# Patient Record
Sex: Female | Born: 1985 | Race: Black or African American | Hispanic: No | Marital: Single | Smoking: Never smoker
Health system: Southern US, Community
[De-identification: ages and names within clinical notes are randomized; demographics above are authoritative.]

## PROBLEM LIST (undated history)

## (undated) ENCOUNTER — Inpatient Hospital Stay (HOSPITAL_COMMUNITY): Payer: Self-pay

## (undated) DIAGNOSIS — Z789 Other specified health status: Secondary | ICD-10-CM

## (undated) DIAGNOSIS — I341 Nonrheumatic mitral (valve) prolapse: Secondary | ICD-10-CM

## (undated) DIAGNOSIS — Z3490 Encounter for supervision of normal pregnancy, unspecified, unspecified trimester: Secondary | ICD-10-CM

## (undated) DIAGNOSIS — D649 Anemia, unspecified: Secondary | ICD-10-CM

## (undated) DIAGNOSIS — O361990 Maternal care for other isoimmunization, unspecified trimester, not applicable or unspecified: Secondary | ICD-10-CM

## (undated) DIAGNOSIS — I4891 Unspecified atrial fibrillation: Secondary | ICD-10-CM

## (undated) DIAGNOSIS — Q210 Ventricular septal defect: Secondary | ICD-10-CM

## (undated) DIAGNOSIS — D696 Thrombocytopenia, unspecified: Secondary | ICD-10-CM

## (undated) HISTORY — DX: Anemia, unspecified: D64.9

## (undated) HISTORY — DX: Unspecified atrial fibrillation: I48.91

---

## 2012-08-11 ENCOUNTER — Emergency Department (HOSPITAL_COMMUNITY)
Admission: EM | Admit: 2012-08-11 | Discharge: 2012-08-11 | Disposition: A | Discharge status: Home / Self Care | Payer: Medicaid Other | Attending: Emergency Medicine | Admitting: Emergency Medicine

## 2012-08-11 ENCOUNTER — Encounter (HOSPITAL_COMMUNITY): Payer: Self-pay

## 2012-08-11 DIAGNOSIS — S51809A Unspecified open wound of unspecified forearm, initial encounter: Secondary | ICD-10-CM | POA: Insufficient documentation

## 2012-08-11 DIAGNOSIS — S51819A Laceration without foreign body of unspecified forearm, initial encounter: Secondary | ICD-10-CM

## 2012-08-11 HISTORY — DX: Nonrheumatic mitral (valve) prolapse: I34.1

## 2012-08-11 MED ORDER — LIDOCAINE HCL (PF) 1 % IJ SOLN
INTRAMUSCULAR | Status: AC
  Filled 2012-08-11: qty 5

## 2012-08-11 MED ORDER — LIDOCAINE-EPINEPHRINE 2 %-1:100000 IJ SOLN
20.0000 mL | Freq: Once | INTRAMUSCULAR | Status: AC
  Administered 2012-08-11: 20 mL via INTRADERMAL
  Filled 2012-08-11: qty 20

## 2012-08-11 NOTE — ED Notes (Signed)
D/c papers given to patient. [employer] asked [place] to hold pt until they talk to her again.

## 2012-08-11 NOTE — ED Notes (Signed)
MD at bedside to suture.

## 2012-08-11 NOTE — ED Notes (Signed)
Antibiotic ointment applied and wrapped with kling. [employer] at bedside
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
## 2012-08-11 NOTE — ED Provider Notes (Signed)
Medical screening examination/treatment/procedure(s) were conducted as a shared visit with non-physician practitioner(s) and myself.  I personally evaluated the patient during the encounter   Celene Kras, MD 08/11/12 1100

## 2012-08-11 NOTE — ED Provider Notes (Signed)
  Physical Exam  BP 137/101  Pulse 73  Temp 97.6 F (36.4 C) (Oral)  Resp 20  SpO2 100%  LMP 07/18/2012  Physical Exam  ED Course  Procedures  LACERATION REPAIR Performed by: Carlyle Dolly Authorized by: Carlyle Dolly Consent: Verbal consent obtained. Risks and benefits: risks, benefits and alternatives were discussed Consent given by: patient Patient identity confirmed: provided demographic data Prepped and Draped in normal sterile fashion Wound explored  Laceration Location: R forearm  Laceration Length: 3.5cm  No Foreign Bodies seen or palpated  Anesthesia: local infiltration  Local anesthetic: lidocaine 2% w epinephrine  Anesthetic total: 6ml  Irrigation method: syringe Amount of cleaning: standard  Skin closure: 4-0 Prolene   Number of sutures: 7  Technique: Simple Interrupted  Patient tolerance: Patient tolerated the procedure well with no immediate complications.       Carlyle Dolly, PA-C 08/11/12 1056
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
## 2012-08-23 ENCOUNTER — Encounter (HOSPITAL_COMMUNITY): Payer: Self-pay | Admitting: Emergency Medicine

## 2012-08-23 ENCOUNTER — Emergency Department (HOSPITAL_COMMUNITY)
Admission: EM | Admit: 2012-08-23 | Discharge: 2012-08-23 | Disposition: A | Discharge status: Home / Self Care | Payer: Medicaid Other | Attending: Emergency Medicine | Admitting: Emergency Medicine

## 2012-08-23 DIAGNOSIS — S51819A Laceration without foreign body of unspecified forearm, initial encounter: Secondary | ICD-10-CM

## 2012-08-23 DIAGNOSIS — X58XXXA Exposure to other specified factors, initial encounter: Secondary | ICD-10-CM | POA: Insufficient documentation

## 2012-08-23 DIAGNOSIS — S51809A Unspecified open wound of unspecified forearm, initial encounter: Secondary | ICD-10-CM | POA: Insufficient documentation

## 2012-08-23 DIAGNOSIS — Z4802 Encounter for removal of sutures: Secondary | ICD-10-CM

## 2012-08-23 DIAGNOSIS — I059 Rheumatic mitral valve disease, unspecified: Secondary | ICD-10-CM | POA: Insufficient documentation
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
## 2012-08-23 NOTE — ED Notes (Signed)
Needs sutures out rt forearm areaa well healed no s/s of infection

## 2012-08-25 NOTE — ED Provider Notes (Signed)
History    history per patient. Patient presents for suture removal of her right forearm. Sutures placed 08/11/2012 after laceration. Sutures are placed here in the emergency room. No history of fever or drainage spreading redness. No further pain. No other modifying factors identified. Patient taking no other medications. No other risk factors identified.   CSN: [id number]  Arrival date & time 08/23/12  1223   First MD Initiated Contact with Patient 08/23/12 1252      Chief Complaint  Patient presents with  . Suture / Staple Removal    (Consider location/radiation/quality/duration/timing/severity/associated sxs/prior treatment) HPI  Past Medical History  Diagnosis Date  . MVP (mitral valve prolapse)     No past surgical history on file.  No family history on file.  History  Substance Use Topics  . Smoking status: Never Smoker   . Smokeless tobacco: Not on file  . Alcohol Use: No    OB History    Grav Para Term Preterm Abortions TAB SAB Ect Mult Living                  Review of Systems  All other systems reviewed and are negative.    Allergies  Review of patient's allergies indicates no known allergies.  Home Medications  No current outpatient prescriptions on file.  BP 112/53  Pulse 65  Temp 98.4 F (36.9 C) (Oral)  Resp 12  SpO2 98%  LMP 07/18/2012  Physical Exam  Constitutional: She is oriented to person, place, and time. She appears well-developed and well-nourished.  HENT:  Head: Normocephalic.  Right Ear: External ear normal.  Left Ear: External ear normal.  Nose: Nose normal.  Mouth/Throat: Oropharynx is clear and moist.  Eyes: EOM are normal. Pupils are equal, round, and reactive to light. Right eye exhibits no discharge. Left eye exhibits no discharge.  Neck: Normal range of motion. Neck supple. No tracheal deviation present.       No nuchal rigidity no meningeal signs  Cardiovascular: Normal rate and regular rhythm.   Pulmonary/Chest:  Effort normal and breath sounds normal. No stridor. No respiratory distress. She has no wheezes. She has no rales.  Abdominal: Soft. She exhibits no distension and no mass. There is no tenderness. There is no rebound and no guarding.  Musculoskeletal: Normal range of motion. She exhibits no edema and no tenderness.       6 sutures present in right forearm no induration fluctuance tenderness  Neurological: She is alert and oriented to person, place, and time. She has normal reflexes. No cranial nerve deficit. Coordination normal.  Skin: Skin is warm. No rash noted. She is not diaphoretic. No erythema. No pallor.       No pettechia no purpura    ED Course  Procedures (including critical care time)  Labs Reviewed - No data to display No results found.   1. Visit for suture removal   2. Forearm laceration       MDM  Sutures removed per my note. No induration fluctuance tenderness spreading erythema or history of fever to suggest infection we'll discharge home patient agrees with plan.  Chart reviewed from past visist SUTURE REMOVAL Performed by: Arley Phenix  Consent: Verbal consent obtained. Patient identity confirmed: provided demographic data Time out: Immediately prior to procedure a "time out" was called to verify the correct patient, procedure, equipment, support staff and site/side marked as required.  Location details: rigth forearm  Wound Appearance: clean  Sutures/Staples Removed: 6  Facility: sutures placed  in this facility Patient tolerance: Patient tolerated the procedure well with no immediate complications.          Arley Phenix, MD 08/25/12 [phone]

## 2012-12-14 ENCOUNTER — Emergency Department (HOSPITAL_COMMUNITY)
Admission: EM | Admit: 2012-12-14 | Discharge: 2012-12-14 | Disposition: A | Discharge status: Home / Self Care | Payer: Medicaid Other | Attending: Emergency Medicine | Admitting: Emergency Medicine

## 2012-12-14 ENCOUNTER — Encounter (HOSPITAL_COMMUNITY): Payer: Self-pay | Admitting: Emergency Medicine

## 2012-12-14 DIAGNOSIS — L299 Pruritus, unspecified: Secondary | ICD-10-CM | POA: Insufficient documentation

## 2012-12-14 DIAGNOSIS — L42 Pityriasis rosea: Secondary | ICD-10-CM | POA: Insufficient documentation

## 2012-12-14 DIAGNOSIS — Z8679 Personal history of other diseases of the circulatory system: Secondary | ICD-10-CM | POA: Insufficient documentation

## 2012-12-14 NOTE — ED Provider Notes (Signed)
Medical screening examination/treatment/procedure(s) were performed by non-physician practitioner and as supervising physician I was immediately available for consultation/collaboration.  Doug Sou, MD 12/14/12 [phone]

## 2012-12-14 NOTE — ED Notes (Signed)
Voiced understanding of instructions given 

## 2012-12-14 NOTE — ED Notes (Signed)
States that she began having rash break out 3 days ago and complains of itching. States that it is all over her body. Kids present with her and she states that they do not have symptoms.

## 2012-12-14 NOTE — ED Provider Notes (Signed)
History     CSN: [id number]  Arrival date & time 12/14/12  1109   First MD Initiated Contact with Patient 12/14/12 1115      Chief Complaint  Patient presents with  . Rash    (Consider location/radiation/quality/duration/timing/severity/associated sxs/prior treatment) HPI Comments: Patient reports that she first noticed a large scaly area on her chest.  A couple of days later she developed additional smaller scaly areas on the trunk, upper legs, and upper arms.  No contacts with similar rash.    Patient is a 27 y.o. female presenting with rash. The history is provided by the patient.  Rash  This is a new problem. Episode onset: 3 days ago. The problem has been gradually worsening. Associated with: No new soaps, detergents, medications, or lotions. There has been no fever. Associated symptoms include itching. Pertinent negatives include no blisters, no pain and no weeping. Treatments tried: calamine lotion.    Past Medical History  Diagnosis Date  . MVP (mitral valve prolapse)     History reviewed. No pertinent past surgical history.  No family history on file.  History  Substance Use Topics  . Smoking status: Never Smoker   . Smokeless tobacco: Not on file  . Alcohol Use: No    OB History    Grav Para Term Preterm Abortions TAB SAB Ect Mult Living                  Review of Systems  Constitutional: Negative for fever and chills.  Gastrointestinal: Negative for nausea and vomiting.  Skin: Positive for itching and rash.  Neurological: Negative for headaches.  All other systems reviewed and are negative.    Allergies  Review of patient's allergies indicates no known allergies.  Home Medications  No current outpatient prescriptions on file.  BP 109/64  Pulse 82  Temp 98.4 F (36.9 C) (Oral)  Resp 17  SpO2 100%  LMP 11/10/2012  Physical Exam  Nursing note and vitals reviewed. Constitutional: She appears well-developed and well-nourished. No distress.    HENT:  Head: Normocephalic and atraumatic.  Mouth/Throat: Oropharynx is clear and moist.  Neck: Normal range of motion. Neck supple.  Cardiovascular: Normal rate, regular rhythm and normal heart sounds.   Pulmonary/Chest: Effort normal and breath sounds normal.  Musculoskeletal: Normal range of motion.  Neurological: She is alert.  Skin: Skin is warm and dry. Rash noted. She is not diaphoretic.          Erythematous scaly papules located on the trunk and the upper arms and the upper legs bilaterally  Psychiatric: She has a normal mood and affect.    ED Course  Procedures (including critical care time)  Labs Reviewed - No data to display No results found.   1. Pityriasis rosea       MDM  Appearance of the rash consistent with Pityriasis Rosea.  Patient instructed to take Benadryl for the itching.  Return precautions given.        Pascal Lux [place], PA-C 12/14/12 1528

## 2013-02-07 ENCOUNTER — Emergency Department (HOSPITAL_COMMUNITY)
Admission: EM | Admit: 2013-02-07 | Discharge: 2013-02-07 | Disposition: A | Discharge status: Home / Self Care | Payer: Medicaid Other | Attending: Emergency Medicine | Admitting: Emergency Medicine

## 2013-02-07 ENCOUNTER — Encounter (HOSPITAL_COMMUNITY): Payer: Self-pay | Admitting: Emergency Medicine

## 2013-02-07 ENCOUNTER — Emergency Department (HOSPITAL_COMMUNITY): Payer: Medicaid Other

## 2013-02-07 DIAGNOSIS — X503XXA Overexertion from repetitive movements, initial encounter: Secondary | ICD-10-CM | POA: Insufficient documentation

## 2013-02-07 DIAGNOSIS — Y92009 Unspecified place in unspecified non-institutional (private) residence as the place of occurrence of the external cause: Secondary | ICD-10-CM | POA: Insufficient documentation

## 2013-02-07 DIAGNOSIS — R05 Cough: Secondary | ICD-10-CM | POA: Insufficient documentation

## 2013-02-07 DIAGNOSIS — M940 Chondrocostal junction syndrome [Tietze]: Secondary | ICD-10-CM | POA: Insufficient documentation

## 2013-02-07 DIAGNOSIS — R059 Cough, unspecified: Secondary | ICD-10-CM | POA: Insufficient documentation

## 2013-02-07 DIAGNOSIS — Y9389 Activity, other specified: Secondary | ICD-10-CM | POA: Insufficient documentation

## 2013-02-07 DIAGNOSIS — Z8679 Personal history of other diseases of the circulatory system: Secondary | ICD-10-CM | POA: Insufficient documentation

## 2013-02-07 MED ORDER — HYDROCODONE-ACETAMINOPHEN 5-325 MG PO TABS: 1.0000 | ORAL_TABLET | Freq: Three times a day (TID) | ORAL | Status: DC | PRN

## 2013-02-07 NOTE — ED Provider Notes (Signed)
Medical screening examination/treatment/procedure(s) were performed by non-physician practitioner and as supervising physician I was immediately available for consultation/collaboration.   Carleene Cooper III, MD 02/07/13 2115

## 2013-02-07 NOTE — ED Provider Notes (Signed)
History     CSN: [id number]  Arrival date & time 02/07/13  0945   First MD Initiated Contact with Patient 02/07/13 1004      Chief Complaint  Patient presents with  . Rib Injury    (Consider location/radiation/quality/duration/timing/severity/associated sxs/prior treatment) HPI  Patient presents to the ED with complaints of left lower rib pain. She was moving heavy boxes in her house when the pain began. Last night the pain intensified and now it hurts to cough or take a big breath.  Denies having any fevers, cough, nasal congestion. nad vss  Past Medical History  Diagnosis Date  . MVP (mitral valve prolapse)     History reviewed. No pertinent past surgical history.  No family history on file.  History  Substance Use Topics  . Smoking status: Never Smoker   . Smokeless tobacco: Not on file  . Alcohol Use: No    OB History   Grav Para Term Preterm Abortions TAB SAB Ect Mult Living                  Review of Systems  All other systems reviewed and are negative.    Allergies  Review of patient's allergies indicates no known allergies.  Home Medications   Current Outpatient Rx  Name  Route  Sig  Dispense  Refill  . HYDROcodone-acetaminophen (NORCO/VICODIN) 5-325 MG per tablet   Oral   Take 1 tablet by mouth every 8 (eight) hours as needed for pain.   6 tablet   0     BP 143/83  Pulse 76  Temp(Src) 98.5 F (36.9 C) (Oral)  Resp 16  SpO2 99%  LMP 01/11/2013  Physical Exam  Nursing note and vitals reviewed. Constitutional: She appears well-developed and well-nourished. No distress.  HENT:  Head: Normocephalic and atraumatic.  Eyes: Pupils are equal, round, and reactive to light.  Neck: Normal range of motion. Neck supple.  Cardiovascular: Normal rate and regular rhythm.   Pulmonary/Chest: Effort normal. Chest wall is not dull to percussion. She exhibits tenderness and edema. She exhibits no mass, no bony tenderness, no laceration, no crepitus, no  deformity, no swelling and no retraction.    Abdominal: Soft.  Neurological: She is alert.  Skin: Skin is warm and dry.    ED Course  Procedures (including critical care time)  Labs Reviewed - No data to display Dg Ribs Unilateral W/chest Left  02/07/2013  *RADIOLOGY REPORT*  Clinical Data: Left-sided pain  LEFT RIBS AND CHEST - 3+ VIEW  Comparison: None.  Findings: Three views left ribs submitted.  No acute infiltrate or pulmonary edema.  No left rib fracture is identified.  No diagnostic pneumothorax. Mild dextroscoliosis thoracic spine.  IMPRESSION: No left rib fracture is identified.  No diagnostic pneumothorax.   Original Report Authenticated By: Natasha Mead, M.D.      1. Costochondritis       MDM  Xray shows in abnormality with the bone or lung. Symptoms are consistent with a muscle pull and costochondritis. I discussed with patient that these symptoms can last from 1-8 weeks. Recommend ice/heat packs, stretching, Ibuprofen. Referral to Ortho given. Small Rx for pain meds.  Pt has been advised of the symptoms that warrant their return to the ED. Patient has voiced understanding and has agreed to follow-up with the PCP or specialist.        Dorthula Matas, PA-C 02/07/13 1114

## 2013-02-07 NOTE — ED Notes (Signed)
Onset one day ago lift heavy boxes furniture and developed left rib pain. Airway intact bilateral equal chest rise and fall.

## 2013-05-31 ENCOUNTER — Encounter (HOSPITAL_COMMUNITY): Payer: Self-pay | Admitting: *Deleted

## 2013-05-31 DIAGNOSIS — E876 Hypokalemia: Secondary | ICD-10-CM | POA: Insufficient documentation

## 2013-05-31 DIAGNOSIS — D696 Thrombocytopenia, unspecified: Secondary | ICD-10-CM | POA: Insufficient documentation

## 2013-05-31 DIAGNOSIS — R110 Nausea: Secondary | ICD-10-CM | POA: Insufficient documentation

## 2013-05-31 DIAGNOSIS — I059 Rheumatic mitral valve disease, unspecified: Secondary | ICD-10-CM | POA: Insufficient documentation

## 2013-05-31 DIAGNOSIS — Z3201 Encounter for pregnancy test, result positive: Secondary | ICD-10-CM | POA: Insufficient documentation

## 2013-05-31 DIAGNOSIS — R51 Headache: Secondary | ICD-10-CM | POA: Insufficient documentation

## 2013-05-31 LAB — COMPREHENSIVE METABOLIC PANEL
ALT: 11 U/L (ref 0–35)
Alkaline Phosphatase: 44 U/L (ref 39–117)
BUN: 8 mg/dL (ref 6–23)
CO2: 29 mEq/L (ref 19–32)
Chloride: 104 mEq/L (ref 96–112)
GFR calc Af Amer: >90 mL/min (ref >90)
Glucose, Bld: 87 mg/dL (ref 70–99)
Potassium: 3.2 mEq/L — ABNORMAL LOW (ref 3.5–5.1)
Sodium: 139 mEq/L (ref 135–145)
Total Bilirubin: 0.5 mg/dL (ref 0.3–1.2)

## 2013-05-31 LAB — POCT PREGNANCY, URINE: Preg Test, Ur: POSITIVE — AB

## 2013-05-31 LAB — URINE MICROSCOPIC-ADD ON

## 2013-05-31 LAB — CBC WITH DIFFERENTIAL/PLATELET
Hemoglobin: 12.5 g/dL (ref 12.0–15.0)
Lymphocytes Relative: 38 % (ref 12–46)
Lymphs Abs: 3.2 10*3/uL (ref 0.7–4.0)
MCH: 30 pg (ref 26.0–34.0)
Monocytes Relative: 8 % (ref 3–12)
Neutro Abs: 4.5 10*3/uL (ref 1.7–7.7)
Neutrophils Relative %: 53 % (ref 43–77)
RBC: 4.16 MIL/uL (ref 3.87–5.11)
WBC: 8.4 10*3/uL (ref 4.0–10.5)

## 2013-05-31 LAB — URINALYSIS, ROUTINE W REFLEX MICROSCOPIC: Urobilinogen, UA: 1 mg/dL (ref 0.0–1.0)

## 2013-05-31 NOTE — ED Notes (Signed)
Pt states intermittant lower abdominal pain that is sharp. Pt statse also HA that have been intermittant as well for 4 days. Pt denies vaginal bleeding, urinary problems or bowel movement problems.

## 2013-06-01 ENCOUNTER — Emergency Department (HOSPITAL_COMMUNITY)
Admission: EM | Admit: 2013-06-01 | Discharge: 2013-06-01 | Disposition: A | Discharge status: Home / Self Care | Payer: Medicaid Other | Attending: Emergency Medicine | Admitting: Emergency Medicine

## 2013-06-01 DIAGNOSIS — D696 Thrombocytopenia, unspecified: Secondary | ICD-10-CM

## 2013-06-01 DIAGNOSIS — Z3490 Encounter for supervision of normal pregnancy, unspecified, unspecified trimester: Secondary | ICD-10-CM

## 2013-06-01 DIAGNOSIS — E876 Hypokalemia: Secondary | ICD-10-CM

## 2013-06-01 DIAGNOSIS — R110 Nausea: Secondary | ICD-10-CM

## 2013-06-01 MED ORDER — POTASSIUM CHLORIDE CRYS ER 20 MEQ PO TBCR
40.0000 meq | EXTENDED_RELEASE_TABLET | Freq: Once | ORAL | Status: AC
  Administered 2013-06-01: 40 meq via ORAL
  Filled 2013-06-01: qty 2

## 2013-06-01 NOTE — ED Provider Notes (Signed)
History    CSN: [id number] Arrival date & time 05/31/13  2008  First MD Initiated Contact with Patient 06/01/13 0008     Chief Complaint  Patient presents with  . Abdominal Pain  . Headache   (Consider location/radiation/quality/duration/timing/severity/associated sxs/prior Treatment) HPI This patient is a 27 year old generally healthy gravida 3 para 3 female who presents with complaints of intermittent diffuse lower abdominal cramping. She says that her symptoms feel like those that she experienced with previous normal pregnancies. She came to the emergency department hoping that she was not pregnant but, ultimately to get a pregnancy test because she thinks she might be. Her last menstrual period was June 5. She denies any vaginal bleeding, unusual vaginal discharge, history of ectopic pregnancy.  The patient currently symptom-free. She has intermittent nausea but denies vomiting. She feels that her by mouth intake is mildly dehydrated. However she is able to tolerate by mouth intake without difficulty.  She denies any genitourinary symptoms Past Medical History  Diagnosis Date  . MVP (mitral valve prolapse)    History reviewed. No pertinent past surgical history. History reviewed. No pertinent family history. History  Substance Use Topics  . Smoking status: Never Smoker   . Smokeless tobacco: Not on file  . Alcohol Use: No   OB History   Grav Para Term Preterm Abortions TAB SAB Ect Mult Living                 Review of Systems Gen: no weight loss, fevers, chills, night sweats Eyes: no discharge or drainage, no occular pain or visual changes Nose: no epistaxis or rhinorrhea Mouth: no dental pain, no sore throat Neck: no neck pain Lungs: no SOB, cough, wheezing CV: no chest pain, palpitations, dependent edema or orthopnea Abd: As per history of present illness, otherwise negative GU: no dysuria or gross hematuria MSK: no myalgias or arthralgias Neuro: no headache, no  focal neurologic deficits Skin: no rash Psyche: negative.  Allergies  Review of patient's allergies indicates no known allergies.  Home Medications  No current outpatient prescriptions on file. BP 106/71  Pulse 64  Temp(Src) 99.5 F (37.5 C) (Oral)  Resp 16  SpO2 97%  LMP 04/18/2013 Physical Exam Gen: well developed and well nourished appearing Head: NCAT Eyes: PERL, EOMI Nose: no epistaixis or rhinorrhea Mouth/throat: mucosa is moist and pink Neck: supple, no stridor Lungs: CTA B, no wheezing, rhonchi or rales CV-regular rate and rhythm, no murmur Abd: soft, notender, nondistended Back: no ttp, no cva ttp Skin: no rashese, wnl Neuro: CN ii-xii grossly intact, no focal deficits Psyche; normal affect,  calm and cooperative.   ED Course  Procedures (including critical care time) Labs Reviewed  CBC WITH DIFFERENTIAL - Abnormal; Notable for the following:    Platelets 135 (*)    All other components within normal limits  COMPREHENSIVE METABOLIC PANEL - Abnormal; Notable for the following:    Potassium 3.2 (*)    All other components within normal limits  URINALYSIS, ROUTINE W REFLEX MICROSCOPIC - Abnormal; Notable for the following:    APPearance CLOUDY (*)    Bilirubin Urine SMALL (*)    Ketones, ur 15 (*)    Leukocytes, UA SMALL (*)    All other components within normal limits  URINE MICROSCOPIC-ADD ON - Abnormal; Notable for the following:    Squamous Epithelial / LPF MANY (*)    All other components within normal limits  POCT PREGNANCY, URINE - Abnormal; Notable for the following:  Preg Test, Ur POSITIVE (*)    All other components within normal limits   No results found. 1. Pregnancy   2. Thrombocytopenia   3. Hypokalemia   4. Nausea     MDM  Patient with incidental finding of pregnancy which is first trimester according to her LMP of 04/19/2013. The patient is adamant that she wishes to terminate this pregnancy. At her request, I have given her contact  information for the local [facility] office. The patient is noted to be mildly thrombocytopenic and says that this is a chronic condition. She is also noted to be hypo-anemic with a potassium of 3.2. Her potassium is being supplemented in the emergency department.  I do not believe that the patient needs to be studied for ectopic pregnancy as she clearly presented to have a pregnancy test and is not having symptoms suggestive of concerning for ectopic pregnancy. Patient is stable for discharge.  Brandt Loosen, MD 06/01/13 [phone]

## 2013-12-26 ENCOUNTER — Encounter (HOSPITAL_COMMUNITY): Payer: Self-pay | Admitting: General Practice

## 2013-12-26 ENCOUNTER — Inpatient Hospital Stay (HOSPITAL_COMMUNITY)
Admission: AD | Admit: 2013-12-26 | Discharge: 2014-01-02 | DRG: 765 | Disposition: A | Discharge status: Home / Self Care | Payer: Medicaid Other | Source: Ambulatory Visit | Attending: Obstetrics & Gynecology | Admitting: Obstetrics & Gynecology

## 2013-12-26 ENCOUNTER — Observation Stay (HOSPITAL_COMMUNITY): Payer: Medicaid Other

## 2013-12-26 DIAGNOSIS — I2510 Atherosclerotic heart disease of native coronary artery without angina pectoris: Secondary | ICD-10-CM | POA: Diagnosis present

## 2013-12-26 DIAGNOSIS — I059 Rheumatic mitral valve disease, unspecified: Secondary | ICD-10-CM | POA: Diagnosis present

## 2013-12-26 DIAGNOSIS — I871 Compression of vein: Secondary | ICD-10-CM | POA: Diagnosis present

## 2013-12-26 DIAGNOSIS — N9089 Other specified noninflammatory disorders of vulva and perineum: Secondary | ICD-10-CM | POA: Diagnosis present

## 2013-12-26 DIAGNOSIS — Z3490 Encounter for supervision of normal pregnancy, unspecified, unspecified trimester: Secondary | ICD-10-CM

## 2013-12-26 DIAGNOSIS — N7689 Other specified inflammation of vagina and vulva: Secondary | ICD-10-CM

## 2013-12-26 DIAGNOSIS — O9912 Other diseases of the blood and blood-forming organs and certain disorders involving the immune mechanism complicating childbirth: Secondary | ICD-10-CM

## 2013-12-26 DIAGNOSIS — Z862 Personal history of diseases of the blood and blood-forming organs and certain disorders involving the immune mechanism: Secondary | ICD-10-CM | POA: Diagnosis present

## 2013-12-26 DIAGNOSIS — I319 Disease of pericardium, unspecified: Secondary | ICD-10-CM | POA: Diagnosis present

## 2013-12-26 DIAGNOSIS — O0930 Supervision of pregnancy with insufficient antenatal care, unspecified trimester: Secondary | ICD-10-CM

## 2013-12-26 DIAGNOSIS — E860 Dehydration: Secondary | ICD-10-CM | POA: Diagnosis present

## 2013-12-26 DIAGNOSIS — I890 Lymphedema, not elsewhere classified: Secondary | ICD-10-CM | POA: Diagnosis present

## 2013-12-26 DIAGNOSIS — O34219 Maternal care for unspecified type scar from previous cesarean delivery: Secondary | ICD-10-CM

## 2013-12-26 DIAGNOSIS — Z98891 History of uterine scar from previous surgery: Secondary | ICD-10-CM

## 2013-12-26 DIAGNOSIS — D696 Thrombocytopenia, unspecified: Secondary | ICD-10-CM | POA: Diagnosis present

## 2013-12-26 DIAGNOSIS — E8809 Other disorders of plasma-protein metabolism, not elsewhere classified: Secondary | ICD-10-CM | POA: Diagnosis present

## 2013-12-26 DIAGNOSIS — D649 Anemia, unspecified: Secondary | ICD-10-CM | POA: Diagnosis present

## 2013-12-26 DIAGNOSIS — O2200 Varicose veins of lower extremity in pregnancy, unspecified trimester: Secondary | ICD-10-CM | POA: Diagnosis present

## 2013-12-26 DIAGNOSIS — O9902 Anemia complicating childbirth: Secondary | ICD-10-CM | POA: Diagnosis present

## 2013-12-26 DIAGNOSIS — O99119 Other diseases of the blood and blood-forming organs and certain disorders involving the immune mechanism complicating pregnancy, unspecified trimester: Secondary | ICD-10-CM | POA: Diagnosis present

## 2013-12-26 DIAGNOSIS — O99892 Other specified diseases and conditions complicating childbirth: Secondary | ICD-10-CM | POA: Diagnosis present

## 2013-12-26 DIAGNOSIS — O1203 Gestational edema, third trimester: Secondary | ICD-10-CM

## 2013-12-26 DIAGNOSIS — M7989 Other specified soft tissue disorders: Secondary | ICD-10-CM

## 2013-12-26 DIAGNOSIS — D689 Coagulation defect, unspecified: Secondary | ICD-10-CM | POA: Diagnosis present

## 2013-12-26 DIAGNOSIS — N72 Inflammatory disease of cervix uteri: Secondary | ICD-10-CM

## 2013-12-26 DIAGNOSIS — O9942 Diseases of the circulatory system complicating childbirth: Secondary | ICD-10-CM

## 2013-12-26 DIAGNOSIS — O1414 Severe pre-eclampsia complicating childbirth: Principal | ICD-10-CM | POA: Diagnosis present

## 2013-12-26 DIAGNOSIS — O9989 Other specified diseases and conditions complicating pregnancy, childbirth and the puerperium: Secondary | ICD-10-CM

## 2013-12-26 DIAGNOSIS — Z331 Pregnant state, incidental: Secondary | ICD-10-CM

## 2013-12-26 LAB — URINALYSIS, ROUTINE W REFLEX MICROSCOPIC
BILIRUBIN URINE: NEGATIVE
Glucose, UA: NEGATIVE mg/dL
KETONES UR: NEGATIVE mg/dL
Leukocytes, UA: NEGATIVE
NITRITE: NEGATIVE
Protein, ur: >300 mg/dL — AB
UROBILINOGEN UA: 0.2 mg/dL (ref 0.0–1.0)
pH: 6 (ref 5.0–8.0)

## 2013-12-26 LAB — URINE MICROSCOPIC-ADD ON

## 2013-12-26 LAB — COMPREHENSIVE METABOLIC PANEL
ALK PHOS: 164 U/L — AB (ref 39–117)
ALT: 6 U/L (ref 0–35)
AST: 16 U/L (ref 0–37)
Albumin: 1.6 g/dL — ABNORMAL LOW (ref 3.5–5.2)
BUN: 10 mg/dL (ref 6–23)
CALCIUM: 7.8 mg/dL — AB (ref 8.4–10.5)
CO2: 23 meq/L (ref 19–32)
Chloride: 104 mEq/L (ref 96–112)
Creatinine, Ser: 0.86 mg/dL (ref 0.50–1.10)
GFR calc non Af Amer: >90 mL/min (ref >90)
GLUCOSE: 64 mg/dL — AB (ref 70–99)
POTASSIUM: 4 meq/L (ref 3.7–5.3)
SODIUM: 138 meq/L (ref 137–147)
TOTAL PROTEIN: 5.4 g/dL — AB (ref 6.0–8.3)
Total Bilirubin: 0.3 mg/dL (ref 0.3–1.2)

## 2013-12-26 LAB — ABO/RH: ABO/RH(D): B POS

## 2013-12-26 LAB — DIC (DISSEMINATED INTRAVASCULAR COAGULATION)PANEL
Fibrinogen: 287 mg/dL (ref 204–475)
Prothrombin Time: 13.4 seconds (ref 11.6–15.2)
aPTT: 31 seconds (ref 24–37)

## 2013-12-26 LAB — DIFFERENTIAL
BASOS PCT: 0 % (ref 0–1)
Basophils Absolute: 0 10*3/uL (ref 0.0–0.1)
Eosinophils Absolute: 0 10*3/uL (ref 0.0–0.7)
Eosinophils Relative: 0 % (ref 0–5)
LYMPHS ABS: 2.9 10*3/uL (ref 0.7–4.0)
LYMPHS PCT: 34 % (ref 12–46)
MONO ABS: 0.7 10*3/uL (ref 0.1–1.0)
MONOS PCT: 8 % (ref 3–12)
NEUTROS ABS: 4.9 10*3/uL (ref 1.7–7.7)
NEUTROS PCT: 58 % (ref 43–77)

## 2013-12-26 LAB — CBC
HEMATOCRIT: 27.1 % — AB (ref 36.0–46.0)
HEMOGLOBIN: 9 g/dL — AB (ref 12.0–15.0)
MCH: 28 pg (ref 26.0–34.0)
MCHC: 33.2 g/dL (ref 30.0–36.0)
MCV: 84.4 fL (ref 78.0–100.0)
Platelets: 115 10*3/uL — ABNORMAL LOW (ref 150–400)
RBC: 3.21 MIL/uL — AB (ref 3.87–5.11)
RDW: 14.6 % (ref 11.5–15.5)
WBC: 8.5 10*3/uL (ref 4.0–10.5)

## 2013-12-26 LAB — RAPID URINE DRUG SCREEN, HOSP PERFORMED
AMPHETAMINES: NOT DETECTED
BARBITURATES: NOT DETECTED
BENZODIAZEPINES: NOT DETECTED
COCAINE: NOT DETECTED
OPIATES: NOT DETECTED
TETRAHYDROCANNABINOL: POSITIVE — AB

## 2013-12-26 LAB — HEPATITIS B SURFACE ANTIGEN: Hepatitis B Surface Ag: NEGATIVE

## 2013-12-26 LAB — DIC (DISSEMINATED INTRAVASCULAR COAGULATION) PANEL
D DIMER QUANT: 6.53 ug{FEU}/mL — AB (ref 0.00–0.48)
INR: 1.04 (ref 0.00–1.49)
PLATELETS: 115 10*3/uL — AB (ref 150–400)
SMEAR REVIEW: NONE SEEN

## 2013-12-26 LAB — RPR: RPR: NONREACTIVE

## 2013-12-26 LAB — HIV ANTIBODY (ROUTINE TESTING W REFLEX): HIV: NONREACTIVE

## 2013-12-26 MED ORDER — BETAMETHASONE SOD PHOS & ACET 6 (3-3) MG/ML IJ SUSP
12.0000 mg | INTRAMUSCULAR | Status: AC
  Administered 2013-12-27: 12 mg via INTRAMUSCULAR
  Filled 2013-12-26 (×2): qty 2

## 2013-12-26 MED ORDER — DOCUSATE SODIUM 100 MG PO CAPS
100.0000 mg | ORAL_CAPSULE | Freq: Every day | ORAL | Status: DC
  Administered 2013-12-27 – 2013-12-28 (×2): 100 mg via ORAL
  Filled 2013-12-26 (×2): qty 1

## 2013-12-26 MED ORDER — ENOXAPARIN SODIUM 80 MG/0.8ML ~~LOC~~ SOLN
70.0000 mg | Freq: Two times a day (BID) | SUBCUTANEOUS | Status: DC
  Administered 2013-12-26 – 2013-12-27 (×3): 70 mg via SUBCUTANEOUS
  Filled 2013-12-26 (×4): qty 0.8

## 2013-12-26 MED ORDER — DEXTROSE-NACL 5-0.45 % IV SOLN
INTRAVENOUS | Status: DC
  Administered 2013-12-27 (×2): via INTRAVENOUS

## 2013-12-26 MED ORDER — ZOLPIDEM TARTRATE 5 MG PO TABS
5.0000 mg | ORAL_TABLET | Freq: Every evening | ORAL | Status: DC | PRN
  Administered 2013-12-27: 5 mg via ORAL
  Filled 2013-12-26: qty 1

## 2013-12-26 MED ORDER — BETAMETHASONE SOD PHOS & ACET 6 (3-3) MG/ML IJ SUSP
12.0000 mg | Freq: Once | INTRAMUSCULAR | Status: AC
  Administered 2013-12-26: 12 mg via INTRAMUSCULAR
  Filled 2013-12-26: qty 2

## 2013-12-26 MED ORDER — BETAMETHASONE SOD PHOS & ACET 6 (3-3) MG/ML IJ SUSP: 12.0000 mg | Freq: Once | INTRAMUSCULAR | Status: DC

## 2013-12-26 MED ORDER — ACETAMINOPHEN 325 MG PO TABS: 650.0000 mg | ORAL_TABLET | ORAL | Status: DC | PRN

## 2013-12-26 MED ORDER — OXYCODONE-ACETAMINOPHEN 5-325 MG PO TABS
2.0000 | ORAL_TABLET | Freq: Once | ORAL | Status: AC
  Administered 2013-12-26: 2 via ORAL
  Filled 2013-12-26: qty 2

## 2013-12-26 MED ORDER — OXYCODONE-ACETAMINOPHEN 5-325 MG PO TABS
1.0000 | ORAL_TABLET | Freq: Four times a day (QID) | ORAL | Status: DC | PRN
  Administered 2013-12-26: 2 via ORAL
  Filled 2013-12-26: qty 2

## 2013-12-26 MED ORDER — PRENATAL MULTIVITAMIN CH
1.0000 | ORAL_TABLET | Freq: Every day | ORAL | Status: DC
  Administered 2013-12-27 – 2013-12-28 (×2): 1 via ORAL
  Filled 2013-12-26 (×3): qty 1

## 2013-12-26 MED ORDER — CALCIUM CARBONATE ANTACID 500 MG PO CHEW: 2.0000 | CHEWABLE_TABLET | ORAL | Status: DC | PRN

## 2013-12-26 NOTE — MAU Note (Signed)
Pt presents to MAU with c/o vaginal swelling since yesterday. Pt states she started swelling yesterday and she put some warm water on it but when she woke up this morning the swelling was much worse. Upon visual assessment there appears to be a very large, grapefruit sized cyst appearing mass on labia/vulva. Pt is complaining of pain and requesting tylenol.  Pt is approximately [redacted] weeks pregnant. She states she has been in denial because she did not want a 4th child. She has had no prenatal care. She states she has has skin tearing on her ankle due to swelling. Upon visual assessment the skin on her shins down is tight and shining. She states she stands on her feet for 40hrs/week at her job.

## 2013-12-26 NOTE — Progress Notes (Signed)
  Echocardiogram 2D Echocardiogram has been performed.  Cathie BeamsGREGORY, Dinara Lupu 12/26/2013, 4:15 PM

## 2013-12-26 NOTE — H&P (Signed)
FACULTY PRACTICE ANTEPARTUM ADMISSION HISTORY AND PHYSICAL NOTE   History of Present Illness: Michele Maldonado is a 28 y.o. [phone] at Unknown EGA but by exam ~28 weeks admitted for extensive vulvar edema and inability to void. She c/o LE edema for several weeks that started in her feet and progressed to her thighs.  She reports that last night it progressed to her vulva and she has been unable to ambulate easily or void without difficulty.  She c/o sever pain in her vulva especially with palpation or movement. Pt with no prenatal care because she did not intend to keep the pregnancy.  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  She was 3 previous SVD with uncomplicated pregnancies.      Patient reports the fetal movement as active. Patient reports uterine contraction  activity as none. Patient reports  vaginal bleeding as none. Patient describes fluid per vagina as None. Fetal presentation is unsure.  There are no active problems to display for this patient.   Past Medical History  Diagnosis Date  . MVP (mitral valve prolapse)     History reviewed. No pertinent past surgical history.  OB History  Gravida Para Term Preterm AB SAB TAB Ectopic Multiple Living  4 3 3       3     # Outcome Date GA Lbr Len/2nd Weight Sex Delivery Anes PTL Lv  4 CUR           3 TRM 04/07/10    M SVD EPI  Y  2 TRM 12/20/08    M SVD EPI  Y  1 TRM 06/07/06    F SVD EPI  Y      History   Social History  . Marital Status: Single    Spouse Name: N/A    Number of Children: N/A  . Years of Education: N/A   Social History Main Topics  . Smoking status: Never Smoker   . Smokeless tobacco: None  . Alcohol Use: No  . Drug Use: No  . Sexual Activity: None   Other Topics Concern  . None   Social History Narrative  . None    History reviewed. No pertinent family history.  No Known Allergies  No prescriptions prior to  admission    Review of Systems - History obtained from the patient  Vitals:  BP 140/79  Pulse 71  Ht 5\' 7"  (1.702 m)  Wt 202 lb 8 oz (91.853 kg)  BMI 31.71 kg/m2  LMP 04/18/2013 Physical Examination: GENERAL: Well-developed, well-nourished female in no acute distress.  LUNGS: Clear to auscultation bilaterally.  HEART: Regular rate and rhythm. ABDOMEN: Soft, nontender, nondistended. No organomegaly; gravid EXTREMITIES: bilateral edema to her vulva.  The right calf is large than the left by >10cm (noted with measuring- not noted visually) 3+ pitting edema.  Tender to movement or palpation. GU: labia are extremely edematous. No hematoma noted. The left side of the mons is larger than the right.  A foley cath was placed and required 2 providers to place.  The vulva is quite tender to palpation. The mons pubis is edematous as well but, not as extensive.  Membranes:intact Fetal Monitoring:Baseline: 120's bpm, Variability: Good {> 6 bpm) and Accelerations: Reactive Tocometer: Flat  Labs:  Results for orders placed during the hospital encounter of 12/26/13 (from the past 24 hour(s))  COMPREHENSIVE METABOLIC PANEL   Collection Time    12/26/13  2:45 PM      Result Value Ref Range   Sodium 138  137 - 147 mEq/L   Potassium 4.0  3.7 - 5.3 mEq/L   Chloride 104  96 - 112 mEq/L   CO2 23  19 - 32 mEq/L   Glucose, Bld 64 (*) 70 - 99 mg/dL   BUN 10  6 - 23 mg/dL   Creatinine, Ser 4.09  0.50 - 1.10 mg/dL   Calcium 7.8 (*) 8.4 - 10.5 mg/dL   Total Protein 5.4 (*) 6.0 - 8.3 g/dL   Albumin 1.6 (*) 3.5 - 5.2 g/dL   AST 16  0 - 37 U/L   ALT 6  0 - 35 U/L   Alkaline Phosphatase 164 (*) 39 - 117 U/L   Total Bilirubin 0.3  0.3 - 1.2 mg/dL   GFR calc non Af Amer >90  >90 mL/min   GFR calc Af Amer >90  >90 mL/min  HEPATITIS B SURFACE ANTIGEN   Collection Time    12/26/13  2:45 PM      Result Value Ref Range   Hepatitis B Surface Ag NEGATIVE  NEGATIVE  RPR   Collection Time    12/26/13  2:45 PM       Result Value Ref Range   RPR NON REACTIVE  NON REACTIVE  CBC   Collection Time    12/26/13  2:45 PM      Result Value Ref Range   WBC 8.5  4.0 - 10.5 K/uL   RBC 3.21 (*) 3.87 - 5.11 MIL/uL   Hemoglobin 9.0 (*) 12.0 - 15.0 g/dL   HCT 81.1 (*) 91.4 - 78.2 %   MCV 84.4  78.0 - 100.0 fL   MCH 28.0  26.0 - 34.0 pg   MCHC 33.2  30.0 - 36.0 g/dL   RDW 95.6  21.3 - 08.6 %   Platelets 115 (*) 150 - 400 K/uL  DIFFERENTIAL   Collection Time    12/26/13  2:45 PM      Result Value Ref Range   Neutrophils Relative % 58  43 - 77 %   Neutro Abs 4.9  1.7 - 7.7 K/uL   Lymphocytes Relative 34  12 - 46 %   Lymphs Abs 2.9  0.7 - 4.0 K/uL   Monocytes Relative 8  3 - 12 %   Monocytes Absolute 0.7  0.1 - 1.0 K/uL   Eosinophils Relative 0  0 - 5 %   Eosinophils Absolute 0.0  0.0 - 0.7 K/uL   Basophils Relative 0  0 - 1 %   Basophils Absolute 0.0  0.0 - 0.1 K/uL  HIV ANTIBODY (ROUTINE TESTING)   Collection Time    12/26/13  2:45 PM      Result Value Ref Range   HIV NON REACTIVE  NON REACTIVE  URINE RAPID DRUG SCREEN (HOSP PERFORMED)   Collection Time    12/26/13  2:45 PM      Result Value Ref Range   Opiates NONE DETECTED  NONE DETECTED   Cocaine NONE DETECTED  NONE DETECTED   Benzodiazepines NONE DETECTED  NONE DETECTED   Amphetamines NONE DETECTED  NONE DETECTED   Tetrahydrocannabinol POSITIVE (*) NONE DETECTED   Barbiturates NONE DETECTED  NONE DETECTED  URINALYSIS, ROUTINE W REFLEX MICROSCOPIC   Collection Time    12/26/13  2:45 PM      Result Value Ref Range   Color, Urine AMBER (*) YELLOW   APPearance CLEAR  CLEAR  Specific Gravity, Urine >1.030 (*) 1.005 - 1.030   pH 6.0  5.0 - 8.0   Glucose, UA NEGATIVE  NEGATIVE mg/dL   Hgb urine dipstick MODERATE (*) NEGATIVE   Bilirubin Urine NEGATIVE  NEGATIVE   Ketones, ur NEGATIVE  NEGATIVE mg/dL   Protein, ur >161 (*) NEGATIVE mg/dL   Urobilinogen, UA 0.2  0.0 - 1.0 mg/dL   Nitrite NEGATIVE  NEGATIVE   Leukocytes, UA NEGATIVE   NEGATIVE  URINE MICROSCOPIC-ADD ON   Collection Time    12/26/13  2:45 PM      Result Value Ref Range   Squamous Epithelial / LPF MANY (*) RARE   WBC, UA 7-10  <3 WBC/hpf   RBC / HPF 3-6  <3 RBC/hpf   Bacteria, UA MANY (*) RARE   Casts HYALINE CASTS (*) NEGATIVE  ABO/RH   Collection Time    12/26/13  2:45 PM      Result Value Ref Range   ABO/RH(D) B POS    DIC (DISSEMINATED INTRAVASCULAR COAGULATION) PANEL   Collection Time    12/26/13  2:51 PM      Result Value Ref Range   Prothrombin Time 13.4  11.6 - 15.2 seconds   INR 1.04  0.00 - 1.49   aPTT 31  24 - 37 seconds   Fibrinogen 287  204 - 475 mg/dL   D-Dimer, Quant 0.96 (*) 0.00 - 0.48 ug/mL-FEU   Platelets 115 (*) 150 - 400 K/uL   Smear Review NO SCHISTOCYTES SEEN      Imaging Studies: Pt s/p ECHO- results pending Pt s/p LE dopplers- preliminary result shows no evidence of DVT but, note of varicose veins.    Assessment and Plan: Extensive vulvar edema with LE edema and pain in pt with no PNC Admit to Antenatal Betamethasone x 2 doses Foley cath to gravity drainage Reg diet Wound care consult in am to see if they will apple ace or uno boot to LE  sono for anatomy and dating Keep treatment dose Lovenox until final report on LE dopplers  Routine antenatal care  Reakwon Barren L. Harraway-Smith, M.D., [place]n Core

## 2013-12-26 NOTE — Progress Notes (Signed)
*  Preliminary Results* Bilateral lower extremity venous duplex completed. Bilateral lower extremities are negative for deep vein thrombosis. There is no evidence of Baker's cyst bilaterally.  At the level of bilateral external iliac veins there appears to be a multitude of distended varices.  12/26/2013  Gertie FeyMichelle Shikita Vaillancourt, RVT, RDCS, RDMS

## 2013-12-27 ENCOUNTER — Encounter (HOSPITAL_COMMUNITY): Payer: Self-pay | Admitting: *Deleted

## 2013-12-27 ENCOUNTER — Inpatient Hospital Stay (HOSPITAL_COMMUNITY): Payer: Medicaid Other

## 2013-12-27 DIAGNOSIS — E8809 Other disorders of plasma-protein metabolism, not elsewhere classified: Secondary | ICD-10-CM

## 2013-12-27 DIAGNOSIS — E860 Dehydration: Secondary | ICD-10-CM

## 2013-12-27 DIAGNOSIS — I890 Lymphedema, not elsewhere classified: Secondary | ICD-10-CM | POA: Diagnosis present

## 2013-12-27 DIAGNOSIS — Z862 Personal history of diseases of the blood and blood-forming organs and certain disorders involving the immune mechanism: Secondary | ICD-10-CM | POA: Diagnosis present

## 2013-12-27 DIAGNOSIS — IMO0002 Reserved for concepts with insufficient information to code with codable children: Secondary | ICD-10-CM

## 2013-12-27 DIAGNOSIS — O99119 Other diseases of the blood and blood-forming organs and certain disorders involving the immune mechanism complicating pregnancy, unspecified trimester: Secondary | ICD-10-CM | POA: Diagnosis present

## 2013-12-27 DIAGNOSIS — O99891 Other specified diseases and conditions complicating pregnancy: Secondary | ICD-10-CM

## 2013-12-27 DIAGNOSIS — O9989 Other specified diseases and conditions complicating pregnancy, childbirth and the puerperium: Secondary | ICD-10-CM

## 2013-12-27 DIAGNOSIS — N739 Female pelvic inflammatory disease, unspecified: Secondary | ICD-10-CM

## 2013-12-27 DIAGNOSIS — D696 Thrombocytopenia, unspecified: Secondary | ICD-10-CM | POA: Diagnosis present

## 2013-12-27 LAB — BASIC METABOLIC PANEL
BUN: 12 mg/dL (ref 6–23)
CALCIUM: 7.5 mg/dL — AB (ref 8.4–10.5)
CO2: 21 mEq/L (ref 19–32)
Chloride: 102 mEq/L (ref 96–112)
Creatinine, Ser: 0.83 mg/dL (ref 0.50–1.10)
GFR calc non Af Amer: >90 mL/min (ref >90)
GLUCOSE: 102 mg/dL — AB (ref 70–99)
POTASSIUM: 4.2 meq/L (ref 3.7–5.3)
Sodium: 135 mEq/L — ABNORMAL LOW (ref 137–147)

## 2013-12-27 LAB — HEPATIC FUNCTION PANEL
ALK PHOS: 151 U/L — AB (ref 39–117)
ALT: 5 U/L (ref 0–35)
AST: 15 U/L (ref 0–37)
Albumin: 1.6 g/dL — ABNORMAL LOW (ref 3.5–5.2)
Bilirubin, Direct: <0.2 mg/dL (ref 0.0–0.3)
TOTAL PROTEIN: 4.8 g/dL — AB (ref 6.0–8.3)
Total Bilirubin: 0.2 mg/dL — ABNORMAL LOW (ref 0.3–1.2)

## 2013-12-27 LAB — AMYLASE: Amylase: 54 U/L (ref 0–105)

## 2013-12-27 LAB — RUBELLA SCREEN: RUBELLA: 1.58 {index} — AB (ref <0.90)

## 2013-12-27 LAB — PROTEIN / CREATININE RATIO, URINE
Creatinine, Urine: 256.71 mg/dL
Protein Creatinine Ratio: 6.32 — ABNORMAL HIGH (ref 0.00–0.15)
Total Protein, Urine: 1622.1 mg/dL

## 2013-12-27 LAB — LIPASE, BLOOD: Lipase: 14 U/L (ref 11–59)

## 2013-12-27 MED ORDER — SODIUM CHLORIDE 0.9 % IJ SOLN: 3.0000 mL | INTRAMUSCULAR | Status: DC | PRN

## 2013-12-27 MED ORDER — ENOXAPARIN SODIUM 40 MG/0.4ML ~~LOC~~ SOLN: 40.0000 mg | SUBCUTANEOUS | Status: DC

## 2013-12-27 MED ORDER — HYDROMORPHONE HCL 2 MG PO TABS
2.0000 mg | ORAL_TABLET | ORAL | Status: DC | PRN
  Administered 2013-12-27: 2 mg via ORAL
  Administered 2013-12-27: 4 mg via ORAL
  Administered 2013-12-27: 2 mg via ORAL
  Administered 2013-12-27 – 2013-12-28 (×2): 4 mg via ORAL
  Administered 2013-12-28 – 2013-12-29 (×4): 2 mg via ORAL
  Filled 2013-12-27 (×2): qty 2
  Filled 2013-12-27 (×6): qty 1
  Filled 2013-12-27: qty 2
  Filled 2013-12-27: qty 1

## 2013-12-27 MED ORDER — SODIUM CHLORIDE 0.9 % IJ SOLN
3.0000 mL | Freq: Two times a day (BID) | INTRAMUSCULAR | Status: DC
  Administered 2013-12-27 – 2013-12-28 (×2): 3 mL via INTRAVENOUS

## 2013-12-27 MED ORDER — MORPHINE SULFATE 4 MG/ML IJ SOLN
4.0000 mg | INTRAMUSCULAR | Status: DC
  Administered 2013-12-27 – 2013-12-29 (×6): 4 mg via INTRAVENOUS
  Filled 2013-12-27 (×8): qty 1

## 2013-12-27 MED ORDER — SODIUM CHLORIDE 0.9 % IV SOLN: 250.0000 mL | INTRAVENOUS | Status: DC | PRN

## 2013-12-27 NOTE — Progress Notes (Signed)
UR completed 

## 2013-12-27 NOTE — Progress Notes (Signed)
Physical Therapy Note Spoke with lymphedema certified therapist and no known contraindication for manual lymph drainage in pregnancy.  Would like to wait for renal work-up before initiating.  Will coordinate with Lymphedema therapist to see patient Friday afternoon, as appropriate.  In meantime, will ask nursing to encourage patient spend some time in sidelying to faciliate lymph drainage and consider mild pressure to labium via pillow. Thank you, 12/27/2013 Corlis HoveMargie Ranell Finelli, PT [phone]

## 2013-12-27 NOTE — Consult Note (Signed)
WOC wound consult note Reason for Consult: Edema from feet, extending up to vulva.  Wound type: Lymphedema Measurement: Generalized from legs, extending up to perineum.  Impacting mobility and comfort.  Wound bed: None.  Skin is intact at this time.  Dressing procedure/placement/frequency: PT has consulted on wound and agreed that wrapping lower extremities will mobilize excess fluid up into vulva and perineum.  PT is consulting with Lymphedema therapists for manual lymph drainage and WOC agrees that is the best treatment option at this time.  Will not follow at this time.  Please re-consult if needed.  Maple HudsonKaren Debera Sterba RN BSN CWON Pager [phone]

## 2013-12-27 NOTE — Progress Notes (Signed)
Physical Therapy Note Order received, chart reviewed and spoke with patient.  Assessed LE and vulvular edema.  Edema is severe and impacting patient's comfort and mobility.  Will do some research if lymphedema management, in particular, manual lymph drainage, would be appropriate for patient.  I certainly would not wrap LE at this time as that is only going to push fluid to perineum, which is already swollen.  If we can perform manual lymph drainage on pregnant woman, feel that will be our best option.  Will consult our certified lymphedema therapists. Thank you, 12/27/2013 Michele Maldonado, PT [phone]

## 2013-12-27 NOTE — Progress Notes (Deleted)
No interpreter available through the language line or current case management language services Case management and social work notified that we need an interpreter for the patient by Marrion CoyLisa Brewer RN.

## 2013-12-27 NOTE — Progress Notes (Signed)
Patient ID: Michele Maldonado, female   DOB: 1986/01/17, 28 y.o.   MRN: [id number] ACULTY PRACTICE ANTEPARTUM COMPREHENSIVE PROGRESS NOTE  Michele Maldonado is a 28 y.o. G4P3003 at [redacted]w[redacted]d  who is admitted for extensive vulvar edema.   Fetal presentation is unsure. Length of Stay:  1  Days  Subjective: Pt reports that she was comfortable overnight but awakened to intense pain.  Feels like her vulva is getting larger.  Denies SOB.    Patient reports good fetal movement.  She reports no uterine contractions, no bleeding and no loss of fluid per vagina.  Vitals:  Blood pressure 147/84, pulse 99, temperature 98.4 F (36.9 C), temperature source Oral, resp. rate 18, height 5\' 7"  (1.702 m), weight 202 lb 8 oz (91.853 kg), last menstrual period 04/18/2013, SpO2 88.00%. Physical Examination: General appearance - alert, well appearing, and in no distress Chest - clear to auscultation, no wheezes, rales or rhonchi, symmetric air entry Heart - normal rate, regular rhythm, normal S1, S2, no murmurs, rubs, clicks or gallops Abdomen - soft, nontender, nondistended, no masses or organomegaly- GRAVID  Pelvic - normal external genitalia, vulva, vagina, cervix, uterus and adnexa, VULVA: normal appearing vulva with no masses, POSITIVE tenderness; no lesions, vulvar edema- right labia is increased in size  Extremities - peripheral pulses normal, no pedal edema, no clubbing or cyanosis, pedal edema 3 +- symmetric in appearance Cervical Exam: Not evaluated.   Fetal Monitoring:  Baseline: 120's- 130's bpm, Variability: Good {> 6 bpm) and Accelerations: Reactive  Labs:  Results for orders placed during the hospital encounter of 12/26/13 (from the past 24 hour(s))  COMPREHENSIVE METABOLIC PANEL   Collection Time    12/26/13  2:45 PM      Result Value Ref Range   Sodium 138  137 - 147 mEq/L   Potassium 4.0  3.7 - 5.3 mEq/L   Chloride 104  96 - 112 mEq/L   CO2 23  19 - 32 mEq/L   Glucose, Bld 64 (*) 70 - 99 mg/dL   BUN 10  6 - 23 mg/dL   Creatinine, Ser 4.09  0.50 - 1.10 mg/dL   Calcium 7.8 (*) 8.4 - 10.5 mg/dL   Total Protein 5.4 (*) 6.0 - 8.3 g/dL   Albumin 1.6 (*) 3.5 - 5.2 g/dL   AST 16  0 - 37 U/L   ALT 6  0 - 35 U/L   Alkaline Phosphatase 164 (*) 39 - 117 U/L   Total Bilirubin 0.3  0.3 - 1.2 mg/dL   GFR calc non Af Amer >90  >90 mL/min   GFR calc Af Amer >90  >90 mL/min  HEPATITIS B SURFACE ANTIGEN   Collection Time    12/26/13  2:45 PM      Result Value Ref Range   Hepatitis B Surface Ag NEGATIVE  NEGATIVE  RUBELLA SCREEN   Collection Time    12/26/13  2:45 PM      Result Value Ref Range   Rubella 1.58 (*) <0.90 Index  RPR   Collection Time    12/26/13  2:45 PM      Result Value Ref Range   RPR NON REACTIVE  NON REACTIVE  CBC   Collection Time    12/26/13  2:45 PM      Result Value Ref Range   WBC 8.5  4.0 - 10.5 K/uL   RBC 3.21 (*) 3.87 - 5.11 MIL/uL   Hemoglobin 9.0 (*) 12.0 - 15.0 g/dL   HCT 81.1 (*)  36.0 - 46.0 %   MCV 84.4  78.0 - 100.0 fL   MCH 28.0  26.0 - 34.0 pg   MCHC 33.2  30.0 - 36.0 g/dL   RDW 69.614.6  29.511.5 - 28.415.5 %   Platelets 115 (*) 150 - 400 K/uL  DIFFERENTIAL   Collection Time    12/26/13  2:45 PM      Result Value Ref Range   Neutrophils Relative % 58  43 - 77 %   Neutro Abs 4.9  1.7 - 7.7 K/uL   Lymphocytes Relative 34  12 - 46 %   Lymphs Abs 2.9  0.7 - 4.0 K/uL   Monocytes Relative 8  3 - 12 %   Monocytes Absolute 0.7  0.1 - 1.0 K/uL   Eosinophils Relative 0  0 - 5 %   Eosinophils Absolute 0.0  0.0 - 0.7 K/uL   Basophils Relative 0  0 - 1 %   Basophils Absolute 0.0  0.0 - 0.1 K/uL  HIV ANTIBODY (ROUTINE TESTING)   Collection Time    12/26/13  2:45 PM      Result Value Ref Range   HIV NON REACTIVE  NON REACTIVE  URINE RAPID DRUG SCREEN (HOSP PERFORMED)   Collection Time    12/26/13  2:45 PM      Result Value Ref Range   Opiates NONE DETECTED  NONE DETECTED   Cocaine NONE DETECTED  NONE DETECTED   Benzodiazepines NONE DETECTED  NONE DETECTED    Amphetamines NONE DETECTED  NONE DETECTED   Tetrahydrocannabinol POSITIVE (*) NONE DETECTED   Barbiturates NONE DETECTED  NONE DETECTED  URINALYSIS, ROUTINE W REFLEX MICROSCOPIC   Collection Time    12/26/13  2:45 PM      Result Value Ref Range   Color, Urine AMBER (*) YELLOW   APPearance CLEAR  CLEAR   Specific Gravity, Urine >1.030 (*) 1.005 - 1.030   pH 6.0  5.0 - 8.0   Glucose, UA NEGATIVE  NEGATIVE mg/dL   Hgb urine dipstick MODERATE (*) NEGATIVE   Bilirubin Urine NEGATIVE  NEGATIVE   Ketones, ur NEGATIVE  NEGATIVE mg/dL   Protein, ur >132>300 (*) NEGATIVE mg/dL   Urobilinogen, UA 0.2  0.0 - 1.0 mg/dL   Nitrite NEGATIVE  NEGATIVE   Leukocytes, UA NEGATIVE  NEGATIVE  URINE MICROSCOPIC-ADD ON   Collection Time    12/26/13  2:45 PM      Result Value Ref Range   Squamous Epithelial / LPF MANY (*) RARE   WBC, UA 7-10  <3 WBC/hpf   RBC / HPF 3-6  <3 RBC/hpf   Bacteria, UA MANY (*) RARE   Casts HYALINE CASTS (*) NEGATIVE  ABO/RH   Collection Time    12/26/13  2:45 PM      Result Value Ref Range   ABO/RH(D) B POS    DIC (DISSEMINATED INTRAVASCULAR COAGULATION) PANEL   Collection Time    12/26/13  2:51 PM      Result Value Ref Range   Prothrombin Time 13.4  11.6 - 15.2 seconds   INR 1.04  0.00 - 1.49   aPTT 31  24 - 37 seconds   Fibrinogen 287  204 - 475 mg/dL   D-Dimer, Quant 4.406.53 (*) 0.00 - 0.48 ug/mL-FEU   Platelets 115 (*) 150 - 400 K/uL   Smear Review NO SCHISTOCYTES SEEN    BASIC METABOLIC PANEL   Collection Time    12/27/13  5:10 AM  Result Value Ref Range   Sodium 135 (*) 137 - 147 mEq/L   Potassium 4.2  3.7 - 5.3 mEq/L   Chloride 102  96 - 112 mEq/L   CO2 21  19 - 32 mEq/L   Glucose, Bld 102 (*) 70 - 99 mg/dL   BUN 12  6 - 23 mg/dL   Creatinine, Ser 4.09  0.50 - 1.10 mg/dL   Calcium 7.5 (*) 8.4 - 10.5 mg/dL   GFR calc non Af Amer >90  >90 mL/min   GFR calc Af Amer >90  >90 mL/min    Imaging Studies:    EGA 33 3/7 on 12/26/2013   Medications:   Scheduled . betamethasone acetate-betamethasone sodium phosphate  12 mg Intramuscular Q24H  . docusate sodium  100 mg Oral Daily  . enoxaparin (LOVENOX) injection  70 mg Subcutaneous Q12H  .  morphine injection  4 mg Intravenous Q4H  . prenatal multivitamin  1 tablet Oral Q1200   I have reviewed the patient's current medications.  ASSESSMENT: Patient Active Problem List   Diagnosis Date Noted  . Thrombocytopenia, unspecified 12/27/2013  . Vulvar edema 12/26/2013  Dehydration Pregnancy at 33 4/7 weeks- no PNC  hypoalbunemia- ?etiology- possibly pregnancy related  PLAN: Keep therapeutic Lovenox until final report on dopplers Wound care consult to wrap LE Continue routine antenatal care. Increase IVF if ECHO normal  Advance diet  HARRAWAY-SMITH, Dal Blew 12/27/2013,7:38 AM

## 2013-12-27 NOTE — Progress Notes (Signed)
Pt states leaking fluid last pm unsure of location no leaking this am

## 2013-12-27 NOTE — Progress Notes (Signed)
12/27/13 1300  Clinical Encounter Type  Visited With Patient  Visit Type Initial;Spiritual support;Social support  Referral From Nurse  Spiritual Encounters  Spiritual Needs Emotional  Stress Factors  Patient Stress Factors Major life changes;Lack of caregivers   Made lengthy initial visit with Michele Maldonado, who was very appreciative of pastoral support.  Provided intro to Spiritual Care and chaplain availability, reflective listening, encouragement, and affirmation.  Michele Maldonado shared about her process of coping with denial and pregnancy.  In addition to addressing her denial directly, Michele Maldonado also named that she is concerned about postpartum depression because of all of the stressors she is juggling; I encouraged her to speak directly with her MD about this now, proactively, as she prepares for next steps in her care.  She is hopeful that her mom and MGM will come visit from [place] to support her and to help with her three children (ages 693,4,7) at home.  Michele Maldonado is aware of ongoing chaplain availability, but please also page as needs arise or circumstances change significantly:  [phone].  Thank you!  [address]Chaplain Michele Grange Camdenton[place]unde[place], [place] DakotaMDiv [phone]

## 2013-12-27 NOTE — Consult Note (Signed)
Renal Service Consult Note [facility]  Michele Maldonado 12/27/2013 Michele Maldonado D Requesting Physician:  Dr Macon Large  Reason for Consult:  Pregnant female with severe LE and labial edema with proteinuria HPI: The patient is a 28 y.o. year-old with 33wk IUP presented 2d ago with marked LE and labial edema for evaulation.  She had no prenatal care as she was not expecting to complete the pregnancy.  Evaluation has shown BP's 135-150/79-92, borderline low platelets at 115K.  Smear neg for shistocytes, LFT's are normal.  Albumin is low at 1.6, UA > 300 on dipstick.  No SOB or other symptoms.  She has noted the swelling in her hands and feet for "several weeks" but first sought out medical attention 2d ago because of new development of labial swelling.    Patient has delivered 3 prior times, all in [place].  During all three she had "low platelets".  She does not remember having BP problems with any of the 3 pregnancies.  The first two she was induced because of late-term.  The third pregnancy she was hospitalized and induced early at 34-35 weeks for problems with low platelets, according to the patient, this was in 2011.  She has no FHx of renal disease or HTN.  M/F are alive and well.  She has no chronic medical condition, takes no medication , denies use of NSAID"S.      ROS  no abd pain  no dysuria or hematuria  no voiding problems  no sob  no cp  no fevers  no rash, hair loss or oral ulcers  no jt pains  Past Medical History  Past Medical History  Diagnosis Date  . MVP (mitral valve prolapse)    Past Surgical History History reviewed. No pertinent past surgical history. Family History History reviewed. No pertinent family history. Social History  reports that she has never smoked. She does not have any smokeless tobacco history on file. She reports that she does not drink alcohol or use illicit drugs. Allergies No Known Allergies Home medications Prior to Admission  medications   Not on File   Liver Function Tests  Recent Labs Lab 12/26/13 1445 12/27/13 0510  AST 16 15  ALT 6 5  ALKPHOS 164* 151*  BILITOT 0.3 0.2*  PROT 5.4* 4.8*  ALBUMIN 1.6* 1.6*    Recent Labs Lab 12/27/13 0510  LIPASE 14  AMYLASE 54   CBC  Recent Labs Lab 12/26/13 1445 12/26/13 1451  WBC 8.5  --   NEUTROABS 4.9  --   HGB 9.0*  --   HCT 27.1*  --   MCV 84.4  --   PLT 115* 115*   Basic Metabolic Panel  Recent Labs Lab 12/26/13 1445 12/27/13 0510  NA 138 135*  K 4.0 4.2  CL 104 102  CO2 23 21  GLUCOSE 64* 102*  BUN 10 12  CREATININE 0.86 0.83  CALCIUM 7.8* 7.5*    Exam  Blood pressure 130/92, pulse 80, temperature 98.2 F (36.8 C), temperature source Oral, resp. rate 20, height 5\' 7"  (1.702 m), weight 95.482 kg (210 lb 8 oz), last menstrual period 04/18/2013, SpO2 88.00%. Alert, no distress No rash, cyanosis or gangrene Sclera anicteric, throat clear +JVD Chest clear bilat RRR no MRG Abd gravid, nontender, no HSM Labia markedly edematous Bilat symmetric LE edema 3+ mostly below the knees and including the feet Neuro is nf, ox3  UA- >300 prot, 7-10wbc, 3-6rbc ECHO - normal Creat 0.86 (was 0.79 in July  2014) Platelets 115k (was 135k in July 2014) Hb 9.0  Assessment: 1 Anasarca / hypoalbuminemia / 33wk IUP-  suspect nephrotic syndrome is causing her edema.  Given her hx of low platelets with prior pregancies, current slightly elevated BP and slightly elevated creat (both should decrease during pregnancy), preeclampsia should be considered.  There are no systemic signs or history in this patient to suggest a secondary renal disease (SLE, DM, etc).  Primary glomerulopathy is a possibility but less likely.  Will get urine prot-creat ratio, LDH, haptoglobin > will discuss with colleagues and attending. Thanks for the referral, will follow up tomorrow.    Vinson Moselleob Lamont Glasscock MD (pgr) [phone]    [phone] 12/27/2013, 4:40 PM

## 2013-12-27 NOTE — Progress Notes (Addendum)
[facility]N Attending Note  28 y.o. [phone] at 35w4dadmitted for extensive vulvar edema and lower extremity edema of unknown etiology.  Report obtained from Dr. HIhor Dow  I met patient this morning, and evaluated the impressive vulvar edema; edema of left labia >> right labia and foley catheter in place; no vulvar lesions concerning for infection; 3+ symmetric BLE.  Patient still reports significant pain secondary to the edema; still receiving analgesia as needed.  She had negative BLE dopplers, but the scan showed distended varices at the level of external iliac vessels.  OB ultrasound showed viable IUP, normal anatomy, normal EFW of  57%, normal AFI at 116.01cm, cephalic, reactive NST.  Normal HR,  SBP in the 140s but DBP in 60-80s; likely secondary to pain but cannot rule out hypertensive disorder.  Labs showed albumin of 1.6, normal LFTs, BUN 10, Cr 0.86; CBC showed Hgb 9.0, platelet count of 115K (patient reports having thrombocytopenia in previous pregnancies).  UA showed moderate Hgb, >300, hyaline casts. Protein  ECHO was done yesterday, report pending (preliminary report was negative).  Abdominal ultrasound ordered for further evaluation of abdominal/pelvic organs; concerned about mass effect of the gravid uterus on pelvic vessels, ureters, kidneys or other possible abnormal masses.  Will also consult the Hospitalist service for further help in evaluating source of this extensive edema especially in the setting of hypoalbuminemia and abnormal UA; determine need for possible albumin administration or administration of another agent that may help improve oncotic pressure/decrease third-spacing.   Will continue close observation and antenatal care.  Will follow up all studies and recommendations.   UVerita Schneiders MD, FGalesburgAttending OSt. Joseph WCidra

## 2013-12-27 NOTE — Progress Notes (Signed)
Faculty Practice OB/GYN Attending Note  Talked to Dr. Lucretia RoersWood ([facility]) about patient, he felt a Renal consult may be more helpful with this situation.  I then talked to Dr. Darrick Pennaeterding [facility]) and informed him about the patient's situation, he said his colleague, Dr. Arlean HoppingSchertz, will consult with the patient later today and leave recommendations.    ECHO study conclusions (Transthoracic echocardiography. M-mode, complete 2D): - Left ventricle: The cavity size was normal. Wall thickness was increased in a pattern of mild LVH. The estimated ejection fraction was 65%. Wall motion was normal; there were no regional wall motion abnormalities. - Aortic valve: Trivial regurgitation. - Left atrium: The atrium was mildly dilated. - Right ventricle: The cavity size was normal. Systolic function was normal. - Pericardium, extracardiac: Very small posterior pericardial effusion.  Will follow up recommendations, continue close observation and follow up on pending labs/studies.    Jaynie CollinsUGONNA  Darrin Apodaca, MD, FACOG Attending Obstetrician & Gynecologist Faculty Practice, [facility]

## 2013-12-28 ENCOUNTER — Encounter (HOSPITAL_COMMUNITY)
Admit: 2013-12-28 | Discharge: 2013-12-28 | Disposition: A | Discharge status: Home / Self Care | Payer: Medicaid Other | Attending: Obstetrics & Gynecology | Admitting: Obstetrics & Gynecology

## 2013-12-28 LAB — HEPATITIS PANEL, ACUTE
HCV AB: NEGATIVE
HEP A IGM: NONREACTIVE
Hep B C IgM: NONREACTIVE
Hepatitis B Surface Ag: NEGATIVE

## 2013-12-28 LAB — COMPREHENSIVE METABOLIC PANEL
ALT: 6 U/L (ref 0–35)
AST: 17 U/L (ref 0–37)
Albumin: 1.5 g/dL — ABNORMAL LOW (ref 3.5–5.2)
Alkaline Phosphatase: 151 U/L — ABNORMAL HIGH (ref 39–117)
BUN: 15 mg/dL (ref 6–23)
CALCIUM: 7.9 mg/dL — AB (ref 8.4–10.5)
CO2: 21 meq/L (ref 19–32)
CREATININE: 0.85 mg/dL (ref 0.50–1.10)
Chloride: 104 mEq/L (ref 96–112)
Glucose, Bld: 118 mg/dL — ABNORMAL HIGH (ref 70–99)
Potassium: 4.4 mEq/L (ref 3.7–5.3)
SODIUM: 136 meq/L — AB (ref 137–147)
Total Bilirubin: <0.2 mg/dL — ABNORMAL LOW (ref 0.3–1.2)
Total Protein: 5.1 g/dL — ABNORMAL LOW (ref 6.0–8.3)

## 2013-12-28 LAB — CBC
HCT: 27.3 % — ABNORMAL LOW (ref 36.0–46.0)
Hemoglobin: 8.8 g/dL — ABNORMAL LOW (ref 12.0–15.0)
MCH: 27.4 pg (ref 26.0–34.0)
MCHC: 32.2 g/dL (ref 30.0–36.0)
MCV: 85 fL (ref 78.0–100.0)
PLATELETS: 132 10*3/uL — AB (ref 150–400)
RBC: 3.21 MIL/uL — AB (ref 3.87–5.11)
RDW: 14.8 % (ref 11.5–15.5)
WBC: 7.3 10*3/uL (ref 4.0–10.5)

## 2013-12-28 LAB — PROTIME-INR
INR: 1.02 (ref 0.00–1.49)
PROTHROMBIN TIME: 13.2 s (ref 11.6–15.2)

## 2013-12-28 LAB — APTT: aPTT: 31 seconds (ref 24–37)

## 2013-12-28 LAB — LACTATE DEHYDROGENASE: LDH: 329 U/L — ABNORMAL HIGH (ref 94–250)

## 2013-12-28 LAB — HAPTOGLOBIN: Haptoglobin: <25 mg/dL — ABNORMAL LOW (ref 45–215)

## 2013-12-28 LAB — URIC ACID: Uric Acid, Serum: 6.6 mg/dL (ref 2.4–7.0)

## 2013-12-28 LAB — HEPARIN LEVEL (UNFRACTIONATED): Heparin Unfractionated: 0.52 IU/mL (ref 0.30–0.70)

## 2013-12-28 MED ORDER — DEXTROSE-NACL 5-0.45 % IV SOLN
INTRAVENOUS | Status: DC
  Administered 2013-12-28 – 2013-12-29 (×2): via INTRAVENOUS
  Filled 2013-12-28: qty 1000

## 2013-12-28 MED ORDER — PREDNISONE 50 MG PO TABS
60.0000 mg | ORAL_TABLET | Freq: Every day | ORAL | Status: DC
  Administered 2013-12-28: 60 mg via ORAL
  Filled 2013-12-28 (×3): qty 1

## 2013-12-28 MED ORDER — FUROSEMIDE 10 MG/ML IJ SOLN
20.0000 mg | Freq: Two times a day (BID) | INTRAMUSCULAR | Status: DC
  Administered 2013-12-28 – 2013-12-29 (×2): 20 mg via INTRAVENOUS
  Filled 2013-12-28 (×5): qty 2

## 2013-12-28 MED ORDER — HEPARIN BOLUS VIA INFUSION
4000.0000 [IU] | Freq: Once | INTRAVENOUS | Status: AC
  Administered 2013-12-28: 4000 [IU] via INTRAVENOUS
  Filled 2013-12-28: qty 4000

## 2013-12-28 MED ORDER — ONDANSETRON HCL 4 MG/2ML IJ SOLN
4.0000 mg | Freq: Four times a day (QID) | INTRAMUSCULAR | Status: DC | PRN
  Administered 2013-12-28: 4 mg via INTRAVENOUS
  Filled 2013-12-28: qty 2

## 2013-12-28 MED ORDER — BACITRACIN-NEOMYCIN-POLYMYXIN OINTMENT TUBE
TOPICAL_OINTMENT | Freq: Two times a day (BID) | CUTANEOUS | Status: DC
  Administered 2013-12-28: 23:00:00 via TOPICAL
  Filled 2013-12-28: qty 15

## 2013-12-28 MED ORDER — FUROSEMIDE 20 MG PO TABS
20.0000 mg | ORAL_TABLET | Freq: Two times a day (BID) | ORAL | Status: DC
Start: 2013-12-28 — End: 2013-12-28
  Administered 2013-12-28: 20 mg via ORAL
  Filled 2013-12-28 (×3): qty 1

## 2013-12-28 MED ORDER — HEPARIN (PORCINE) IN NACL 100-0.45 UNIT/ML-% IJ SOLN
18.0000 [IU]/kg/h | INTRAMUSCULAR | Status: DC
  Administered 2013-12-29: 18 [IU]/kg/h via INTRAVENOUS
  Filled 2013-12-28 (×2): qty 250

## 2013-12-28 NOTE — Progress Notes (Signed)
  [facility] Progress Note   Subjective: No complaints  Filed Vitals:   12/27/13 1150 12/27/13 1613 12/27/13 1945 12/27/13 2350  BP: 150/85 130/92 138/79 150/76  Pulse: 83 80 102 106  Temp: 98 F (36.7 C) 98.2 F (36.8 C) 98.3 F (36.8 C) 98.2 F (36.8 C)  TempSrc: Oral Oral Oral Oral  Resp: 24 20 18 18   Height:      Weight:      SpO2:      Alert, no distress  +JVD  Chest clear bilat  RRR no MRG  Abd gravid, nontender, no HSM  Labia markedly edematous  Bilat symmetric LE edema 3+ mostly below the knees and including the feet  Neuro is nf, ox3   UA- >300 prot, 7-10wbc, 3-6rbc  ECHO - normal  Creat 0.86 (was 0.79 in July 2014)  Platelets 115k (was 135k in July 2014)  Hb 9.0   Assessment/Rec:  1 Anasarca / hypoalbuminemia / 33wk IUP- 6gm proteinuria, renal function stable, BP slightly up, platelets borderline low.  Have d/w primary, differential is atypical preeclampsia vs intrinsic renal disease. She has severe neph syndrome either way and the main recommendation is for full anticoagulation (with IV heparin if procedures planned) given the high risk of VTE in nephrotic syndrome which gets worse the lower the serum albumin.  Other recommendations are for cautious diuresis with oral lasix (avoiding hypotension) and oral steroids w prednisone ~ 1mg /kg.  Renal biopsy not recommended due to difficulty and high risk.  I have ordered po lasix and prednisone. Will follow.    Vinson Moselleob Johnathan Heskett MD pager [phone]    cell [phone] 12/28/2013, 8:10 AM   Recent Labs Lab 12/26/13 1445 12/27/13 0510  NA 138 135*  K 4.0 4.2  CL 104 102  CO2 23 21  GLUCOSE 64* 102*  BUN 10 12  CREATININE 0.86 0.83  CALCIUM 7.8* 7.5*    Recent Labs Lab 12/26/13 1445 12/27/13 0510  AST 16 15  ALT 6 5  ALKPHOS 164* 151*  BILITOT 0.3 0.2*  PROT 5.4* 4.8*  ALBUMIN 1.6* 1.6*    Recent Labs Lab 12/26/13 1445 12/26/13 1451  WBC 8.5  --   NEUTROABS 4.9  --   HGB 9.0*  --    HCT 27.1*  --   MCV 84.4  --   PLT 115* 115*   . betamethasone acetate-betamethasone sodium phosphate  12 mg Intramuscular Q24H  . docusate sodium  100 mg Oral Daily  . enoxaparin (LOVENOX) injection  40 mg Subcutaneous Q24H  .  morphine injection  4 mg Intravenous Q4H  . prenatal multivitamin  1 tablet Oral Q1200  . sodium chloride  3 mL Intravenous Q12H     sodium chloride, acetaminophen, calcium carbonate, HYDROmorphone, sodium chloride, zolpidem

## 2013-12-28 NOTE — Progress Notes (Signed)
ANTICOAGULATION CONSULT NOTE - Initial Consult  Pharmacy Consult for heparin Indication:  VTE prophylaxis in nephrotic syndrome   No Known Allergies  Patient Measurements: Height: 5\' 7"  (170.2 cm) Weight: 209 lb 6.4 oz (94.983 kg) IBW/kg (Calculated) : 61.6 Heparin Dosing Weight: 72kg  Vital Signs: Temp: 97.4 F (36.3 C) (02/13 0746) Temp src: Oral (02/13 0746) BP: 147/87 mmHg (02/13 0746) Pulse Rate: 79 (02/13 0955)  Labs:  Recent Labs  12/26/13 1445 12/26/13 1451 12/27/13 0510 12/28/13 0830  HGB 9.0*  --   --  8.8*  HCT 27.1*  --   --  27.3*  PLT 115* 115*  --  132*  APTT  --  31  --   --   LABPROT  --  13.4  --   --   INR  --  1.04  --   --   CREATININE 0.86  --  0.83 0.85   Estimated Creatinine Clearance: 119.8 ml/min (by C-G formula based on Cr of 0.85).  Baseline  Labs: APTT= 31, PT=13.2, INR= 1.02  Medical History: Past Medical History  Diagnosis Date  . MVP (mitral valve prolapse)     Medications:  Lovenox 70 mg every 12 hours (last dose 1503 on 2/12.)  Assessment: G4P3 at 34 5/7 weeks with extensive vulvar edema and LE edema, proteinuria, and borderline low platelets.  Per nephrology she has severe nephrotic syndrome and needs full anticoagulation due to associated VTE risks.   Differential diagnosis is atypical preeclampsia vs intrinsic renal disease.  Faculty practice will have MFM to consult about plan for delivery.  Will start heparin drip for anticoagulation due to quick half life and reversibility if needed.       Goal of Therapy:  Heparin level 0.3-0.7   Plan:  Heparin 4,000 units bolus followed by a maintenance infusion of 1,300 units/hr = 13 ml/hr.  Check heparin level 6 hours after infusion started and adjust accordingly.    Berlin HunMendenhall, Evaluna Utke D 12/28/2013,10:37 AM

## 2013-12-28 NOTE — Progress Notes (Signed)
Patient ID: Michele Maldonado, female   DOB: October 18, 1986, 28 y.o.   MRN: [id number] FACULTY PRACTICE ANTEPARTUM(COMPREHENSIVE) NOTE  Michele Maldonado is a 28 y.o. G4P3003 at 6478w5d by best clinical estimate who is admitted for severe swelling and proteinuria, ? Atypical pre-eclampsia vs. Nephrotic syndrome.   Fetal presentation is cephalic. Length of Stay:  2  Days  Subjective: Denies headache, vision changes, RUQ pain. Patient reports the fetal movement as active. Patient reports uterine contraction  activity as none. Patient reports  vaginal bleeding as none. Patient describes fluid per vagina as None.  Vitals:  Blood pressure 150/76, pulse 106, temperature 98.2 F (36.8 C), temperature source Oral, resp. rate 18, height 5\' 7"  (1.702 m), weight 210 lb 8 oz (95.482 kg), last menstrual period 04/18/2013, SpO2 88.00%. Physical Examination: General appearance: alert, well appearing, and in no distress Abdomen: soft, nontender, nondistended, no masses or organomegaly Fundal Height:  size equals dates Pelvic Exam:  Massive labial edema Extremities: edema 4+  DTR's: 1+ Membranes:intact  Fetal Monitoring:  Baseline: 120 bpm, Variability: Good {> 6 bpm), Accelerations: Reactive and Decelerations: Absent  Labs:  Results for orders placed during the hospital encounter of 12/26/13 (from the past 24 hour(s))  PROTEIN / CREATININE RATIO, URINE   Collection Time    12/27/13  5:55 PM      Result Value Ref Range   Creatinine, Urine 256.71     Total Protein, Urine 1622.1     PROTEIN CREATININE RATIO 6.32 (*) 0.00 - 0.15  URIC ACID   Collection Time    12/28/13  5:30 AM      Result Value Ref Range   Uric Acid, Serum 6.6  2.4 - 7.0 mg/dL  LACTATE DEHYDROGENASE   Collection Time    12/28/13  5:30 AM      Result Value Ref Range   LDH 329 (*) 94 - 250 U/L    [place] Abdomen Complete  12/27/2013   CLINICAL DATA:  Vulvar edema and bilateral lower extremity edema  EXAM: ULTRASOUND ABDOMEN COMPLETE   COMPARISON:  None.  FINDINGS: Gallbladder:  No gallstones or wall thickening visualized. No sonographic Murphy sign noted.  Common bile duct:  Diameter: 2.8 mm  Liver:  No focal lesion identified. Within normal limits in parenchymal echogenicity.  IVC:  No abnormality visualized.  Pancreas:  Visualized portion unremarkable.  Spleen:  Size and appearance within normal limits.  Right Kidney:  Length: 13.1 cm. Normal parenchymal echogenicity. No masses. Mild to moderate hydronephrosis.  Left Kidney:  Length: 12.1 cm. Normal parenchymal echogenicity. Mild to moderate hydronephrosis.  Abdominal aorta:  No aneurysm visualized.  Other findings:  Small pleural effusion.  IMPRESSION: 1. Mild to moderate bilateral hydronephrosis. This is likely due to extrinsic compression of both ureters from the gravid uterus. 2. Small pleural effusion. 3. No other abnormalities.   Electronically Signed   By: Amie Portlandavid  Ormond M.D.   On: 12/27/2013 17:33    Medications:  Scheduled . betamethasone acetate-betamethasone sodium phosphate  12 mg Intramuscular Q24H  . docusate sodium  100 mg Oral Daily  . enoxaparin (LOVENOX) injection  40 mg Subcutaneous Q24H  .  morphine injection  4 mg Intravenous Q4H  . prenatal multivitamin  1 tablet Oral Q1200  . sodium chloride  3 mL Intravenous Q12H   I have reviewed the patient's current medications.  ASSESSMENT: Patient Active Problem List   Diagnosis Date Noted  . Thrombocytopenia, unspecified 12/27/2013  . Hypoalbuminemia 12/27/2013  . Dehydration 12/27/2013  . Lymphedema  of genitalia 12/27/2013  . Vulvar edema 12/26/2013    PLAN: Massive proteinuria-? Atypical pre-eclampsia, certainly not typical.  May need delivery to sort out.  BP's are increasingly up, LDH is up as is uric acid.  Unclear that this would be true for nephrotic syndrome only.  Will discuss as a team and determine plan.  Remain NPO. Repeat CBC and CMP today.  Michele Maldonado S 12/28/2013,7:47 AM

## 2013-12-28 NOTE — Progress Notes (Signed)
ANTICOAGULATION CONSULT NOTE - Follow Up Consult  Pharmacy Consult for Heparin Indication: VTE prophylaxis nephrotic syndrome  No Known Allergies  Patient Measurements: Height: 5\' 7"  (170.2 cm) Weight: 209 lb 6.4 oz (94.983 kg) IBW/kg (Calculated) : 61.6 Heparin Dosing Weight: 72kg  Vital Signs: Temp: 98.2 F (36.8 C) (02/13 1951) Temp src: Axillary (02/13 1951) BP: 142/93 mmHg (02/13 1951) Pulse Rate: 98 (02/13 1951)  Labs:  Recent Labs  12/26/13 1445 12/26/13 1451 12/27/13 0510 12/28/13 0830 12/28/13 1038 12/28/13 1648  HGB 9.0*  --   --  8.8*  --   --   HCT 27.1*  --   --  27.3*  --   --   PLT 115* 115*  --  132*  --   --   APTT  --  31  --   --  31  --   LABPROT  --  13.4  --   --  13.2  --   INR  --  1.04  --   --  1.02  --   HEPARINUNFRC  --   --   --   --   --  0.52  CREATININE 0.86  --  0.83 0.85  --   --     Estimated Creatinine Clearance: 119.8 ml/min (by C-G formula based on Cr of 0.85).   Medications:    Assessment: Heparin drip initiated today for full anticoagulation in pt with nephrotic syndrome. The Heparin level at 1648 (6hr level) was 0.52. Will continue Heparin drip at current rate since level within desired range of 0.3-0.7. Goal of Therapy:  Heparin level 0.3-0.7    Plan:  1.. Continue Heparin at current rate of 1300 units= 713ml/hr. 2. Check daily Heparin levels and CBC and adjust accordingly to maintain within desired level. 3. Will continue to follow and watch for s/s bleeding and plan for delivery. Thanks!  Claybon Jabsngel, Thorn Demas G 12/28/2013,8:17 PM

## 2013-12-28 NOTE — Progress Notes (Signed)
Physical Therapy Note Spoke with RN this morning and reviewed chart.  Patient currently being worked up for atypical preeclampsia vs intrinsic renal disease.  Do not feel PT is indicated at this time for lymphedema management.  Please order if you feel appropriate in future.  Thank you, 12/28/2013 Corlis HoveMargie Mystie Ormand, PT [phone]

## 2013-12-28 NOTE — Consult Note (Signed)
MFM Note  Michele Maldonado is a 28 year old G55P3 AA female at 33+5 weeks who was admitted 2 days ago after presenting to [facility] with the C/O labial swelling. She had had no prenatal care. Patient reports having significant lower extremity edema for 4-5 weeks. Also, there has been mild edema of her hands and face. When her labial began to swell 3 days ago, she sought medical care.  OB history: 3 SVDs at term or near term; wts 7+14, 8+4 and 8+15; her only reported complications were mild thrombocytopenia and anemia  Denies any medical or surgical conditions  Allergies: none  Imaging and tests since admission:  OB US: singleton pregnancy, no structural anomalies identified; normal AFV, anterior placenta; EGA = 33+3 weeks Doppler studies of LEs and abdomen were negative for thrombosis Maternal ECHO: normal Labs: Pro/Cr 6.32; H/H 8.8/27.3; albumin 1.5; LFTs normal; Cr 0.85; LDH 329; plts 132k; haptoglobin <25; UA + hyaline casts; D-dimer 6.53; no schistocytes on peripheral smear; ANA, C3 and C4 pending  Renal consult: Dx - suspect nephrotic syndrome/anasarca/hypoalbuimemia; recs: full anticoagulation; cautious diuresis and prednisone  BPs 140s-150s/70s-80s Fetal tracing: excellent variability with a low-ish baseline   When I met with Michele Maldonado, she was clearly upset and frustrated with her situation. She was miserable due to the labial edema but stated that the pain medicine helps. She wanted to be delivered so that the swelling would improve. I attempted to explain that she clinical picture is not c/w one particular diagnosis and her work-up was ongoing. Her symptoms overlap with diagnoses mainly: under lying renal disease resulting in nephrotic syndrome and atypical preeclampsia. I spoke with two of my colleagues and from the information we have currently, we agreed that there was no indication to deliver this premature fetus at this time.  There are at least 8 reported cases of massive labial edema  in pregnancy. In all cases, the women had low serum albumin and most also had severe preeclampsia or uncontrolled diabetes which required delivery.  Conservative methods were utilized in the majority of cases including topical magnesium sulfate dressings, immersion therapy (water to shoulder level), T-burg positioning and ice packs. Surgery has been performed in a few cases but all were following C/S for preeclampsia with severe features.  Assessment: 1) IUP at approximately 33+ weeks 2) Maternal status: proteinuria thought to be nephrotic syndrome of unknown etiology; mildly elevated blood pressures; severe LE and labial edema; hypoalbuminemia; continued input from nephrologists appreciated 3) Fetal status: S/P BMZ; all testing and monitoring indicates a healthy 33+ week fetus 4) H/O probable pregnancy induced thrombocytopenia 5) H/O chronic anemia (took 3 iron pills/d with other pregnancies)  Recommendations:  1) Goal: to keep patient as comfortable as possible; would ask therapist from lymphedema team to see her 2) Monitor closely for any signs or symptoms of severe features of preeclampsia such as HELLP syndrome, pulmonary edema and CNS abnormalities and for any other underlying disease processes which would explain the proteinuria and hypoalbuminemia with resultant severe lower body edema; also consider other more uncommon and overlapping diagnoses such as PaHUS/TTP/DIC spectrum 3) Could attempt therapies mentioned above 4) Consider nutritional evaluation if not already obtained  5) If a transfer to [facility] is requested, please call MFM on call for [employer]  Please do not hesitate to call with questions or concerns.   (Face-to-face time with  patient and coordination of care: 45 min)

## 2013-12-29 ENCOUNTER — Encounter (HOSPITAL_COMMUNITY): Payer: Self-pay | Admitting: *Deleted

## 2013-12-29 ENCOUNTER — Encounter (HOSPITAL_COMMUNITY): Payer: Medicaid Other | Admitting: Anesthesiology

## 2013-12-29 ENCOUNTER — Encounter (HOSPITAL_COMMUNITY)
Admission: AD | Disposition: A | Discharge status: Home / Self Care | Payer: Self-pay | Source: Ambulatory Visit | Attending: Obstetrics & Gynecology

## 2013-12-29 ENCOUNTER — Inpatient Hospital Stay (HOSPITAL_COMMUNITY): Payer: Medicaid Other | Admitting: Anesthesiology

## 2013-12-29 ENCOUNTER — Ambulatory Visit (HOSPITAL_COMMUNITY)
Admit: 2013-12-29 | Discharge: 2013-12-29 | Disposition: A | Discharge status: Home / Self Care | Payer: Medicaid Other | Attending: Obstetrics and Gynecology | Admitting: Obstetrics and Gynecology

## 2013-12-29 DIAGNOSIS — IMO0002 Reserved for concepts with insufficient information to code with codable children: Secondary | ICD-10-CM

## 2013-12-29 DIAGNOSIS — I319 Disease of pericardium, unspecified: Secondary | ICD-10-CM

## 2013-12-29 DIAGNOSIS — O1414 Severe pre-eclampsia complicating childbirth: Secondary | ICD-10-CM

## 2013-12-29 DIAGNOSIS — D696 Thrombocytopenia, unspecified: Secondary | ICD-10-CM

## 2013-12-29 LAB — CBC
HCT: 26.7 % — ABNORMAL LOW (ref 36.0–46.0)
HEMATOCRIT: 33.3 % — AB (ref 36.0–46.0)
HEMOGLOBIN: 11.1 g/dL — AB (ref 12.0–15.0)
Hemoglobin: 8.6 g/dL — ABNORMAL LOW (ref 12.0–15.0)
MCH: 27.4 pg (ref 26.0–34.0)
MCH: 28.5 pg (ref 26.0–34.0)
MCHC: 32.2 g/dL (ref 30.0–36.0)
MCHC: 33.3 g/dL (ref 30.0–36.0)
MCV: 85 fL (ref 78.0–100.0)
MCV: 85.6 fL (ref 78.0–100.0)
Platelets: 103 10*3/uL — ABNORMAL LOW (ref 150–400)
Platelets: 135 10*3/uL — ABNORMAL LOW (ref 150–400)
RBC: 3.14 MIL/uL — ABNORMAL LOW (ref 3.87–5.11)
RBC: 3.89 MIL/uL (ref 3.87–5.11)
RDW: 14.6 % (ref 11.5–15.5)
RDW: 14.8 % (ref 11.5–15.5)
WBC: 16.3 10*3/uL — ABNORMAL HIGH (ref 4.0–10.5)
WBC: 9.8 10*3/uL (ref 4.0–10.5)

## 2013-12-29 LAB — COMPREHENSIVE METABOLIC PANEL
ALT: 8 U/L (ref 0–35)
AST: 21 U/L (ref 0–37)
Albumin: 1.4 g/dL — ABNORMAL LOW (ref 3.5–5.2)
Alkaline Phosphatase: 145 U/L — ABNORMAL HIGH (ref 39–117)
BUN: 18 mg/dL (ref 6–23)
CALCIUM: 7.7 mg/dL — AB (ref 8.4–10.5)
CHLORIDE: 103 meq/L (ref 96–112)
CO2: 23 meq/L (ref 19–32)
CREATININE: 1 mg/dL (ref 0.50–1.10)
GFR, EST AFRICAN AMERICAN: 90 mL/min — AB (ref >90)
GFR, EST NON AFRICAN AMERICAN: 78 mL/min — AB (ref >90)
GLUCOSE: 110 mg/dL — AB (ref 70–99)
Potassium: 4.3 mEq/L (ref 3.7–5.3)
Sodium: 138 mEq/L (ref 137–147)
Total Protein: 4.6 g/dL — ABNORMAL LOW (ref 6.0–8.3)

## 2013-12-29 LAB — DIC (DISSEMINATED INTRAVASCULAR COAGULATION) PANEL
APTT: 31 s (ref 24–37)
PROTHROMBIN TIME: 13.4 s (ref 11.6–15.2)
SMEAR REVIEW: NONE SEEN

## 2013-12-29 LAB — PROTIME-INR
INR: 0.95 (ref 0.00–1.49)
Prothrombin Time: 12.5 seconds (ref 11.6–15.2)

## 2013-12-29 LAB — DIC (DISSEMINATED INTRAVASCULAR COAGULATION)PANEL
D-Dimer, Quant: 3.96 ug/mL-FEU — ABNORMAL HIGH (ref 0.00–0.48)
Fibrinogen: 230 mg/dL (ref 204–475)
INR: 1.04 (ref 0.00–1.49)
Platelets: 111 10*3/uL — ABNORMAL LOW (ref 150–400)

## 2013-12-29 LAB — HEPARIN LEVEL (UNFRACTIONATED): Heparin Unfractionated: 0.42 IU/mL (ref 0.30–0.70)

## 2013-12-29 LAB — APTT: APTT: 34 s (ref 24–37)

## 2013-12-29 LAB — PREPARE RBC (CROSSMATCH)

## 2013-12-29 LAB — C3 COMPLEMENT: C3 COMPLEMENT: 127 mg/dL (ref 90–180)

## 2013-12-29 LAB — C4 COMPLEMENT: COMPLEMENT C4, BODY FLUID: 15 mg/dL — AB (ref 10–40)

## 2013-12-29 LAB — PLATELET COUNT: Platelets: 116 10*3/uL — ABNORMAL LOW (ref 150–400)

## 2013-12-29 SURGERY — Surgical Case
Anesthesia: Spinal | Site: Abdomen

## 2013-12-29 MED ORDER — FENTANYL CITRATE 0.05 MG/ML IJ SOLN
INTRAMUSCULAR | Status: DC | PRN
  Administered 2013-12-29: 85 ug via INTRAVENOUS

## 2013-12-29 MED ORDER — ACETAMINOPHEN 160 MG/5ML PO SOLN
ORAL | Status: AC
  Filled 2013-12-29: qty 20.3

## 2013-12-29 MED ORDER — ONDANSETRON HCL 4 MG/2ML IJ SOLN
INTRAMUSCULAR | Status: DC | PRN
  Administered 2013-12-29: 4 mg via INTRAVENOUS

## 2013-12-29 MED ORDER — HEPARIN (PORCINE) IN NACL 100-0.45 UNIT/ML-% IJ SOLN: 18.0000 [IU]/kg/h | INTRAMUSCULAR | Status: DC

## 2013-12-29 MED ORDER — ACETAMINOPHEN 160 MG/5ML PO SOLN
ORAL | Status: AC
  Administered 2013-12-29: 975 mg via ORAL
  Filled 2013-12-29: qty 20.3

## 2013-12-29 MED ORDER — MORPHINE SULFATE (PF) 0.5 MG/ML IJ SOLN
INTRAMUSCULAR | Status: DC | PRN
  Administered 2013-12-29: 4.9 mg via INTRAVENOUS

## 2013-12-29 MED ORDER — HEPARIN (PORCINE) IN NACL 100-0.45 UNIT/ML-% IJ SOLN
1300.0000 [IU]/h | INTRAMUSCULAR | Status: DC
  Administered 2013-12-29: 1300 [IU]/h via INTRAVENOUS
  Filled 2013-12-29 (×2): qty 250

## 2013-12-29 MED ORDER — ONDANSETRON HCL 4 MG/2ML IJ SOLN
INTRAMUSCULAR | Status: AC
  Filled 2013-12-29: qty 2

## 2013-12-29 MED ORDER — FENTANYL CITRATE 0.05 MG/ML IJ SOLN
INTRAMUSCULAR | Status: AC
  Filled 2013-12-29: qty 2

## 2013-12-29 MED ORDER — FERUMOXYTOL INJECTION 510 MG/17 ML
510.0000 mg | Freq: Once | INTRAVENOUS | Status: DC
  Filled 2013-12-29: qty 17

## 2013-12-29 MED ORDER — MEPERIDINE HCL 25 MG/ML IJ SOLN: 6.2500 mg | INTRAMUSCULAR | Status: DC | PRN

## 2013-12-29 MED ORDER — BACITRACIN-NEOMYCIN-POLYMYXIN OINTMENT TUBE
TOPICAL_OINTMENT | CUTANEOUS | Status: DC | PRN
  Filled 2013-12-29: qty 15

## 2013-12-29 MED ORDER — FENTANYL CITRATE 0.05 MG/ML IJ SOLN
INTRAMUSCULAR | Status: AC
  Administered 2013-12-29: 50 ug via INTRAVENOUS
  Filled 2013-12-29: qty 2

## 2013-12-29 MED ORDER — BUPIVACAINE HCL (PF) 0.5 % IJ SOLN
INTRAMUSCULAR | Status: AC
  Filled 2013-12-29: qty 30

## 2013-12-29 MED ORDER — SCOPOLAMINE 1 MG/3DAYS TD PT72
1.0000 | MEDICATED_PATCH | Freq: Once | TRANSDERMAL | Status: DC
  Administered 2013-12-29: 1.5 mg via TRANSDERMAL

## 2013-12-29 MED ORDER — MISOPROSTOL 200 MCG PO TABS
600.0000 ug | ORAL_TABLET | Freq: Once | ORAL | Status: AC
  Administered 2013-12-29: 600 ug via RECTAL

## 2013-12-29 MED ORDER — FENTANYL CITRATE 0.05 MG/ML IJ SOLN
INTRAMUSCULAR | Status: DC | PRN
  Administered 2013-12-29: 15 ug via INTRATHECAL

## 2013-12-29 MED ORDER — HYDROMORPHONE HCL PF 1 MG/ML IJ SOLN
INTRAMUSCULAR | Status: AC
  Administered 2013-12-29: 0.25 mg via INTRAVENOUS
  Filled 2013-12-29: qty 1

## 2013-12-29 MED ORDER — HYDROMORPHONE HCL PF 1 MG/ML IJ SOLN
INTRAMUSCULAR | Status: AC
  Filled 2013-12-29: qty 2

## 2013-12-29 MED ORDER — HYDROMORPHONE 0.3 MG/ML IV SOLN
INTRAVENOUS | Status: DC
  Administered 2013-12-29: 0.3 mg via INTRAVENOUS
  Administered 2013-12-30: 1.8 mg via INTRAVENOUS

## 2013-12-29 MED ORDER — PHENYLEPHRINE 8 MG IN D5W 100 ML (0.08MG/ML) PREMIX OPTIME
INJECTION | INTRAVENOUS | Status: DC | PRN
  Administered 2013-12-29: 60 ug/min via INTRAVENOUS

## 2013-12-29 MED ORDER — OXYTOCIN 10 UNIT/ML IJ SOLN
INTRAMUSCULAR | Status: AC
  Filled 2013-12-29: qty 4

## 2013-12-29 MED ORDER — FENTANYL CITRATE 0.05 MG/ML IJ SOLN
25.0000 ug | INTRAMUSCULAR | Status: DC | PRN
  Administered 2013-12-29 (×3): 50 ug via INTRAVENOUS

## 2013-12-29 MED ORDER — ZINC OXIDE 20 % EX OINT
TOPICAL_OINTMENT | Freq: Two times a day (BID) | CUTANEOUS | Status: DC | PRN
  Filled 2013-12-29: qty 114
  Filled 2013-12-29: qty 28.35

## 2013-12-29 MED ORDER — CEFAZOLIN SODIUM-DEXTROSE 2-3 GM-% IV SOLR
INTRAVENOUS | Status: DC | PRN
  Administered 2013-12-29: 2 g via INTRAVENOUS

## 2013-12-29 MED ORDER — PHENYLEPHRINE 8 MG IN D5W 100 ML (0.08MG/ML) PREMIX OPTIME
INJECTION | INTRAVENOUS | Status: AC
  Filled 2013-12-29: qty 100

## 2013-12-29 MED ORDER — HYDROMORPHONE HCL PF 1 MG/ML IJ SOLN
2.0000 mg | Freq: Once | INTRAMUSCULAR | Status: AC
  Administered 2013-12-29: 2 mg via INTRAVENOUS

## 2013-12-29 MED ORDER — HYDROMORPHONE HCL PF 1 MG/ML IJ SOLN
0.2500 mg | INTRAMUSCULAR | Status: DC | PRN
  Administered 2013-12-29 (×5): 0.25 mg via INTRAVENOUS

## 2013-12-29 MED ORDER — MORPHINE SULFATE (PF) 0.5 MG/ML IJ SOLN
INTRAMUSCULAR | Status: DC | PRN
  Administered 2013-12-29: .1 mg via INTRATHECAL

## 2013-12-29 MED ORDER — BUPIVACAINE IN DEXTROSE 0.75-8.25 % IT SOLN
INTRATHECAL | Status: DC | PRN
  Administered 2013-12-29: 1.6 mL via INTRATHECAL

## 2013-12-29 MED ORDER — ACETAMINOPHEN 160 MG/5ML PO SOLN
975.0000 mg | Freq: Four times a day (QID) | ORAL | Status: DC | PRN
  Administered 2013-12-29 – 2013-12-30 (×2): 975 mg via ORAL
  Filled 2013-12-29: qty 40

## 2013-12-29 MED ORDER — MORPHINE SULFATE 0.5 MG/ML IJ SOLN
INTRAMUSCULAR | Status: AC
  Filled 2013-12-29: qty 10

## 2013-12-29 MED ORDER — BUPIVACAINE HCL (PF) 0.5 % IJ SOLN
INTRAMUSCULAR | Status: DC | PRN
  Administered 2013-12-29: 30 mL

## 2013-12-29 MED ORDER — CEFAZOLIN SODIUM-DEXTROSE 2-3 GM-% IV SOLR
INTRAVENOUS | Status: AC
  Filled 2013-12-29: qty 50

## 2013-12-29 MED ORDER — SCOPOLAMINE 1 MG/3DAYS TD PT72
MEDICATED_PATCH | TRANSDERMAL | Status: AC
  Administered 2013-12-29: 1.5 mg via TRANSDERMAL
  Filled 2013-12-29: qty 1

## 2013-12-29 MED ORDER — OXYTOCIN 10 UNIT/ML IJ SOLN
40.0000 [IU] | INTRAMUSCULAR | Status: DC | PRN
  Administered 2013-12-29: 40 [IU] via INTRAVENOUS

## 2013-12-29 MED ORDER — METOCLOPRAMIDE HCL 5 MG/ML IJ SOLN: 10.0000 mg | Freq: Once | INTRAMUSCULAR | Status: AC | PRN

## 2013-12-29 MED ORDER — MISOPROSTOL 200 MCG PO TABS
ORAL_TABLET | ORAL | Status: AC
  Filled 2013-12-29: qty 3

## 2013-12-29 MED ORDER — LORATADINE 10 MG PO TABS
10.0000 mg | ORAL_TABLET | Freq: Every day | ORAL | Status: DC
  Filled 2013-12-29 (×2): qty 1

## 2013-12-29 MED ORDER — HYDROMORPHONE HCL PF 1 MG/ML IJ SOLN
INTRAMUSCULAR | Status: AC
  Filled 2013-12-29: qty 1

## 2013-12-29 MED ORDER — LACTATED RINGERS IV SOLN
INTRAVENOUS | Status: DC | PRN
  Administered 2013-12-29: 18:00:00 via INTRAVENOUS

## 2013-12-29 SURGICAL SUPPLY — 39 items
BARRIER ADHS 3X4 INTERCEED (GAUZE/BANDAGES/DRESSINGS) IMPLANT
CLAMP CORD UMBIL (MISCELLANEOUS) IMPLANT
CLOTH BEACON ORANGE TIMEOUT ST (SAFETY) ×3 IMPLANT
CONTAINER PREFILL 10% NBF 15ML (MISCELLANEOUS) IMPLANT
DRAIN PENROSE 1/4X12 LTX (DRAIN) ×3 IMPLANT
DRAPE LG THREE QUARTER DISP (DRAPES) IMPLANT
DRESSING TELFA 8X3 (GAUZE/BANDAGES/DRESSINGS) ×3 IMPLANT
DRSG OPSITE POSTOP 4X10 (GAUZE/BANDAGES/DRESSINGS) ×3 IMPLANT
DURAPREP 26ML APPLICATOR (WOUND CARE) ×3 IMPLANT
ELECT REM PT RETURN 9FT ADLT (ELECTROSURGICAL) ×3
ELECTRODE REM PT RTRN 9FT ADLT (ELECTROSURGICAL) ×1 IMPLANT
EXTRACTOR VACUUM KIWI (MISCELLANEOUS) IMPLANT
GAUZE SPONGE 4X4 12PLY STRL LF (GAUZE/BANDAGES/DRESSINGS) ×3 IMPLANT
GLOVE BIO SURGEON STRL SZ 6.5 (GLOVE) ×2 IMPLANT
GLOVE BIO SURGEONS STRL SZ 6.5 (GLOVE) ×1
GOWN STRL REUS W/TWL LRG LVL3 (GOWN DISPOSABLE) ×6 IMPLANT
KIT ABG SYR 3ML LUER SLIP (SYRINGE) IMPLANT
NEEDLE HYPO 25X5/8 SAFETYGLIDE (NEEDLE) IMPLANT
NEEDLE SPNL 18GX3.5 QUINCKE PK (NEEDLE) ×3 IMPLANT
NS IRRIG 1000ML POUR BTL (IV SOLUTION) ×3 IMPLANT
PACK C SECTION WH (CUSTOM PROCEDURE TRAY) ×3 IMPLANT
PAD ABD 7.5X8 STRL (GAUZE/BANDAGES/DRESSINGS) ×3 IMPLANT
PAD OB MATERNITY 4.3X12.25 (PERSONAL CARE ITEMS) ×3 IMPLANT
SUT PDS AB 0 CTX 60 (SUTURE) IMPLANT
SUT VIC AB 0 CT1 27 (SUTURE)
SUT VIC AB 0 CT1 27XBRD ANBCTR (SUTURE) IMPLANT
SUT VIC AB 0 CT1 36 (SUTURE) IMPLANT
SUT VIC AB 2-0 CT1 27 (SUTURE) ×2
SUT VIC AB 2-0 CT1 TAPERPNT 27 (SUTURE) ×1 IMPLANT
SUT VIC AB 2-0 CTX 36 (SUTURE) ×6 IMPLANT
SUT VIC AB 3-0 CT1 27 (SUTURE) ×2
SUT VIC AB 3-0 CT1 TAPERPNT 27 (SUTURE) ×1 IMPLANT
SUT VIC AB 3-0 SH 27 (SUTURE)
SUT VIC AB 3-0 SH 27X BRD (SUTURE) IMPLANT
SYR 30ML LL (SYRINGE) ×3 IMPLANT
TAPE HYPAFIX 4 X10 (GAUZE/BANDAGES/DRESSINGS) ×3 IMPLANT
TOWEL OR 17X24 6PK STRL BLUE (TOWEL DISPOSABLE) ×3 IMPLANT
TRAY FOLEY CATH 14FR (SET/KITS/TRAYS/PACK) ×3 IMPLANT
WATER STERILE IRR 1000ML POUR (IV SOLUTION) ×3 IMPLANT

## 2013-12-29 NOTE — Anesthesia Postprocedure Evaluation (Signed)
  Anesthesia Post-op Note  Anesthesia Post Note  Patient: Michele Maldonado  Procedure(s) Performed: Procedure(s) (LRB): CESAREAN SECTION (N/A)  Anesthesia type: Spinal  Patient location: PACU  Post pain: Pain level controlled  Post assessment: Post-op Vital signs reviewed  Last Vitals:  Filed Vitals:   12/29/13 2245  BP: 127/92  Pulse: 75  Temp: 37.2 C  Resp: 25    Post vital signs: Reviewed  Level of consciousness: awake  Complications: No apparent anesthesia complications.  Ongoing vaginal bleeding, likely due to low albumin.  Vulvar swelling improved from pre-op.  DIC panels normal, BP and HR stable, fundus firm, pain managed.  Code Hemorrhage called per protocol.  Pt has received 4 units of blood in PACU.  Will be transferred to ICU for continued monitoring.

## 2013-12-29 NOTE — Progress Notes (Signed)
Patient ID: Michele Maldonado, female   DOB: 10/30/1986, 28 y.o.   MRN: [id number] S. Patient continues to demand delivery of her baby O. VSS. AF FHR category 1 24 hour urine-1622mg  MRI shows compression of the vena cava A/P. Pre eclampsia with severe vulvar/pedal edema- Because of the labial swelling, she will not be able to deliver this child vaginally. After a discussion with Dr. Otho PerlNitsche of MFM, I have offered Michele Maldonado a PLTCS. I have expressly warned her about the risks of prematurity to the fetus, including a NICU admission. She understands and wishes to proceed asap. I will stop her heparin drip and proceed with cesarean section in 4 hours. OR, anesthesia, and NICU notified.

## 2013-12-29 NOTE — Progress Notes (Signed)
Patient arrived from OR and immediatley began passing clots, Dr Marice Potterove and Dr Rodman Pickleassidy called to bedside Orders to give 2 units of blood stat given,  Blood given and patient continued to pass clots, Post Partum Hemmorhage Code called, all staff [facility] by [facility] except for Dr Rodman Pickleassidy, Dr Marice Potterove and [facility], [facility]renda.  DIC panel ordered and 2 additional units of PRBC ordered and given.  Patient has continued to pass clots with and without uterine massage.  Dr Marice Potterove called to [facility] by [facility], Dr Marice Potterove said to please pass on to ICU staff that patient would continue to pass clots and have a saturated dressing until her clotting factors stabilized.   Dressing reinforced by this RN and [facility] Steward DroneBrenda.  Dr Marice Potterove asked that urinary catheter bag be changed to urometer so that urine output could be monitored more closely, [facility] Brenda, performed the switch.  Report given to ICU RN, Enid Derryarole transport done by [facility] and ICU RN.

## 2013-12-29 NOTE — Op Note (Signed)
[name] PROCEDURE DATE: 12/29/2013  PREOPERATIVE DIAGNOSES: Intrauterine pregnancy at  1838w6d weeks gestation; pre-eclampsia, anasarca, fetal size compressing on inferior vena cava noted on MRI.   POSTOPERATIVE DIAGNOSES: The same  PROCEDURE: Primary Low Transverse Cesarean Section  SURGEON:  Dr. Nicholaus BloomMyra Dove  ASSISTANT:  Dr. Rulon AbideKeli Toi Stelly  ANESTHESIOLOGIST: Dr. Dana AllanAmy Cassidy  INDICATIONS: Michele Maldonado is a 28 y.o. [phone] at 4638w6d here for cesarean section secondary to the indications listed under preoperative diagnoses; please see preoperative note for further details.  The risks of cesarean section were discussed with the patient including but were not limited to: bleeding which may require transfusion or reoperation; infection which may require antibiotics; injury to bowel, bladder, ureters or other surrounding organs; injury to the fetus; need for additional procedures including hysterectomy in the event of a life-threatening hemorrhage; placental abnormalities wth subsequent pregnancies, incisional problems, thromboembolic phenomenon and other postoperative/anesthesia complications.   The patient concurred with the proposed plan, giving informed written consent for the procedure.    FINDINGS:  Viable female infant in cephalic presentation.  Apgars 9 and 9.  Clear amniotic fluid.  Intact placenta, three vessel cord.  Diffuse anasarca of all layers. Normal uterus, fallopian tubes and ovaries bilaterally.  ANESTHESIA: Spinal INTRAVENOUS FLUIDS: 400 ml ESTIMATED BLOOD LOSS: 1000 ml URINE OUTPUT:  150 ml SPECIMENS: Placenta sent to pathology COMPLICATIONS: None immediate  PROCEDURE IN DETAIL:  The patient preoperatively received intravenous antibiotics and had sequential compression devices applied to her lower extremities.  She was then taken to the operating room where spinal anesthesia was administered. She was then placed in a dorsal supine position with a leftward tilt, and prepped and  draped in a sterile manner.  A foley catheter was placed into her bladder and attached to constant gravity.  After an adequate timeout was performed, 30 cc of 0.5% marcaine was injected subcutaneously in the area of the incision. A Pfannenstiel skin incision was made with scalpel and carried through to the underlying layer of fascia. Diffuse tissue edema noted throughout with persistent weeping of the subcutaneous tissue.  The fascia was incised in the midline, and this incision was extended bilaterally using the Mayo scissors.  The fascia was noted to be edematous.  Electrocautery was used to perform a modified mallard technique with the medial 20% of the rectus bilaterally incised.  The peritoneum was entered bluntly and extended laterally with electrocautery.   Attention was turned to the lower uterine segment where a low transverse hysterotomy was made with a scalpel and extended bilaterally bluntly.  The infant was successfully delivered, the cord was clamped and cut and the infant was handed over to awaiting neonatology team. Uterine massage was then administered, and the placenta delivered intact with a three-vessel cord. The uterus was exteriorized and then cleared of clot and debris.  The hysterotomy was closed with 2-0 Vicryl in a running locked fashion, and an imbricating layer was also placed with 0 Vicryl. A hematoma was noted to be forming off of the left uterine artery and 2-0 vicryl was used to tamponade the hematoma.  It was monitored and did not continue to increase in size.  The uterus was returned to the abdomen and the pelvis was cleared of all clot and debris. The hematoma was observed again and no evidence of continued expansion noted.  Hemostasis was confirmed on all surfaces.  The fascia was then closed using 0 Vicryl in a running fashion.  The subcutaneous layer was irrigated.  A Penrose drain  was placed given the diffuse amount of anasarca noted throughout all the layers.  The skin was  closed with a 4-0 Vicryl subcuticular stitch. A pressure dressing was applied. The patient tolerated the procedure well. Sponge, lap, instrument and needle counts were correct x 2.  She was taken to the recovery room in stable condition. She will be transfused 2U PRBCs in PACU given starting hgb of 8.8 and blood loss of 1000cc.    Michele Maldonado, Michele Baseman, MD

## 2013-12-29 NOTE — Progress Notes (Signed)
[facility]

## 2013-12-29 NOTE — Progress Notes (Signed)
Physical Therapy Note  Dr. Emelda FearFerguson contacted weekend PT. Discussed various ways to position pt for edema management if pt unable to tolerate trendelenburg. Discussed bilat LE ace wrapping however concerned it would further push the fluid into the vulva. We do not have access to deep water therapy per Dr. Rayna SextonFerguson's request. See previous therapy notes.  Will discuss with Sherald BargeMarjorie Moton, PT on Monday 2/16 regarding edema management and if there is any other options. Please page Sherald BargeMarjorie Moton, PT at [phone].  Lewis ShockAshly Francis Yardley, PT, DPT Pager #: [phone] Office #: [phone]

## 2013-12-29 NOTE — Progress Notes (Signed)
Called Dr. Shawnie PonsPratt to inform her of pt's continued vaginal bleeding (8x8 cm passed) and nearly saturated just-reinforced dressing. Dr. Shawnie PonsPratt says she has Pen drain without collection device so dressing will likely to saturate, and the blood clots will likely continue as the pt has been oozing. Will continue to observe.

## 2013-12-29 NOTE — Anesthesia Procedure Notes (Signed)
Spinal  Patient location during procedure: OR Start time: 12/29/2013 6:17 PM Staffing Performed by: anesthesiologist  Preanesthetic Checklist Completed: patient identified, site marked, surgical consent, pre-op evaluation, timeout performed, IV checked, risks and benefits discussed and monitors and equipment checked Spinal Block Patient position: right lateral decubitus Prep: site prepped and draped and DuraPrep Patient monitoring: heart rate, continuous pulse ox and blood pressure Approach: midline Location: L3-4 Injection technique: single-shot Needle Needle type: Pencan  Needle gauge: 24 G Needle length: 9 cm Assessment Sensory level: T4 Additional Notes Clear free flow CSF on first attempt with pt lying on right side (unable to sit due to vulvar edema).  No paresthesia.  Patient tolerated procedure well with no apparent complications.  Jasmine DecemberA. Cassidy, MD

## 2013-12-29 NOTE — Progress Notes (Signed)
ANTICOAGULATION CONSULT NOTE - Follow Up Consult  Pharmacy Consult for Heparin Indication: VTE prophylaxis in nephrotic syndrome   No Known Allergies  Patient Measurements: Height: 5\' 7"  (170.2 cm) Weight: 212 lb (96.163 kg) IBW/kg (Calculated) : 61.6 Heparin Dosing Weight: 72 kg  Vital Signs: Temp: 97.9 F (36.6 C) (02/14 0507) Temp src: Oral (02/14 0507) BP: 136/92 mmHg (02/14 0605) Pulse Rate: 78 (02/14 0610)  Labs:  Recent Labs  12/26/13 1445 12/26/13 1451 12/27/13 0510 12/28/13 0830 12/28/13 1038 12/28/13 1648 12/29/13 0520  HGB 9.0*  --   --  8.8*  --   --  8.6*  HCT 27.1*  --   --  27.3*  --   --  26.7*  PLT 115* 115*  --  132*  --   --  135*  APTT  --  31  --   --  31  --   --   LABPROT  --  13.4  --   --  13.2  --   --   INR  --  1.04  --   --  1.02  --   --   HEPARINUNFRC  --   --   --   --   --  0.52  --   CREATININE 0.86  --  0.83 0.85  --   --  1.00    Estimated Creatinine Clearance: 102.4 ml/min (by C-G formula based on Cr of 1).   Medications:   Assessment: Heparin infusion was discontinued for approximately 2 hrs this morning pending further assessment and restarted at the previous rate of 13 ml/hr. Heparin level of 0.52 at this rate was within therapeutic goal of 0.3-0.7. Patient is tolerating anticoagulation well.  Goal of Therapy:  Full anticoagulation with therapeutic heparin level 0.3-0.7  Plan:  1. Heparin infusion restarted at 1300 units/hr (13 ml/hr) 2. We will recheck a heparin level 6 hours after infusion is restarted, and adjust if necessary.  3. Daily heparin levels and CBC thereafter. 4. Continue to follow and watch for s/s bleeding and plan for delivery   Michele Maldonado, Lyan Moyano Maldonado 12/29/2013,7:32 AM

## 2013-12-29 NOTE — Transfer of Care (Signed)
Immediate Anesthesia Transfer of Care Note  Patient: Michele Maldonado  Procedure(s) Performed: Procedure(s): CESAREAN SECTION (N/A)  Patient Location: PACU  Anesthesia Type:Spinal  Level of Consciousness: awake  Airway & Oxygen Therapy: Patient Spontanous Breathing  Post-op Assessment: Report given to PACU RN and Post -op Vital signs reviewed and stable  Post vital signs: stable  Complications: No apparent anesthesia complications

## 2013-12-29 NOTE — Progress Notes (Signed)
  [facility] Progress Note   Subjective: No complaints  Filed Vitals:   12/29/13 0850 12/29/13 0851 12/29/13 1235 12/29/13 1250  BP:  150/104 132/89   Pulse: 72 89 92 76  Temp:   98.2 F (36.8 C)   TempSrc:   Oral   Resp:   18   Height:      Weight:      SpO2: 99%   97%  Alert, no distress  +JVD  Chest clear bilat  RRR no MRG  Abd gravid, nontender, no HSM  Labia markedly edematous  Bilat symmetric LE edema 3+ mostly below the knees and including the feet  Neuro is nf, ox3   UA- >300 prot, 7-10wbc, 3-6rbc  ECHO - normal  Creat 0.86 (was 0.79 in July 2014)  Platelets 115k (was 135k in July 2014)  Hb 9.0   Assessment/Rec:  1 Anasarca / neph syndrome / LE and vulvar edema- fetus compressing bilateral iliac vines and patient is going for C-section this evening. Lasix on hold and heparin stopped.  Will follow.    Vinson Moselleob Zyanne Schumm MD pager [phone]    cell [phone] 12/29/2013, 3:18 PM   Recent Labs Lab 12/27/13 0510 12/28/13 0830 12/29/13 0520  NA 135* 136* 138  K 4.2 4.4 4.3  CL 102 104 103  CO2 21 21 23   GLUCOSE 102* 118* 110*  BUN 12 15 18   CREATININE 0.83 0.85 1.00  CALCIUM 7.5* 7.9* 7.7*    Recent Labs Lab 12/27/13 0510 12/28/13 0830 12/29/13 0520  AST 15 17 21   ALT 5 6 8   ALKPHOS 151* 151* 145*  BILITOT 0.2* <0.2* <0.2*  PROT 4.8* 5.1* 4.6*  ALBUMIN 1.6* 1.5* 1.4*    Recent Labs Lab 12/26/13 1445 12/26/13 1451 12/28/13 0830 12/29/13 0520  WBC 8.5  --  7.3 9.8  NEUTROABS 4.9  --   --   --   HGB 9.0*  --  8.8* 8.6*  HCT 27.1*  --  27.3* 26.7*  MCV 84.4  --  85.0 85.0  PLT 115* 115* 132* 135*   . docusate sodium  100 mg Oral Daily  . ferumoxytol  510 mg Intravenous Once  . furosemide  20 mg Intravenous Q12H  . loratadine  10 mg Oral Daily  .  morphine injection  4 mg Intravenous Q4H  . predniSONE  60 mg Oral Q breakfast  . prenatal multivitamin  1 tablet Oral Q1200  . sodium chloride  3 mL Intravenous Q12H   .  dextrose 5 % and 0.45% NaCl 10 mL/hr at 12/29/13 0500  . heparin 1,300 Units/hr (12/29/13 0743)   sodium chloride, acetaminophen, calcium carbonate, HYDROmorphone, neomycin-bacitracin-polymyxin, ondansetron (ZOFRAN) IV, sodium chloride, zinc oxide, zolpidem

## 2013-12-29 NOTE — Anesthesia Preprocedure Evaluation (Addendum)
Anesthesia Evaluation  Patient identified by MRN, date of birth, ID band Patient awake    Reviewed: Allergy & Precautions, H&P , NPO status , Patient's Chart, lab work & pertinent test results, reviewed documented beta blocker date and time   History of Anesthesia Complications Negative for: history of anesthetic complications  Airway Mallampati: III TM Distance: >3 FB Neck ROM: full    Dental  (+) Teeth Intact   Pulmonary neg pulmonary ROS,  breath sounds clear to auscultation        Cardiovascular hypertension (preeclampsia), MVP Rhythm:regular Rate:Normal     Neuro/Psych negative neurological ROS  negative psych ROS   GI/Hepatic negative GI ROS, (+)       marijuana use,   Endo/Other  BMI 32  Renal/GU Severe nephrotic syndrome     Musculoskeletal   Abdominal   Peds  Hematology  (+) Blood dyscrasia (thrombocytopenia, anemia  - hgb 8.6), , Was on lovenox, then heparin gtt until 1:50 pm today.  Will recheck PTT 4 hours after stopping gtt (prior to C/S)  PT/PTT wnl, plt 116 @ 1740 12/29/13    Anesthesia Other Findings NPO since 9 am  Reproductive/Obstetrics (+) Pregnancy (no PNC, believed to be around 35 weeks)                      Anesthesia Physical Anesthesia Plan  ASA: III  Anesthesia Plan: Spinal   Post-op Pain Management:    Induction:   Airway Management Planned:   Additional Equipment:   Intra-op Plan:   Post-operative Plan:   Informed Consent: I have reviewed the patients History and Physical, chart, labs and discussed the procedure including the risks, benefits and alternatives for the proposed anesthesia with the patient or authorized representative who has indicated his/her understanding and acceptance.     Plan Discussed with: Surgeon and CRNA  Anesthesia Plan Comments:         Anesthesia Quick Evaluation

## 2013-12-29 NOTE — Progress Notes (Signed)
Dr. Emelda FearFerguson here to discuss plan of care. Pt voicing that she would like to be delivered due to pain of perineum.

## 2013-12-29 NOTE — Progress Notes (Signed)
Heparin off. Pt to have cesarean section in 4 hours. Risk of cesarean section being reviewed with patient and family per Dr. Reola CalkinsBeck. Patient and family voice an understanding of possible risk and are in acceptance.

## 2013-12-30 ENCOUNTER — Encounter (HOSPITAL_COMMUNITY): Payer: Self-pay | Admitting: *Deleted

## 2013-12-30 ENCOUNTER — Encounter (HOSPITAL_COMMUNITY)
Admission: AD | Disposition: A | Discharge status: Home / Self Care | Payer: Self-pay | Source: Ambulatory Visit | Attending: Obstetrics & Gynecology

## 2013-12-30 ENCOUNTER — Inpatient Hospital Stay (HOSPITAL_COMMUNITY): Payer: Medicaid Other | Admitting: Anesthesiology

## 2013-12-30 ENCOUNTER — Encounter (HOSPITAL_COMMUNITY): Payer: Medicaid Other | Admitting: Anesthesiology

## 2013-12-30 DIAGNOSIS — Z98891 History of uterine scar from previous surgery: Secondary | ICD-10-CM

## 2013-12-30 DIAGNOSIS — O34219 Maternal care for unspecified type scar from previous cesarean delivery: Secondary | ICD-10-CM

## 2013-12-30 HISTORY — PX: REPAIR VAGINAL CUFF: SHX6067

## 2013-12-30 LAB — COMPREHENSIVE METABOLIC PANEL
ALBUMIN: 1.2 g/dL — AB (ref 3.5–5.2)
ALT: 12 U/L (ref 0–35)
AST: 28 U/L (ref 0–37)
Alkaline Phosphatase: 103 U/L (ref 39–117)
BILIRUBIN TOTAL: 0.4 mg/dL (ref 0.3–1.2)
BUN: 18 mg/dL (ref 6–23)
CHLORIDE: 107 meq/L (ref 96–112)
CO2: 22 mEq/L (ref 19–32)
CREATININE: 0.96 mg/dL (ref 0.50–1.10)
Calcium: 7.3 mg/dL — ABNORMAL LOW (ref 8.4–10.5)
GFR calc Af Amer: >90 mL/min (ref >90)
GFR calc non Af Amer: 81 mL/min — ABNORMAL LOW (ref >90)
Glucose, Bld: 90 mg/dL (ref 70–99)
Potassium: 4.1 mEq/L (ref 3.7–5.3)
Sodium: 140 mEq/L (ref 137–147)
Total Protein: 3.7 g/dL — ABNORMAL LOW (ref 6.0–8.3)

## 2013-12-30 LAB — CBC
HCT: 28.1 % — ABNORMAL LOW (ref 36.0–46.0)
HEMATOCRIT: 27.9 % — AB (ref 36.0–46.0)
HEMOGLOBIN: 9.7 g/dL — AB (ref 12.0–15.0)
Hemoglobin: 9.5 g/dL — ABNORMAL LOW (ref 12.0–15.0)
MCH: 28.8 pg (ref 26.0–34.0)
MCH: 29.6 pg (ref 26.0–34.0)
MCHC: 33.8 g/dL (ref 30.0–36.0)
MCHC: 34.8 g/dL (ref 30.0–36.0)
MCV: 85.2 fL (ref 78.0–100.0)
MCV: 85.1 fL (ref 78.0–100.0)
PLATELETS: 83 10*3/uL — AB (ref 150–400)
Platelets: 75 10*3/uL — ABNORMAL LOW (ref 150–400)
RBC: 3.3 MIL/uL — AB (ref 3.87–5.11)
RBC: 3.28 MIL/uL — ABNORMAL LOW (ref 3.87–5.11)
RDW: 14.5 % (ref 11.5–15.5)
RDW: 14.6 % (ref 11.5–15.5)
WBC: 14.2 10*3/uL — ABNORMAL HIGH (ref 4.0–10.5)
WBC: 13.8 10*3/uL — ABNORMAL HIGH (ref 4.0–10.5)

## 2013-12-30 LAB — DIC (DISSEMINATED INTRAVASCULAR COAGULATION)PANEL
D-Dimer, Quant: 3.76 ug/mL-FEU — ABNORMAL HIGH (ref 0.00–0.48)
Fibrinogen: 214 mg/dL (ref 204–475)
INR: 1.02 (ref 0.00–1.49)
Platelets: 86 10*3/uL — ABNORMAL LOW (ref 150–400)
Prothrombin Time: 13.2 seconds (ref 11.6–15.2)
aPTT: 29 seconds (ref 24–37)

## 2013-12-30 LAB — PREPARE RBC (CROSSMATCH)

## 2013-12-30 SURGERY — REPAIR, VAGINAL CUFF
Anesthesia: Monitor Anesthesia Care | Site: Vagina

## 2013-12-30 MED ORDER — DIPHENHYDRAMINE HCL 25 MG PO CAPS
25.0000 mg | ORAL_CAPSULE | ORAL | Status: DC | PRN
Start: 2013-12-30 — End: 2014-01-02

## 2013-12-30 MED ORDER — LIDOCAINE HCL (CARDIAC) 20 MG/ML IV SOLN
INTRAVENOUS | Status: AC
  Filled 2013-12-30: qty 5

## 2013-12-30 MED ORDER — SIMETHICONE 80 MG PO CHEW
80.0000 mg | CHEWABLE_TABLET | Freq: Three times a day (TID) | ORAL | Status: DC
  Administered 2013-12-30 – 2014-01-02 (×10): 80 mg via ORAL
  Filled 2013-12-30 (×10): qty 1

## 2013-12-30 MED ORDER — IBUPROFEN 600 MG PO TABS
600.0000 mg | ORAL_TABLET | Freq: Four times a day (QID) | ORAL | Status: DC
  Administered 2013-12-30: 600 mg via ORAL
  Filled 2013-12-30: qty 1

## 2013-12-30 MED ORDER — DIBUCAINE 1 % RE OINT: 1.0000 "application " | TOPICAL_OINTMENT | RECTAL | Status: DC | PRN

## 2013-12-30 MED ORDER — DIPHENHYDRAMINE HCL 12.5 MG/5ML PO ELIX
12.5000 mg | ORAL_SOLUTION | Freq: Four times a day (QID) | ORAL | Status: DC | PRN
  Filled 2013-12-30: qty 5

## 2013-12-30 MED ORDER — MENTHOL 3 MG MT LOZG: 1.0000 | LOZENGE | OROMUCOSAL | Status: DC | PRN

## 2013-12-30 MED ORDER — FENTANYL CITRATE 0.05 MG/ML IJ SOLN: 25.0000 ug | INTRAMUSCULAR | Status: DC | PRN

## 2013-12-30 MED ORDER — SENNOSIDES-DOCUSATE SODIUM 8.6-50 MG PO TABS
2.0000 | ORAL_TABLET | ORAL | Status: DC
  Administered 2013-12-30 – 2013-12-31 (×2): 2 via ORAL
  Filled 2013-12-30: qty 2
  Filled 2013-12-30: qty 1
  Filled 2013-12-30: qty 2

## 2013-12-30 MED ORDER — ONDANSETRON HCL 4 MG/2ML IJ SOLN: 4.0000 mg | INTRAMUSCULAR | Status: DC | PRN

## 2013-12-30 MED ORDER — TETANUS-DIPHTH-ACELL PERTUSSIS 5-2.5-18.5 LF-MCG/0.5 IM SUSP
0.5000 mL | Freq: Once | INTRAMUSCULAR | Status: AC
  Administered 2014-01-01: 0.5 mL via INTRAMUSCULAR
  Filled 2013-12-30 (×2): qty 0.5

## 2013-12-30 MED ORDER — MIDAZOLAM HCL 5 MG/5ML IJ SOLN
INTRAMUSCULAR | Status: DC | PRN
  Administered 2013-12-30: 2 mg via INTRAVENOUS

## 2013-12-30 MED ORDER — PROPOFOL 10 MG/ML IV BOLUS
INTRAVENOUS | Status: DC | PRN
  Administered 2013-12-30: 120 mg via INTRAVENOUS

## 2013-12-30 MED ORDER — NALOXONE HCL 1 MG/ML IJ SOLN
1.0000 ug/kg/h | INTRAVENOUS | Status: DC | PRN
  Filled 2013-12-30: qty 2

## 2013-12-30 MED ORDER — NALBUPHINE HCL 10 MG/ML IJ SOLN
5.0000 mg | INTRAMUSCULAR | Status: DC | PRN
  Filled 2013-12-30: qty 1

## 2013-12-30 MED ORDER — SODIUM CHLORIDE 0.9 % IJ SOLN: 9.0000 mL | INTRAMUSCULAR | Status: DC | PRN

## 2013-12-30 MED ORDER — ONDANSETRON HCL 4 MG/2ML IJ SOLN
INTRAMUSCULAR | Status: DC | PRN
  Administered 2013-12-30: 4 mg via INTRAVENOUS

## 2013-12-30 MED ORDER — ONDANSETRON HCL 4 MG/2ML IJ SOLN: 4.0000 mg | Freq: Three times a day (TID) | INTRAMUSCULAR | Status: DC | PRN

## 2013-12-30 MED ORDER — LANOLIN HYDROUS EX OINT: 1.0000 "application " | TOPICAL_OINTMENT | CUTANEOUS | Status: DC | PRN

## 2013-12-30 MED ORDER — NALOXONE HCL 0.4 MG/ML IJ SOLN
0.4000 mg | INTRAMUSCULAR | Status: DC | PRN
  Administered 2013-12-30: 0.4 mg via INTRAVENOUS
  Filled 2013-12-30: qty 1

## 2013-12-30 MED ORDER — SIMETHICONE 80 MG PO CHEW
80.0000 mg | CHEWABLE_TABLET | ORAL | Status: DC | PRN
  Administered 2013-12-30: 80 mg via ORAL
  Filled 2013-12-30: qty 1

## 2013-12-30 MED ORDER — DIPHENHYDRAMINE HCL 50 MG/ML IJ SOLN: 25.0000 mg | INTRAMUSCULAR | Status: DC | PRN

## 2013-12-30 MED ORDER — NALOXONE HCL 0.4 MG/ML IJ SOLN: 0.4000 mg | INTRAMUSCULAR | Status: DC | PRN

## 2013-12-30 MED ORDER — PRENATAL MULTIVITAMIN CH
1.0000 | ORAL_TABLET | Freq: Every day | ORAL | Status: DC
  Administered 2013-12-31 – 2014-01-01 (×2): 1 via ORAL
  Filled 2013-12-30 (×3): qty 1

## 2013-12-30 MED ORDER — OXYCODONE-ACETAMINOPHEN 5-325 MG PO TABS
1.0000 | ORAL_TABLET | ORAL | Status: DC | PRN
  Administered 2013-12-30 – 2013-12-31 (×7): 2 via ORAL
  Administered 2014-01-01 (×4): 1 via ORAL
  Administered 2014-01-01: 2 via ORAL
  Administered 2014-01-01 – 2014-01-02 (×2): 1 via ORAL
  Filled 2013-12-30 (×3): qty 2
  Filled 2013-12-30: qty 1
  Filled 2013-12-30 (×2): qty 2
  Filled 2013-12-30 (×2): qty 1
  Filled 2013-12-30 (×2): qty 2
  Filled 2013-12-30: qty 1
  Filled 2013-12-30 (×2): qty 2

## 2013-12-30 MED ORDER — ONDANSETRON HCL 4 MG/2ML IJ SOLN
INTRAMUSCULAR | Status: AC
  Filled 2013-12-30: qty 2

## 2013-12-30 MED ORDER — FENTANYL CITRATE 0.05 MG/ML IJ SOLN
INTRAMUSCULAR | Status: DC | PRN
  Administered 2013-12-30 (×2): 50 ug via INTRAVENOUS

## 2013-12-30 MED ORDER — FENTANYL CITRATE 0.05 MG/ML IJ SOLN
INTRAMUSCULAR | Status: AC
  Filled 2013-12-30: qty 2

## 2013-12-30 MED ORDER — DIPHENHYDRAMINE HCL 50 MG/ML IJ SOLN: 12.5000 mg | Freq: Four times a day (QID) | INTRAMUSCULAR | Status: DC | PRN

## 2013-12-30 MED ORDER — LACTATED RINGERS IV SOLN
INTRAVENOUS | Status: DC
  Administered 2013-12-30 – 2013-12-31 (×3): via INTRAVENOUS

## 2013-12-30 MED ORDER — ZOLPIDEM TARTRATE 5 MG PO TABS: 5.0000 mg | ORAL_TABLET | Freq: Every evening | ORAL | Status: DC | PRN

## 2013-12-30 MED ORDER — DIPHENHYDRAMINE HCL 50 MG/ML IJ SOLN: 12.5000 mg | INTRAMUSCULAR | Status: DC | PRN

## 2013-12-30 MED ORDER — MEPERIDINE HCL 25 MG/ML IJ SOLN: 6.2500 mg | INTRAMUSCULAR | Status: DC | PRN

## 2013-12-30 MED ORDER — ONDANSETRON HCL 4 MG/2ML IJ SOLN: 4.0000 mg | Freq: Four times a day (QID) | INTRAMUSCULAR | Status: DC | PRN

## 2013-12-30 MED ORDER — FUROSEMIDE 10 MG/ML IJ SOLN
20.0000 mg | Freq: Two times a day (BID) | INTRAMUSCULAR | Status: DC
  Administered 2013-12-30 – 2014-01-01 (×5): 20 mg via INTRAVENOUS
  Filled 2013-12-30 (×6): qty 2

## 2013-12-30 MED ORDER — ONDANSETRON HCL 4 MG PO TABS: 4.0000 mg | ORAL_TABLET | ORAL | Status: DC | PRN

## 2013-12-30 MED ORDER — DIPHENHYDRAMINE HCL 25 MG PO CAPS: 25.0000 mg | ORAL_CAPSULE | Freq: Four times a day (QID) | ORAL | Status: DC | PRN

## 2013-12-30 MED ORDER — PROPOFOL 10 MG/ML IV EMUL
INTRAVENOUS | Status: AC
  Filled 2013-12-30: qty 20

## 2013-12-30 MED ORDER — MEASLES, MUMPS & RUBELLA VAC ~~LOC~~ INJ
0.5000 mL | INJECTION | Freq: Once | SUBCUTANEOUS | Status: DC
  Filled 2013-12-30: qty 0.5

## 2013-12-30 MED ORDER — METOCLOPRAMIDE HCL 5 MG/ML IJ SOLN: 10.0000 mg | Freq: Three times a day (TID) | INTRAMUSCULAR | Status: DC | PRN

## 2013-12-30 MED ORDER — SODIUM CHLORIDE 0.9 % IJ SOLN: 3.0000 mL | INTRAMUSCULAR | Status: DC | PRN

## 2013-12-30 MED ORDER — SIMETHICONE 80 MG PO CHEW: 80.0000 mg | CHEWABLE_TABLET | ORAL | Status: DC

## 2013-12-30 MED ORDER — MIDAZOLAM HCL 2 MG/2ML IJ SOLN
INTRAMUSCULAR | Status: AC
  Filled 2013-12-30: qty 2

## 2013-12-30 MED ORDER — MAGNESIUM HYDROXIDE 400 MG/5ML PO SUSP
30.0000 mL | ORAL | Status: DC | PRN
  Filled 2013-12-30: qty 30

## 2013-12-30 MED ORDER — METOCLOPRAMIDE HCL 5 MG/ML IJ SOLN: 10.0000 mg | Freq: Once | INTRAMUSCULAR | Status: DC | PRN

## 2013-12-30 MED ORDER — KETOROLAC TROMETHAMINE 30 MG/ML IJ SOLN
INTRAMUSCULAR | Status: AC
  Filled 2013-12-30: qty 2

## 2013-12-30 MED ORDER — WITCH HAZEL-GLYCERIN EX PADS: 1.0000 "application " | MEDICATED_PAD | CUTANEOUS | Status: DC | PRN

## 2013-12-30 MED ORDER — OXYTOCIN 40 UNITS IN LACTATED RINGERS INFUSION - SIMPLE MED
62.5000 mL/h | INTRAVENOUS | Status: AC
  Administered 2013-12-30: 62.5 mL/h via INTRAVENOUS

## 2013-12-30 SURGICAL SUPPLY — 22 items
BAG URINE DRAINAGE (UROLOGICAL SUPPLIES) ×4 IMPLANT
BALLN POSTPARTUM SOS BAKRI (BALLOONS) ×4
BALLOON POSTPARTUM SOS BAKRI (BALLOONS) ×2 IMPLANT
CATH FOLEY 2WAY SLVR 30CC 24FR (CATHETERS) ×4 IMPLANT
CLOTH BEACON ORANGE TIMEOUT ST (SAFETY) ×4 IMPLANT
CONT PATH 16OZ SNAP LID 3702 (MISCELLANEOUS) IMPLANT
DECANTER SPIKE VIAL GLASS SM (MISCELLANEOUS) IMPLANT
GAUZE PACKING 2X5 YD STRL (GAUZE/BANDAGES/DRESSINGS) IMPLANT
GLOVE BIO SURGEON STRL SZ8.5 (GLOVE) ×4 IMPLANT
GLOVE BIOGEL PI IND STRL 6.5 (GLOVE) ×2 IMPLANT
GLOVE BIOGEL PI INDICATOR 6.5 (GLOVE) ×2
GOWN STRL REUS W/TWL 2XL LVL3 (GOWN DISPOSABLE) ×4 IMPLANT
GOWN STRL REUS W/TWL LRG LVL3 (GOWN DISPOSABLE) ×4 IMPLANT
NEEDLE HYPO 22GX1.5 SAFETY (NEEDLE) IMPLANT
NEEDLE SPNL 22GX3.5 QUINCKE BK (NEEDLE) IMPLANT
NS IRRIG 1000ML POUR BTL (IV SOLUTION) ×4 IMPLANT
PACK VAGINAL WOMENS (CUSTOM PROCEDURE TRAY) ×4 IMPLANT
SUT CHROMIC 0 CT 1 (SUTURE) IMPLANT
SUT CHROMIC 2 0 CT 1 (SUTURE) IMPLANT
TOWEL OR 17X24 6PK STRL BLUE (TOWEL DISPOSABLE) ×8 IMPLANT
TRAY FOLEY CATH 14FR (SET/KITS/TRAYS/PACK) ×4 IMPLANT
WATER STERILE IRR 1000ML POUR (IV SOLUTION) ×4 IMPLANT

## 2013-12-30 NOTE — Progress Notes (Signed)
12/30/13 0320  Provider Notification  Reason for Communication Review Case (Narcan given for decreased resp rate/PCA off)  [facility]  Provider Role Anesthesiologist  Method of Communication Call  Response See orders

## 2013-12-30 NOTE — Progress Notes (Signed)
Received pt from the PACU in stable condition with LR @ 125cc/h and 1 unit of PRBC's infusing.

## 2013-12-30 NOTE — Progress Notes (Signed)
Patient ID: Michele Maldonado, female   DOB: 06/23/1986, 28 y.o.   MRN: [id number] Patient is doing well, reporting some abdominal pain. Denies CP, SOB, lightheadedness or dizziness. She has not ambulated yet.  Vital signs and I&O reviewed  Uterine foley removed. Scan staining on vaginal pad Abdominal dressing stained with serosanguinous fluid Vulva: significant decrease in edema- almost normal size Ext: NT, equal in size  A/P 28 yo [id number] POD#1 s/p c-section secondary to pre-eclampsia/nephrotic syndrome - Patient clinically doing well - UOP increasing s/p lasiz - Encourage out of bed with ambulation - continue close monitoring

## 2013-12-30 NOTE — Transfer of Care (Signed)
Immediate Anesthesia Transfer of Care Note  Patient: Michele Maldonado  Procedure(s) Performed: Procedure(s) with comments: REPAIR VAGINAL CUFF (N/A) - Insertion of Bakri Balloon  Patient Location: PACU  Anesthesia Type:General  Level of Consciousness: awake and alert   Airway & Oxygen Therapy: Patient Spontanous Breathing and Patient connected to nasal cannula oxygen  Post-op Assessment: Report given to PACU RN  Post vital signs: Reviewed and stable  Complications: No apparent anesthesia complications

## 2013-12-30 NOTE — Progress Notes (Signed)
Pt to OR for insertion of Bakri balloon

## 2013-12-30 NOTE — Addendum Note (Signed)
Addendum created 12/30/13 1308 by Turner DanielsJennifer L Tijuana Scheidegger, CRNA   Modules edited: Notes Section   Notes Section:  File: [id number]

## 2013-12-30 NOTE — Anesthesia Postprocedure Evaluation (Signed)
Anesthesia Post Note  Patient: Michele Maldonado  Procedure(s) Performed: Procedure(s) (LRB): REPAIR VAGINAL CUFF (N/A)  Anesthesia type: General  Patient location: PACU  Post pain: Pain level controlled  Post assessment: Post-op Vital signs reviewed  Last Vitals:  Filed Vitals:   12/30/13 0930  BP: 147/75  Pulse:   Temp: 37.2 C  Resp: 16    Post vital signs: Reviewed  Level of consciousness: sedated  Complications: No apparent anesthesia complications

## 2013-12-30 NOTE — Progress Notes (Addendum)
12/30/13 0515  Provider Notification  Reason for Communication Review Case (total EBL (lochia+inc), lab results, UOP)  [facility]  Provider Role Attending physician  Method of Communication Call  Response See orders (MD to place Bakri balloon, transfuse)  Notification time was 0550 not 0515

## 2013-12-30 NOTE — Anesthesia Preprocedure Evaluation (Addendum)
Anesthesia Evaluation  Patient identified by MRN, date of birth, ID band Patient awake    Reviewed: Allergy & Precautions, H&P , NPO status , Patient's Chart, lab work & pertinent test results, reviewed documented beta blocker date and time   History of Anesthesia Complications Negative for: history of anesthetic complications  Airway Mallampati: III TM Distance: >3 FB Neck ROM: full    Dental  (+) Teeth Intact   Pulmonary neg pulmonary ROS,  breath sounds clear to auscultation        Cardiovascular hypertension (preeclampsia), Rhythm:regular Rate:Normal     Neuro/Psych negative neurological ROS  negative psych ROS   GI/Hepatic negative GI ROS,   Endo/Other  BMI 32  Renal/GU Severe nephrotic syndrome     Musculoskeletal   Abdominal   Peds  Hematology  (+) Blood dyscrasia (thrombocytopenia plt - 88, anemia  - hgb 9.5), , Was on lovenox, then heparin gtt until 1:50 pm 12/29/13  PT/PTT wnl @ 2335 12/29/13    Anesthesia Other Findings NPO since 9 am  Reproductive/Obstetrics (+) Pregnancy (no PNC, believed to be around 35 weeks.  s/p C/S on 2/14 with continued PPH)                          Anesthesia Physical  Anesthesia Plan  ASA: III and emergent  Anesthesia Plan: MAC   Post-op Pain Management:    Induction:   Airway Management Planned:   Additional Equipment:   Intra-op Plan:   Post-operative Plan:   Informed Consent: I have reviewed the patients History and Physical, chart, labs and discussed the procedure including the risks, benefits and alternatives for the proposed anesthesia with the patient or authorized representative who has indicated his/her understanding and acceptance.     Plan Discussed with: Surgeon and CRNA  Anesthesia Plan Comments:        Anesthesia Quick Evaluation

## 2013-12-30 NOTE — Progress Notes (Signed)
Attempted visit with mother.  Informed that she was taken back to the OR.  CSW will continue to follow.

## 2013-12-30 NOTE — Lactation Note (Signed)
This note was copied from the chart of Michele Maldonado. Lactation Consultation Note  Patient Name: Michele Maldonado ZOXWR'UToday's Date: 12/30/2013 Reason for consult: Initial assessment;NICU baby;Infant < 6lbs;Other (Comment) (mom in AICU). Mom is a multipara with experience breastfeeding 3 other children for one month each but this is her first experience with NICU baby and need to pump.  RN caring for mom had already initiated use of DEBP and mom obtaining drops of colostrum already.  Mom given NICU pumping booklet and LC Resource brochure and LC reviewed WH services and list of community and web site resources. LC encouraged review of Baby and Me pp 9, 14 and 20-25 for  STS and additional BF information. Mom states she knows how to hand express and LC encouraged her to combine regular pumping every 3 hours with breast massage and hand expression.  LC also discussed NICU LC availability tomorrow for follow-up. Maternal Data Formula Feeding for Exclusion: Yes Reason for exclusion: Admission to Intensive Care Unit (ICU) post-partum (mom and baby in Intensive Care units) Infant to breast within first hour of birth: No Breastfeeding delayed due to:: Maternal status;Infant status Has patient been taught Hand Expression?: Yes Does the patient have breastfeeding experience prior to this delivery?: Yes  Feeding Feeding Type: Formula Length of feed: 10 min  LATCH Score/Interventions           N/A -- baby in NICU           Lactation Tools Discussed/Used   DEBP, hand expression, breast massage NICU pumping booklet STS  Consult Status Consult Status: Follow-up Date: 12/31/13 Follow-up type: In-patient    Warrick ParisianBryant, Sephiroth Mcluckie [facility] 12/30/2013, 8:57 PM

## 2013-12-30 NOTE — Anesthesia Postprocedure Evaluation (Signed)
  Anesthesia Post-op Note  Anesthesia Post Note  Patient: Michele Maldonado  Procedure(s) Performed: Procedure(s) (LRB): REPAIR VAGINAL CUFF (N/A)  Anesthesia type: General  Patient location: Women's Unit  Post pain: Pain level controlled  Post assessment: Post-op Vital signs reviewed  Last Vitals:  Filed Vitals:   12/30/13 1251  BP:   Pulse: 80  Temp: 36.9 C  Resp: 18    Post vital signs: Reviewed  Level of consciousness: sedated  Complications: No apparent anesthesia complications

## 2013-12-30 NOTE — Op Note (Signed)
12/26/2013 - 12/30/2013  7:49 AM  PATIENT:  Michele Maldonado  28 y.o. female  PRE-OPERATIVE DIAGNOSIS:  Bleeding, post partum  POST-OPERATIVE DIAGNOSIS:  same  PROCEDURE:  Procedure(s) with comments: REPAIR VAGINAL CUFF (N/A) - Insertion of Bakri Balloon and then insertion of Foley catheter  SURGEON:  Surgeon(s) and Role:    * Allie BossierMyra C Calise Dunckel, MD - Primary    * Kathreen CosierBernard A Marshall, MD - Assisting  PHYSICIAN ASSISTANT:   ASSISTANTS: none   ANESTHESIA:   MAC and then LMA  EBL:  Total I/O In: 100 [I.V.:100] Out: -   BLOOD ADMINISTERED:none  DRAINS: none   LOCAL MEDICATIONS USED:  NONE  SPECIMEN:  No Specimen  DISPOSITION OF SPECIMEN:  N/A  COUNTS:  YES  TOURNIQUET:  * No tourniquets in log *  DICTATION: .Dragon Dictation  PLAN OF CARE: return to AICU  PATIENT DISPOSITION:  PACU - hemodynamically stable.   Delay start of Pharmacological VTE agent (>24hrs) due to surgical blood loss or risk of bleeding: not applicable  The risks, benefits, and alternatives were explained understood and accepted. Consents were signed.in the operating room she was placed in dorsolithotomy position. MAC anesthesia was initially given but she could not tolerate the prep and LMA anesthesia was then given. Her vagina was prepped and draped in the usual sterile fashion a bimanual exam revealed and well involuted uterus with approximately 50 cc worth of clots removed. I then placed the back of the bladder and and injected approximately 50 cc of saline. I then changed her dressing of her abdomen. As soon as the anesthesia was started to be reversed the patient coughed and tobacco balloon fell out. I felt that tobacco balloon was too large for the size of her uterus. I therefore used a large three-way Foley catheter and injected approximately 30 cc of saline in the bladder and she was then extubated and taken to recovery room stable condition. She tolerated the procedure well.

## 2013-12-30 NOTE — Progress Notes (Signed)
As instructed by Pharmacist, Ovidio HangerAnn Mason,  10cc(3mg ) Dilaudid PCA was wasted in sink. Unable to find medication in Pyxis to document waste. Pt received 15cc(4.5mg ) prior to discontinuing medication. Witnessed by Jose Persiaarol Mead, RN.

## 2013-12-30 NOTE — Progress Notes (Signed)
12/30/13 0135  Provider Notification  Reason for Communication Review Case (UOP, IVF's, bleeding improving, dressing/drainage)  [facility]  Provider Role Attending physician  Method of Communication Call  Response See orders (penrose drain in LTA incision - drainage expected)

## 2013-12-30 NOTE — Progress Notes (Signed)
12/30/13 1006  Urethral Catheter Dr. Marice Potterove 24 Fr.  Placement Date/Time: 12/30/13 0745   Person Inserting Catheter: Dr. Marice Potterove  Patient Location at Time of Insertion: OR  Tube Size (Fr.): 24 Fr.  Catheter Balloon Size: 30 mL  Urine Returned: Other (Comment)  Indication for Insertion or Continuance of Catheter Other (comment) (this an intrauterine catheter)  Site Assessment Clean;Intact  Catheter Maintenance Bag below level of bladder;Catheter secured;Drainage bag/tubing not touching floor;Insertion date on drainage bag;No dependent loops;Seal intact;Bag emptied prior to Industrial/product designertransport  Collection Container Standard drainage bag  Securement Method Securing device (Describe) (stat lock)  This catheter is a 24 fr. 30cc intrauterine device.

## 2013-12-31 ENCOUNTER — Encounter (HOSPITAL_COMMUNITY): Payer: Self-pay | Admitting: Obstetrics & Gynecology

## 2013-12-31 LAB — CBC
HCT: 27.1 % — ABNORMAL LOW (ref 36.0–46.0)
Hemoglobin: 9.4 g/dL — ABNORMAL LOW (ref 12.0–15.0)
MCH: 29.3 pg (ref 26.0–34.0)
MCHC: 34.7 g/dL (ref 30.0–36.0)
MCV: 84.4 fL (ref 78.0–100.0)
PLATELETS: 82 10*3/uL — AB (ref 150–400)
RBC: 3.21 MIL/uL — ABNORMAL LOW (ref 3.87–5.11)
RDW: 14.8 % (ref 11.5–15.5)
WBC: 15.6 10*3/uL — AB (ref 4.0–10.5)

## 2013-12-31 LAB — COMPREHENSIVE METABOLIC PANEL
ALBUMIN: 1.2 g/dL — AB (ref 3.5–5.2)
ALT: 13 U/L (ref 0–35)
AST: 27 U/L (ref 0–37)
Alkaline Phosphatase: 88 U/L (ref 39–117)
BILIRUBIN TOTAL: 0.2 mg/dL — AB (ref 0.3–1.2)
BUN: 16 mg/dL (ref 6–23)
CHLORIDE: 104 meq/L (ref 96–112)
CO2: 25 meq/L (ref 19–32)
Calcium: 7.3 mg/dL — ABNORMAL LOW (ref 8.4–10.5)
Creatinine, Ser: 0.97 mg/dL (ref 0.50–1.10)
GFR calc Af Amer: >90 mL/min (ref >90)
GFR, EST NON AFRICAN AMERICAN: 80 mL/min — AB (ref >90)
GLUCOSE: 76 mg/dL (ref 70–99)
Potassium: 4.2 mEq/L (ref 3.7–5.3)
Sodium: 138 mEq/L (ref 137–147)
Total Protein: 3.9 g/dL — ABNORMAL LOW (ref 6.0–8.3)

## 2013-12-31 LAB — PREPARE FRESH FROZEN PLASMA: Unit division: 0

## 2013-12-31 LAB — ANA: ANA: NEGATIVE

## 2013-12-31 NOTE — Lactation Note (Signed)
This note was copied from the chart of [name]. Lactation Consultation Note  Patient Name: [name] [id number]'XToday's Date: 12/31/2013  [name] states her baby is doing well in the NICU. She has pumped 3 times since yesterday and expressed a small amount of colostrum. She plans to increase her pumping to 8/24 to stimulate her milk production. Mother has breast fed her other children for 1 month and stopped because they were getting formula. She does not have a breast pump and has not registered with WIC.  She  was instructed to contact WIC to schedule an appointment and discussed the process for obtaining a loaner pump through the hospital. Mother verbalized understanding. LC to follow.   Maternal Data    Feeding Feeding Type: Formula Nipple Type: Slow - flow Length of feed: 15 min  LATCH Score/Interventions                      Lactation Tools Discussed/Used     Consult Status      Christella HartiganDaly, Jamale Spangler M 12/31/2013, 3:13 PM

## 2013-12-31 NOTE — Progress Notes (Signed)
CSW attempted to meet with [name].  She was pumping at this time and had 3 visitors present.  CSW asked if [name] would like CSW to return at a later time and she said yes.  An older woman, who appeared to possibly be her mother, stated that she and the two other visitors (2 young girls) could step out so we could talk now, but [name] still stated that she would like CSW to return at a later time.  She then stated we could talk now with her visitors present.  CSW would prefer to talk with [name] privately if possible so CSW agreed to return later this afternoon or in the morning.  [name] agreed. 

## 2013-12-31 NOTE — Progress Notes (Signed)
Renal  She has proteinuria in the settign of pregnancy.  She has severe hypoalbuminemia and thrombocytopenia. ANA is pending.  BP is generous. > 4000cc UOP yesterday with furosemide. Leg swelling has improved per her history and exam unimpresive now.  Asses: Proteinuria, pregnancy associated             Severe hypoalbuminemia             Thrombocytopenia             S/P Maldonado- section  Plan: Suggest taper diuretics; cont low sodium diet           We can see in office in 3 months with a 24 hour urine for protein and creatinine prior to visit.            We will contact her regarding the visit date.. Will sign off. Michele Maldonado

## 2013-12-31 NOTE — Progress Notes (Signed)
Subjective: Postpartum Day 2: Cesarean Delivery Patient reports incisional pain and tolerating PO.    Objective: Vital signs in last 24 hours: Temp:  [98.1 F (36.7 C)-100.1 F (37.8 C)] 99.8 F (37.7 C) (02/16 0738) Pulse Rate:  [66-96] 79 (02/16 0738) Resp:  [16-20] 18 (02/16 0738) BP: (117-160)/(77-103) 140/83 mmHg (02/16 0738) SpO2:  [93 %-100 %] 97 % (02/16 0738) Weight:  [203 lb 14.4 oz (92.488 kg)] 203 lb 14.4 oz (92.488 kg) (02/15 1006)  Physical Exam:  General: alert, cooperative and no distress Lochia: appropriate Uterine Fundus: firm Incision: healing well, serosanguinous drainage from penrose drain, removed DVT Evaluation: No evidence of DVT seen on physical exam.   Recent Labs  12/30/13 1652 12/31/13 0535  HGB 9.7* 9.4*  HCT 27.9* 27.1*    Intake/Output Summary (Last 24 hours) at 12/31/13 0959 Last data filed at 12/31/13 0944  Gross per 24 hour  Intake 5011.01 ml  Output   4106 ml  Net 905.01 ml     Assessment/Plan: Status post Cesarean section. Doing well postoperatively. swelling improved  Continue current care. D/C Foley, ambulate. Move to Women's Unit  Adam PhenixJames G Arnold, MD 12/31/2013   ARNOLD,JAMES 12/31/2013, 9:59 AM

## 2014-01-01 ENCOUNTER — Encounter (HOSPITAL_COMMUNITY): Payer: Self-pay | Admitting: Obstetrics & Gynecology

## 2014-01-01 LAB — TYPE AND SCREEN
ABO/RH(D): B POS
Antibody Screen: NEGATIVE
UNIT DIVISION: 0
UNIT DIVISION: 0
UNIT DIVISION: 0
UNIT DIVISION: 0
UNIT DIVISION: 0
Unit division: 0
Unit division: 0
Unit division: 0
Unit division: 0
Unit division: 0
Unit division: 0
Unit division: 0
Unit division: 0
Unit division: 0

## 2014-01-01 LAB — CBC
HEMATOCRIT: 31.7 % — AB (ref 36.0–46.0)
HEMOGLOBIN: 10.9 g/dL — AB (ref 12.0–15.0)
MCH: 29.2 pg (ref 26.0–34.0)
MCHC: 34.4 g/dL (ref 30.0–36.0)
MCV: 85 fL (ref 78.0–100.0)
Platelets: 94 10*3/uL — ABNORMAL LOW (ref 150–400)
RBC: 3.73 MIL/uL — ABNORMAL LOW (ref 3.87–5.11)
RDW: 15.3 % (ref 11.5–15.5)
WBC: 16.2 10*3/uL — ABNORMAL HIGH (ref 4.0–10.5)

## 2014-01-01 LAB — COMPREHENSIVE METABOLIC PANEL
ALK PHOS: 98 U/L (ref 39–117)
ALT: 17 U/L (ref 0–35)
AST: 38 U/L — ABNORMAL HIGH (ref 0–37)
Albumin: 1.3 g/dL — ABNORMAL LOW (ref 3.5–5.2)
BILIRUBIN TOTAL: 0.5 mg/dL (ref 0.3–1.2)
BUN: 12 mg/dL (ref 6–23)
CO2: 25 mEq/L (ref 19–32)
Calcium: 7.5 mg/dL — ABNORMAL LOW (ref 8.4–10.5)
Chloride: 99 mEq/L (ref 96–112)
Creatinine, Ser: 0.82 mg/dL (ref 0.50–1.10)
GFR calc non Af Amer: >90 mL/min (ref >90)
GLUCOSE: 85 mg/dL (ref 70–99)
POTASSIUM: 4 meq/L (ref 3.7–5.3)
SODIUM: 134 meq/L — AB (ref 137–147)
Total Protein: 4.6 g/dL — ABNORMAL LOW (ref 6.0–8.3)

## 2014-01-01 MED ORDER — FUROSEMIDE 40 MG PO TABS
40.0000 mg | ORAL_TABLET | Freq: Two times a day (BID) | ORAL | Status: DC
  Filled 2014-01-01: qty 1

## 2014-01-01 MED ORDER — FUROSEMIDE 40 MG PO TABS
40.0000 mg | ORAL_TABLET | Freq: Once | ORAL | Status: AC
  Administered 2014-01-01: 40 mg via ORAL
  Filled 2014-01-01: qty 1

## 2014-01-01 NOTE — Lactation Note (Signed)
This note was copied from the chart of [name]. Lactation Consultation Note    Follow up consult with this mom of a NICU baby, now 34 2/7 weeks corrected gestation, and 2369 hours old. Mom is transitioning into mature milk, and expressing small amounts (drops), with hand expression. Mom has been sick , and I told her her milk may be delayed in transitioning in. [facility], and then she will get a loaner DEP.  Patient Name: [name] BMWUX'LToday's Date: 01/01/2014 Reason for consult: Follow-up assessment;NICU baby;Late preterm infant   Maternal Data    Feeding Feeding Type: Formula Nipple Type: Slow - flow Length of feed: 30 min  LATCH Score/Interventions                      Lactation Tools Discussed/Used WIC Program: No (mom needs to call for  application, will need loaner DEP) Pump Review: Setup, frequency, and cleaning;Milk Storage;Other (comment) (hand expression and reviewe of NICU booklet. Mom will need loaner DEP) Initiated by:: bedside RN Date initiated:: 01/03/14   Consult Status Consult Status: Follow-up Date: 01/02/14 Follow-up type: In-patient    Alfred LevinsLee, Aquila Menzie Anne 01/01/2014, 3:41 PM

## 2014-01-01 NOTE — Progress Notes (Signed)
Clinical Social Work Department PSYCHOSOCIAL ASSESSMENT - MATERNAL/CHILD 06/09/14  Patient:  Michele Maldonado, Michele Maldonado  Account Number:  [id number]  Admit Date:  30-Oct-2014  Ardine Eng Name:   Michele Maldonado    Clinical Social Worker:  Terri Piedra, LCSW   Date/Time:  December 22, 2013 11:30 AM  Date Referred:  September 15, 2014   Referral source  Physician     Referred reason  [facility]   Other referral source:    I:  FAMILY / HOME ENVIRONMENT Child's legal guardian:  PARENT  Guardian - Name Guardian - Age Guardian - Address  Michele Maldonado [address] [place], [place] [address]  [place]   Other household support members/support persons Name Relationship DOB   SISTER 7   BROTHER 5   BROTHER 5   Other support:   [name] states her mother, father, gramma and sisters are supportive, however they live in [place].  She has a close friend in [place] who is a good support person, as well as her friend's mother and a neighbor who help her with her children.    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  She states she will have 3 weeks off for maternity leave.   Financial resources:  Medicaid If Medicaid - County:  [employer] / Grade:   Maternity Care Coordinator / Child Services Coordination / Early Interventions:   [place]  Cultural issues impacting care:   None stated    III  STRENGTHS Strengths  Adequate Resources  Home prepared for Child (including basic supplies)  Other - See comment  Supportive family/friends  Compliance with medical plan  Understanding of illness   Strength comment:  Pediatric follow up will be at [facility], where [name]'s other children go.   IV  RISK FACTORS AND CURRENT PROBLEMS Current Problem:  YES   Risk Factor & Current Problem Patient Issue Family Issue Risk Factor / Current Problem Comment   N N [name] denies substance use,  however, her UDS was positive for THC.   N N     V  SOCIAL WORK ASSESSMENT   CSW met with [name] in her third floor room/306 to introduce myself, offer support and complete assessment due to NICU admission.  [name] was not overly welcoming, but stated this was a fine time to talk and invited CSW into her room.  She states she is feeling much better.  She states she was somewhat hoping that she would be able to go home today, but added that she does not want to go home too soon and end up back in the hospital.  CSW expressed sympathy for what she has been through this past week, but commended her for having the attitude that she wants to ensure she is healthy before going home.  [name] seems to have a good understanding of baby's need for NICU care at this time due to his prematurity and is coping well with that at this time because she so adamantly wanted him to be delivered because of the pain she was in.  She states that he is now on a "calming medication" and asked CSW how long he will be in the hospital.  CSW took this time to discuss [facility], hospital drug screen policy, [name]'s positive UDS and any other substances she may have had in her system during pregnancy.  She states she was in denial about the pregnancy and  was contemplating abortion until it was too late.  She states she didn't go to the doctor because she knew she would get attached when she saw baby on the ultrasound screen.  She states she fell in love with him when she saw him last week and that she loves him now.  She fully intends to parent him and does not seem to regret not having an abortion.  She is understanding of the hospital policy, but states she felt like a nurse was accusing her of drug use when questioning her yesterday.  CSW apologized that she felt this way, but explained the importance for NICU staff to know any substance that baby may have been exposed to while in utero, either prescribed or not.  She states she was on lots of pain  killers for the past week because of her medical situation and feels this is why baby may be experiencing signs of withdrawal.  CSW asked [name] about marijuana use due to her positive UDS for THC.  She denies all drug use.  [place] is "around it" (marijuana) frequently, but adamantly denies use.  She seemed frustrated when CSW told her of this positive screen.  CSW also informed her CSW's mandated reporting for positive baby screens and informed [name] that baby's UDS was negative, MDS pending.  She asked that CSW inform her of MDS results, which CSW agreed to.  She states no hx with CPS here or in [place].  She states she moved here approximately 2.5 years ago when her cousin convinced her it was better here.  She states her cousin has now gone back to [place] states she likes the fact that it is cheaper here, but states she might want to go back to [place] at some point since her family is there.  CSW asked about FOB.  [name] states the situation is stressful and they are not currently together.  She states he is the father of all of her children.  He knows the baby has been born, but she states she has not provided him with much information at this time.  He is currently in [place], where she is from.  [name] states her mother is here visiting currently with her two sisters and has gone out to buy baby items.  [name] states she just told her mother last week about the pregnancy.  She states her parents (not together, but both living in [place]) took the news well and are both supportive.  [name] states her grandmother will be coming here to help her once her mother goes back home to [place] asked if [name] has any hx of PPD with her last children.  She states no hx and no concerns at this time.  CSW reviewed signs and symptoms and asked [name] to commit to talking with CSW or her doctor if she has concerns about her emotions at any time.  She agreed.  CSW informed [name] of ongoing support services offered by  NICU CSW and gave her contact information.  CSW will continue to monitor and provide support/assistance as needed/desired.  [name] states no issues with transportation in order to visit baby after her discharge.     VI SOCIAL WORK PLAN Social Work Plan  Psychosocial Support/Ongoing Assessment of Needs  Patient/Family Education   Type of pt/family education:   Ongoing support offered by NICU CSW  PPD signs and symptoms  Hospital drug screen policy  Informed  [name] of referral that will be made to [facility] due to NICU admission   If child protective services report - county:   If child protective services report - date:   Information/referral to community resources comment:   [facility]   Other social work plan:   

## 2014-01-01 NOTE — Progress Notes (Signed)
UR completed 

## 2014-01-01 NOTE — Progress Notes (Signed)
Subjective: Postpartum Day 3: Cesarean Delivery Patient reports no problems voiding.  She reports that she has very little appetite and she has not passed flatus.  She denies n/v.  She does report blurred vision.  NO SOB. Has ambulated but, only in the room.  Objective: Vital signs in last 24 hours: Temp:  [98.2 F (36.8 C)-100 F (37.8 C)] 98.8 F (37.1 C) (02/17 0618) Pulse Rate:  [74-112] 105 (02/17 0618) Resp:  [16-18] 18 (02/17 0618) BP: (140-168)/(83-99) 147/96 mmHg (02/17 0618) SpO2:  [97 %-100 %] 97 % (02/17 0618)  Physical Exam:  General: alert and mild distress Lochia: appropriate Uterine Fundus: unable to feel due to abd distension Abd: distended; no rebound or guarding.  GU: vulva: edema much improved since admission Incision: healing well, no significant drainage, no dehiscence, no significant erythema DVT Evaluation: Calf/Ankle edema is present. The extremities are improved since admssion   Recent Labs  12/30/13 1652 12/31/13 0535  HGB 9.7* 9.4*  HCT 27.9* 27.1*   CBC    Component Value Date/Time   WBC 15.6* 12/31/2013 0535   RBC 3.21* 12/31/2013 0535   HGB 9.4* 12/31/2013 0535   HCT 27.1* 12/31/2013 0535   PLT 82* 12/31/2013 0535   MCV 84.4 12/31/2013 0535   MCH 29.3 12/31/2013 0535   MCHC 34.7 12/31/2013 0535   RDW 14.8 12/31/2013 0535   LYMPHSABS 2.9 12/26/2013 1445   MONOABS 0.7 12/26/2013 1445   EOSABS 0.0 12/26/2013 1445   BASOSABS 0.0 12/26/2013 1445   CMP     Component Value Date/Time   NA 138 12/31/2013 0535   K 4.2 12/31/2013 0535   CL 104 12/31/2013 0535   CO2 25 12/31/2013 0535   GLUCOSE 76 12/31/2013 0535   BUN 16 12/31/2013 0535   CREATININE 0.97 12/31/2013 0535   CALCIUM 7.3* 12/31/2013 0535   PROT 3.9* 12/31/2013 0535   ALBUMIN 1.2* 12/31/2013 0535   AST 27 12/31/2013 0535   ALT 13 12/31/2013 0535   ALKPHOS 88 12/31/2013 0535   BILITOT 0.2* 12/31/2013 0535   GFRNONAA 80* 12/31/2013 0535   GFRAA >90 12/31/2013 0535      Assessment/Plan: POD #2  s/p c-section for severe preeclampsia and nephrotic syndrome.  Pt gradually improving. Adynamic leus- rec ambulation today. Advance diet with flatus  Continue current care CBC and CMP today Pt pumping  HARRAWAY-SMITH, Burtis Imhoff 01/01/2014, 7:28 AM

## 2014-01-02 LAB — CBC
HCT: 27.5 % — ABNORMAL LOW (ref 36.0–46.0)
Hemoglobin: 9.3 g/dL — ABNORMAL LOW (ref 12.0–15.0)
MCH: 29.5 pg (ref 26.0–34.0)
MCHC: 33.8 g/dL (ref 30.0–36.0)
MCV: 87.3 fL (ref 78.0–100.0)
PLATELETS: 84 10*3/uL — AB (ref 150–400)
RBC: 3.15 MIL/uL — ABNORMAL LOW (ref 3.87–5.11)
RDW: 16.1 % — ABNORMAL HIGH (ref 11.5–15.5)
WBC: 16.3 10*3/uL — AB (ref 4.0–10.5)

## 2014-01-02 LAB — COMPREHENSIVE METABOLIC PANEL
ALBUMIN: 1.1 g/dL — AB (ref 3.5–5.2)
ALT: 12 U/L (ref 0–35)
AST: 23 U/L (ref 0–37)
Alkaline Phosphatase: 86 U/L (ref 39–117)
BILIRUBIN TOTAL: 0.3 mg/dL (ref 0.3–1.2)
BUN: 10 mg/dL (ref 6–23)
CHLORIDE: 102 meq/L (ref 96–112)
CO2: 26 mEq/L (ref 19–32)
Calcium: 7.4 mg/dL — ABNORMAL LOW (ref 8.4–10.5)
Creatinine, Ser: 0.7 mg/dL (ref 0.50–1.10)
GFR calc Af Amer: >90 mL/min (ref >90)
GFR calc non Af Amer: >90 mL/min (ref >90)
Glucose, Bld: 90 mg/dL (ref 70–99)
Potassium: 3.4 mEq/L — ABNORMAL LOW (ref 3.7–5.3)
Sodium: 137 mEq/L (ref 137–147)
Total Protein: 4.2 g/dL — ABNORMAL LOW (ref 6.0–8.3)

## 2014-01-02 MED ORDER — FUROSEMIDE 20 MG PO TABS
20.0000 mg | ORAL_TABLET | Freq: Two times a day (BID) | ORAL | Status: DC
  Administered 2014-01-02: 20 mg via ORAL
  Filled 2014-01-02 (×3): qty 1

## 2014-01-02 MED ORDER — OXYCODONE-ACETAMINOPHEN 5-325 MG PO TABS: 1.0000 | ORAL_TABLET | ORAL | Status: DC | PRN

## 2014-01-02 NOTE — Progress Notes (Signed)
Pt is discharged in the care of Mother. Downstairs per ambulatory with N>T> escort. Denies any pain or discomfort, or heavy  Vaginal  Bleeding. Infant to remain in [place]. Abdominal incision is clean and dry.. Emotional support given due to separation from infant.

## 2014-01-02 NOTE — Discharge Summary (Signed)
Obstetric Discharge Summary Reason for Admission: observation/evaluation Prenatal Procedures: ultrasound Intrapartum Procedures: cesarean: low cervical, transverse Postpartum Procedures: none Complications-Operative and Postpartum: Proteinuria and swelling, possible nephrotic syndrome Hemoglobin  Date Value Ref Range Status  01/02/2014 9.3* 12.0 - 15.0 g/dL Final     HCT  Date Value Ref Range Status  01/02/2014 27.5* 36.0 - 46.0 % Final    Physical Exam:  General: alert, cooperative and no distress Lochia: appropriate Uterine Fundus: firm Incision: healing well DVT Evaluation: No evidence of DVT seen on physical exam.  Discharge Diagnoses: premature delivery due to severe proteinuria, swelling  Discharge Information: Date: 01/02/2014 Activity: no heavy lifting, pelvic rest Diet: routine Medications: Percocet, HCTZ 25 mg daily Condition: improved Instructions: refer to practice specific booklet Discharge to: home Follow-up Information   Follow up with [facility] In 2 weeks.   Contact information:   [address] [place] [address] [phone]       Newborn Data: Live born female  Birth Weight: 4 lb 5.1 oz (1960 g) APGAR: 9, 9  Home with mother.  Coumba Kellison 01/02/2014, 3:26 PM

## 2014-01-02 NOTE — Lactation Note (Signed)
This note was copied from the chart of Michele Maldonado. Lactation Consultation Note      Follow up consult with this mom of a NICU baby, now 94 hours post partum, and baby 34 3/7 weeks corrected gestation. I loaned mom a DEP, after she called for a [facility] appointment. Mom is getting small amount so f transitional milk. She is on HCTZ, with a lot of edema still present , from her waist down. I will follow this mom and baby, in the NICU.  Patient Name: Michele Bera Roxan Maldonado ZOXWR'UToday's Date: 01/02/2014 Reason for consult: Follow-up assessment;NICU baby   Maternal Data    Feeding Feeding Type: Breast Milk with Formula added Nipple Type: Slow - flow Length of feed: 40 min (20"PO/ 20"NG)  LATCH Score/Interventions                      Lactation Tools Discussed/Used WIC Program: Yes (mom has appointment for 2/25 )   Consult Status Consult Status: PRN Follow-up type:  (in NICU)    Alfred LevinsLee, Bearl Talarico Anne 01/02/2014, 4:31 PM

## 2014-01-02 NOTE — Progress Notes (Signed)
Post Partum Day 4 Subjective: voiding, tolerating PO, + flatus and pt complains of vulvar swelling after voiding yesterday. Pt feels legs are improving. Pt did not record I/O yesterday but has continued to urinate. Pt without significant other complaints at this time  Objective: Blood pressure 151/88, pulse 95, temperature 98.3 F (36.8 C), temperature source Oral, resp. rate 16, height 5\' 7"  (1.702 m), weight 92.488 kg (203 lb 14.4 oz), last menstrual period 04/18/2013, SpO2 95.00%, unknown if currently breastfeeding.  Physical Exam:  General: alert, cooperative, appears stated age and no distress Lochia: appropriate Uterine Fundus: firm Incision: healing well, no significant drainage, no dehiscence, no significant erythema DVT Evaluation: No evidence of DVT seen on physical exam. Negative Homan's sign. No cords or calf tenderness. Calf/Ankle edema is present up to bilateral knees.    Recent Labs  01/01/14 0751 01/02/14 0630  HGB 10.9* 9.3*  HCT 31.7* 27.5*    Assessment/Plan: Discharge home Discussed with nephrology and recommend to discharge on HCTZ 25mg  qday.  Will likely discharge this afternoon.   LOS: 7 days   Zianne Schubring RYAN 01/02/2014, 9:35 AM

## 2014-01-02 NOTE — Discharge Instructions (Signed)
Cesarean Delivery °Care After °Refer to this sheet in the next few weeks. These instructions provide you with information on caring for yourself after your procedure. Your health care provider may also give you specific instructions. Your treatment has been planned according to current medical practices, but problems sometimes occur. Call your health care provider if you have any problems or questions after you go home. °HOME CARE INSTRUCTIONS  °· Only take over-the-counter or prescription medications as directed by your health care provider. °· Do not drink alcohol, especially if you are breastfeeding or taking medication to relieve pain. °· Do not chew or smoke tobacco. °· Continue to use good perineal care. Good perineal care includes: °· Wiping your perineum from front to back. °· Keeping your perineum clean. °· Check your surgical cut (incision) daily for increased redness, drainage, swelling, or separation of skin. °· Clean your incision gently with soap and water every day, and then pat it dry. If your health care provider says it is OK, leave the incision uncovered. Use a bandage (dressing) if the incision is draining fluid or appears irritated. If the adhesive strips across the incision do not fall off within 7 days, carefully peel them off. °· Hug a pillow when coughing or sneezing until your incision is healed. This helps to relieve pain. °· Do not use tampons or douche until your health care provider says it is okay. °· Shower, wash your hair, and take tub baths as directed by your health care provider. °· Wear a well-fitting bra that provides breast support. °· Limit wearing support panties or control-top hose. °· Drink enough fluids to keep your urine clear or pale yellow. °· Eat high-fiber foods such as whole grain cereals and breads, brown rice, beans, and fresh fruits and vegetables every day. These foods may help prevent or relieve constipation. °· Resume activities such as climbing stairs,  driving, lifting, exercising, or traveling as directed by your health care provider. °· Talk to your health care provider about resuming sexual activities. This is dependent upon your risk of infection, your rate of healing, and your comfort and desire to resume sexual activity. °· Try to have someone help you with your household activities and your newborn for at least a few days after you leave the hospital. °· Rest as much as possible. Try to rest or take a nap when your newborn is sleeping. °· Increase your activities gradually. °· Keep all of your scheduled postpartum appointments. It is very important to keep your scheduled follow-up appointments. At these appointments, your health care provider will be checking to make sure that you are healing physically and emotionally. °SEEK MEDICAL CARE IF:  °· You are passing large clots from your vagina. Save any clots to show your health care provider. °· You have a foul smelling discharge from your vagina. °· You have trouble urinating. °· You are urinating frequently. °· You have pain when you urinate. °· You have a change in your bowel movements. °· You have increasing redness, pain, or swelling near your incision. °· You have pus draining from your incision. °· Your incision is separating. °· You have painful, hard, or reddened breasts. °· You have a severe headache. °· You have blurred vision or see spots. °· You feel sad or depressed. °· You have thoughts of hurting yourself or your newborn. °· You have questions about your care, the care of your newborn, or medications. °· You are dizzy or lightheaded. °· You have a rash. °· You   have pain, redness, or swelling at the site of the removed intravenous access (IV) tube. °· You have nausea or vomiting. °· You stopped breastfeeding and have not had a menstrual period within 12 weeks of stopping. °· You are not breastfeeding and have not had a menstrual period within 12 weeks of delivery. °· You have a fever. °SEEK  IMMEDIATE MEDICAL CARE IF: °· You have persistent pain. °· You have chest pain. °· You have shortness of breath. °· You faint. °· You have leg pain. °· You have stomach pain. °· Your vaginal bleeding saturates 2 or more sanitary pads in 1 hour. °MAKE SURE YOU:  °· Understand these instructions. °· Will watch your condition. °· Will get help right away if you are not doing well or get worse. °Document Released: 07/24/2002 Document Revised: 07/04/2013 Document Reviewed: 06/28/2012 °ExitCare® Patient Information ©2014 [employer]. ° ° ° °

## 2014-01-03 ENCOUNTER — Other Ambulatory Visit: Payer: Self-pay | Admitting: Obstetrics & Gynecology

## 2014-01-03 DIAGNOSIS — I890 Lymphedema, not elsewhere classified: Secondary | ICD-10-CM

## 2014-01-03 LAB — ADAMTS13 ACTIVITY: Adamts 13 Activity: 61 % Activity — ABNORMAL LOW (ref 68–163)

## 2014-01-03 MED ORDER — HYDROCHLOROTHIAZIDE 25 MG PO TABS: 25.0000 mg | ORAL_TABLET | Freq: Every day | ORAL | Status: DC

## 2014-01-03 NOTE — Progress Notes (Signed)
Faculty Practice OB/GYN Attending Phone Call Documentation  I received a call from Michele NeerKelly Rassette, Michele Maldonado, Michele Maldonado saying that [facility] about a Lasix discharge prescription that was never received by [facility].  On review of her records, Lasix was discontinued prior to discharge on 01/02/14, patient was supposed to be discharged on HCTZ 25 mg po daily.  There was no documentation that this was given to the patient so it was e-prescribed today to the [facility] on [address].  Patient was called and told to go pick up prescription for HCTZ, also apologized for the inconvenience.    Michele Maldonado  Kanchan Gal, MD, FACOG Attending Obstetrician & Gynecologist Faculty Practice, [facility]

## 2014-01-09 ENCOUNTER — Ambulatory Visit: Payer: Medicaid Other | Admitting: Obstetrics & Gynecology

## 2014-02-11 ENCOUNTER — Encounter: Payer: Self-pay | Admitting: *Deleted

## 2014-02-11 ENCOUNTER — Ambulatory Visit: Payer: Medicaid Other | Admitting: Family Medicine

## 2014-02-11 ENCOUNTER — Telehealth: Payer: Self-pay | Admitting: *Deleted

## 2014-02-11 NOTE — Telephone Encounter (Signed)
Seren dnka appointment for follow up after delivery for proteinuria , possible nephrotic syndrome.  Called [facility] twice - only has one number- not a working number. Will send letter

## 2014-03-04 ENCOUNTER — Telehealth: Payer: Self-pay

## 2014-03-04 ENCOUNTER — Ambulatory Visit: Payer: Medicaid Other | Admitting: Obstetrics & Gynecology

## 2014-03-04 NOTE — Telephone Encounter (Signed)
Pt. Missed today's appointment with Dr. Macon LargeAnyanwu. Pt. Has missed multiple appointments to follow up after surgery. Attempted to call pt. Heard "the number you have reached is not in service." Unable to leave message. Will send letter.

## 2014-04-12 ENCOUNTER — Encounter: Payer: Self-pay | Admitting: General Practice

## 2014-07-05 ENCOUNTER — Encounter: Payer: Self-pay | Admitting: General Practice

## 2014-09-16 ENCOUNTER — Encounter (HOSPITAL_COMMUNITY): Payer: Self-pay | Admitting: Obstetrics & Gynecology

## 2014-10-23 ENCOUNTER — Encounter (HOSPITAL_COMMUNITY): Payer: Self-pay | Admitting: Obstetrics & Gynecology

## 2015-06-04 ENCOUNTER — Emergency Department (HOSPITAL_COMMUNITY)
Admission: EM | Admit: 2015-06-04 | Discharge: 2015-06-04 | Disposition: A | Discharge status: Home / Self Care | Payer: Medicaid Other | Attending: Emergency Medicine | Admitting: Emergency Medicine

## 2015-06-04 ENCOUNTER — Encounter (HOSPITAL_COMMUNITY): Payer: Self-pay

## 2015-06-04 DIAGNOSIS — Z79899 Other long term (current) drug therapy: Secondary | ICD-10-CM | POA: Insufficient documentation

## 2015-06-04 DIAGNOSIS — K0381 Cracked tooth: Secondary | ICD-10-CM | POA: Insufficient documentation

## 2015-06-04 DIAGNOSIS — Z8679 Personal history of other diseases of the circulatory system: Secondary | ICD-10-CM | POA: Insufficient documentation

## 2015-06-04 DIAGNOSIS — K0889 Other specified disorders of teeth and supporting structures: Secondary | ICD-10-CM

## 2015-06-04 DIAGNOSIS — K088 Other specified disorders of teeth and supporting structures: Secondary | ICD-10-CM | POA: Insufficient documentation

## 2015-06-04 MED ORDER — PENICILLIN V POTASSIUM 250 MG PO TABS: 250.0000 mg | ORAL_TABLET | Freq: Four times a day (QID) | ORAL | Status: AC

## 2015-06-04 NOTE — Discharge Instructions (Signed)

## 2015-06-04 NOTE — ED Notes (Signed)
Pt reports left lower molar dental pain, ongoing for months. She took an ibuprofen PTA and is no longer in pain but states it only relieves the pain for 30 minutes.

## 2015-06-04 NOTE — ED Provider Notes (Signed)
CSN: [id number]     Arrival date & time 06/04/15  2058 History  This chart was scribed for Teressa LowerVrinda Brexton Sofia, NP, working with Pricilla LovelessScott Goldston, MD by Elon SpannerGarrett Cook, ED Scribe. This patient was seen in room TR07C/TR07C and the patient's care was started at 9:18 PM.   Chief Complaint  Patient presents with  . Dental Pain   The history is provided by the patient. No language interpreter was used.   HPI Comments: Michele Maldonado is a 29 y.o. female who presents to the Emergency Department complaining of left/right lower dental pain onset several months ago with intermittent swelling.  At times there is an associated headache and right ear pain.  The pain is worse at night and she has taken ibuprofen without relief.  She is waiting to receive her medicaid card before being seen by a dentist.   Past Medical History  Diagnosis Date  . MVP (mitral valve prolapse)    Past Surgical History  Procedure Laterality Date  . Cesarean section N/A 12/29/2013    Procedure: CESAREAN SECTION;  Surgeon: Allie BossierMyra C Dove, MD;  Location: [facility];  Service: Obstetrics;  Laterality: N/A;  . Repair vaginal cuff N/A 12/30/2013    Procedure: REPAIR VAGINAL CUFF;  Surgeon: Allie BossierMyra C Dove, MD;  Location: [facility];  Service: Gynecology;  Laterality: N/A;  Insertion of Bakri Balloon   No family history on file. History  Substance Use Topics  . Smoking status: Never Smoker   . Smokeless tobacco: Not on file  . Alcohol Use: No   OB History    Gravida Para Term Preterm AB TAB SAB Ectopic Multiple Living   4 4 3 1      4      Review of Systems  HENT: Positive for dental problem.   All other systems reviewed and are negative.     Allergies  Review of patient's allergies indicates no known allergies.  Home Medications   Prior to Admission medications   Medication Sig Start Date End Date Taking? Authorizing Provider  hydrochlorothiazide (HYDRODIURIL) 25 MG tablet Take 1 tablet (25 mg total) by mouth daily. 01/03/14   Tereso NewcomerUgonna A  Anyanwu, MD  oxyCODONE-acetaminophen (PERCOCET/ROXICET) 5-325 MG per tablet Take 1-2 tablets by mouth every 4 (four) hours as needed for severe pain (moderate - severe pain). 01/02/14   Adam PhenixJames G Arnold, MD   BP 112/97 mmHg  Pulse 93  Temp(Src) 98.5 F (36.9 C) (Oral)  Resp 18  Ht 5\' 7"  (1.702 m)  Wt 137 lb 4 oz (62.256 kg)  BMI 21.49 kg/m2  SpO2 98%  LMP 06/02/2015 Physical Exam  Constitutional: She is oriented to person, place, and time. She appears well-developed and well-nourished. No distress.  HENT:  Head: Normocephalic and atraumatic.  Right Ear: External ear normal.  Left Ear: External ear normal.  Multiple decayed fractured teeth. No oral swelling noted  Eyes: Conjunctivae and EOM are normal.  Neck: Neck supple. No tracheal deviation present.  Cardiovascular: Normal rate.   Pulmonary/Chest: Effort normal. No respiratory distress.  Musculoskeletal: Normal range of motion.  Neurological: She is alert and oriented to person, place, and time.  Skin: Skin is warm and dry.  Psychiatric: She has a normal mood and affect. Her behavior is normal.  Nursing note and vitals reviewed.   ED Course  Procedures (including critical care time)  DIAGNOSTIC STUDIES: Oxygen Saturation is 98% on RA, normal by my interpretation.    COORDINATION OF CARE:  9:22 PM Discussed treatment plan with  patient at bedside.  Patient acknowledges and agrees with plan.    Labs Review Labs Reviewed - No data to display  Imaging Review No results found.   EKG Interpretation None      MDM   Final diagnoses:  Toothache    Will treat with pcn and given dental follow up  I personally performed the services described in this documentation, which was scribed in my presence. The recorded information has been reviewed and is accurate.    Teressa Lower, NP 06/04/15 2129  Pricilla Loveless, MD 06/05/15 0002

## 2016-03-03 ENCOUNTER — Encounter (HOSPITAL_COMMUNITY): Payer: Self-pay | Admitting: Emergency Medicine

## 2016-03-03 ENCOUNTER — Emergency Department (HOSPITAL_COMMUNITY)
Admission: EM | Admit: 2016-03-03 | Discharge: 2016-03-03 | Disposition: A | Discharge status: Home / Self Care | Payer: Medicaid Other | Attending: Emergency Medicine | Admitting: Emergency Medicine

## 2016-03-03 DIAGNOSIS — N63 Unspecified lump in unspecified breast: Secondary | ICD-10-CM

## 2016-03-03 DIAGNOSIS — Z3202 Encounter for pregnancy test, result negative: Secondary | ICD-10-CM | POA: Insufficient documentation

## 2016-03-03 DIAGNOSIS — Z79899 Other long term (current) drug therapy: Secondary | ICD-10-CM | POA: Insufficient documentation

## 2016-03-03 LAB — POC URINE PREG, ED: Preg Test, Ur: NEGATIVE

## 2016-03-03 NOTE — ED Notes (Signed)
Pt sts pain in right sided breast and chest; pt sts pain worse with palpation and movement

## 2016-03-03 NOTE — ED Notes (Signed)
MD states pt does not need ekg

## 2016-03-03 NOTE — ED Provider Notes (Signed)
CSN: [id number]     Arrival date & time 03/03/16  1558 History   First MD Initiated Contact with Patient 03/03/16 2011     Chief Complaint  Patient presents with  . Breast Pain  . Chest Pain     (Consider location/radiation/quality/duration/timing/severity/associated sxs/prior Treatment) HPI Patient is a 30 year old female who presents complaining of right breast pain. She says 2 days ago she had bilateral breast pain, but didn't lateralize to her right breast over the past 24 hours. She says she has noticed a lump under her right areola. There is been no fluctuance. She has had no fevers, no chills, not noticed any redness of her breast, and it had no nipple discharge. Her last menstrual period was approximately 5 days ago. She is not on any exogenous estrogen, has not had a cough, chest pain, leg swelling, or shortness of breath.   Past Medical History  Diagnosis Date  . MVP (mitral valve prolapse)    Past Surgical History  Procedure Laterality Date  . Cesarean section N/A 12/29/2013    Procedure: CESAREAN SECTION;  Surgeon: Allie BossierMyra C Dove, MD;  Location: [facility];  Service: Obstetrics;  Laterality: N/A;  . Repair vaginal cuff N/A 12/30/2013    Procedure: REPAIR VAGINAL CUFF;  Surgeon: Allie BossierMyra C Dove, MD;  Location: [facility];  Service: Gynecology;  Laterality: N/A;  Insertion of Bakri Balloon   History reviewed. No pertinent family history. Social History  Substance Use Topics  . Smoking status: Never Smoker   . Smokeless tobacco: None  . Alcohol Use: No   OB History    Gravida Para Term Preterm AB TAB SAB Ectopic Multiple Living   4 4 3 1      4      Review of Systems  Respiratory: Negative for shortness of breath.   Cardiovascular: Negative for chest pain, palpitations and leg swelling.  Gastrointestinal: Negative for abdominal distention.  Genitourinary: Negative for vaginal bleeding and vaginal discharge.  Skin:       Breast lump      Allergies  Review of patient's  allergies indicates no known allergies.  Home Medications   Prior to Admission medications   Medication Sig Start Date End Date Taking? Authorizing Provider  hydrochlorothiazide (HYDRODIURIL) 25 MG tablet Take 1 tablet (25 mg total) by mouth daily. 01/03/14  Yes Ugonna A Anyanwu, MD   BP 109/74 mmHg  Pulse 67  Temp(Src) 98.3 F (36.8 C) (Oral)  Resp 22  SpO2 98% Physical Exam  Constitutional: She is oriented to person, place, and time. She appears well-developed and well-nourished.  Eyes: Conjunctivae are normal. Pupils are equal, round, and reactive to light.  Neck: Normal range of motion. Neck supple.  Cardiovascular: Normal rate, regular rhythm and normal heart sounds.   Pulmonary/Chest: Effort normal and breath sounds normal. No respiratory distress. She has no wheezes. Right breast exhibits mass ( firm, tiny mass at the 7:00 position on her right breast under the areolac ). Left breast exhibits tenderness. Left breast exhibits no nipple discharge and no skin change. Mass: firm, tiny mass at the 7:00 position on her right breast under the areola.  Abdominal: Soft. There is no tenderness.  Musculoskeletal: Normal range of motion. She exhibits no edema.  Neurological: She is alert and oriented to person, place, and time.  Skin: Skin is warm and dry.  Nursing note and vitals reviewed.   ED Course  Procedures (including critical care time) Labs Review Labs Reviewed  POC URINE PREG, ED  Imaging Review No results found. I have personally reviewed and evaluated these images and lab results as part of my medical decision-making.   EKG Interpretation None      MDM   Final diagnoses:  Breast mass    Patient with firm mass in the right breast. No signs of abscess, no fever. No chest pain, cough or shortness of breath concerning for pulmonary etiology. Likely fibrocystic changes. Advised her to follow-up is with primary care doctor as an outpatient, for further imaging  modalities if oriented.  Erskine Emery, MD 03/04/16 0030  Alvira Monday, MD 03/05/16 1250

## 2016-03-03 NOTE — Discharge Instructions (Signed)
Fibroadenoma Fibroadenoma is a type of breast tumor that is not cancerous (is benign). These tumors are made up of breast tissue and the tissue that holds breast tissue together (connective tissue). There are several types of fibroadenomas:  Simple fibroadenoma. This is the most common type. It consists of a single type of tissue throughout the tumor.  Complex fibroadenoma. This type of tumor contains more than one kind of tissue or irregular tissue.  Juvenile fibroadenoma. This is a type of tumor that can develop in adolescent girls. It tends to grow larger over time than other adenomas. A fibroadenoma usually occurs as a single lump, but sometimes there may be more than one lump. Fibroadenomas vary in size. They can occur in one breast or in both breasts. Some fibroadenomas are too small to feel, but a larger one may feel like a firm, smooth lump that moves beneath your fingers. Although fibroadenomas are not cancer, having a fibroadenoma may slightly increase your risk for developing breast cancer in the future. CAUSES The exact cause of fibroadenoma is not known. RISK FACTORS This condition is more likely to develop in:  Women who are 20-30 years of age.  Women of African-American descent. SYMPTOMS A fibroadenoma may not cause any symptoms. These tumors usually do not cause pain unless they grow to a large size. A fibroadenoma may feel like a lump in your breast that is:  Firm.  Round.  Smooth.  Slightly moveable. DIAGNOSIS You may notice a breast lump during a breast self-exam. Your health care provider may discover it during a routine breast exam or mammogram. Your health care provider may suspect fibroadenoma if you have a breast lump that feels firm, round, and smooth and appears smooth on your mammogram. Other tests may be done to confirm the diagnosis, including:  An ultrasound to check for fluid inside the lump (cystic tumor).  A procedure that uses a needle to remove  fluid from a cystic tumor. The fluid is then checked under a microscope for cancer cells.  A mammogram to examine a lump that is not cystic (is solid).  A procedure that uses a needle to remove a sample of tissue from the lump (breast biopsy) to examine under a microscope. This test is the only method that can be used to confirm that a tumor is a fibroadenoma and is not cancer. TREATMENT Treatment for this condition may include:  Having breast exams regularly to check for changes in your fibroadenoma.  Having the fibroadenoma removed. A fibroadenoma may be removed if it is:  Large.  Continuing to grow.  Causing symptoms.  Changing the appearance of your breast.  A juvenile fibroadenoma. These tend to grow large over time. HOME CARE INSTRUCTIONS  If you had a fibroadenoma removed, follow instructions from your health care provider for care after the procedure.  Perform breast self-exams at home as told by your health care provider.  Keep all follow-up visits as told by your health care provider. This is important. SEEK MEDICAL CARE IF:  Your fibroadenoma becomes larger, feels different, or becomes painful.  You find a new breast lump.  You have any changes in the skin that covers your breast. These include:  Dimpling.  Bruising.  Thickening.  Redness.  You have any changes in your nipple.  You have fluid leaking from your nipple.   This information is not intended to replace advice given to you by your health care provider. Make sure you discuss any questions you have with   your health care provider.   Document Released: 03/18/2015 Document Reviewed: 03/18/2015 Elsevier Interactive Patient Education 2016 [employer]  

## 2016-05-15 ENCOUNTER — Inpatient Hospital Stay (HOSPITAL_COMMUNITY): Payer: Medicaid Other

## 2016-05-15 ENCOUNTER — Ambulatory Visit (HOSPITAL_COMMUNITY)
Admission: EM | Admit: 2016-05-15 | Discharge: 2016-05-15 | Discharge status: Absent without leave | Payer: Medicaid Other

## 2016-05-15 ENCOUNTER — Inpatient Hospital Stay (HOSPITAL_COMMUNITY)
Admission: AD | Admit: 2016-05-15 | Discharge: 2016-05-15 | Disposition: A | Discharge status: Home / Self Care | Payer: Medicaid Other | Source: Ambulatory Visit | Attending: Family Medicine | Admitting: Family Medicine

## 2016-05-15 ENCOUNTER — Encounter (HOSPITAL_COMMUNITY): Payer: Self-pay

## 2016-05-15 DIAGNOSIS — O4691 Antepartum hemorrhage, unspecified, first trimester: Secondary | ICD-10-CM

## 2016-05-15 DIAGNOSIS — O4690 Antepartum hemorrhage, unspecified, unspecified trimester: Secondary | ICD-10-CM

## 2016-05-15 DIAGNOSIS — R109 Unspecified abdominal pain: Secondary | ICD-10-CM | POA: Diagnosis present

## 2016-05-15 DIAGNOSIS — Z3A01 Less than 8 weeks gestation of pregnancy: Secondary | ICD-10-CM | POA: Diagnosis not present

## 2016-05-15 DIAGNOSIS — O200 Threatened abortion: Secondary | ICD-10-CM | POA: Diagnosis not present

## 2016-05-15 LAB — URINALYSIS, ROUTINE W REFLEX MICROSCOPIC
BILIRUBIN URINE: NEGATIVE
Glucose, UA: NEGATIVE mg/dL
Hgb urine dipstick: NEGATIVE
KETONES UR: NEGATIVE mg/dL
Leukocytes, UA: NEGATIVE
NITRITE: NEGATIVE
Protein, ur: NEGATIVE mg/dL
Specific Gravity, Urine: 1.01 (ref 1.005–1.030)
pH: 7 (ref 5.0–8.0)

## 2016-05-15 LAB — POCT PREGNANCY, URINE: PREG TEST UR: POSITIVE — AB

## 2016-05-15 NOTE — MAU Note (Signed)
Pt c/o abdominal cramping for the last week. Pt states she felt pain and tightness in her upper left side of her chest starting yesterday. Pt states she has been feeling pain in her legs and frequent charlie horses for the last week. Pt states her hands have been feeling tingly for the last week. Pt had some spotting yesterday one time when she wiped.

## 2016-05-15 NOTE — MAU Provider Note (Signed)
  History   [id number] in with intermittant chest pressure and tightness, also abd cramping and spotting states apx 4.[redacted] wks gestation. Hx of pre eclampsia, cesarean section and post partum hemorrhage with last preg.  CSN: [id number]  Arrival date and time: 05/15/16 1317   First Provider Initiated Contact with Patient 05/15/16 1358      Chief Complaint  Patient presents with  . Abdominal Cramping  . Chest Pain  . Joint Pain   HPI  OB History    Gravida Para Term Preterm AB TAB SAB Ectopic Multiple Living   5 4 3 1      4       Past Medical History  Diagnosis Date  . MVP (mitral valve prolapse)     Past Surgical History  Procedure Laterality Date  . Cesarean section N/A 12/29/2013    Procedure: CESAREAN SECTION;  Surgeon: Allie BossierMyra C Dove, MD;  Location: [facility];  Service: Obstetrics;  Laterality: N/A;  . Repair vaginal cuff N/A 12/30/2013    Procedure: REPAIR VAGINAL CUFF;  Surgeon: Allie BossierMyra C Dove, MD;  Location: [facility];  Service: Gynecology;  Laterality: N/A;  Insertion of Bakri Balloon    Family History  Problem Relation Age of Onset  . Hypertension Maternal Grandmother   . Diabetes Maternal Grandmother     Social History  Substance Use Topics  . Smoking status: Never Smoker   . Smokeless tobacco: Never Used  . Alcohol Use: No    Allergies: No Known Allergies  Prescriptions prior to admission  Medication Sig Dispense Refill Last Dose  . hydrochlorothiazide (HYDRODIURIL) 25 MG tablet Take 1 tablet (25 mg total) by mouth daily. 30 tablet 3 03/03/2016 at Unknown time    Review of Systems  Constitutional: Negative.   HENT: Negative.   Eyes: Negative.   Respiratory: Negative.   Cardiovascular:       Chest pressure and tightness  Gastrointestinal: Positive for abdominal pain.  Genitourinary: Negative.   Musculoskeletal: Negative.   Skin: Negative.   Neurological: Negative.   Endo/Heme/Allergies: Negative.   Psychiatric/Behavioral: Negative.    Physical Exam   Blood  pressure 112/70, pulse 72, temperature 99.6 F (37.6 C), resp. rate 17, height 5\' 7"  (1.702 m), weight 141 lb (63.957 kg), last menstrual period 04/12/2016, SpO2 100 %, unknown if currently breastfeeding.  Physical Exam  Constitutional: She is oriented to person, place, and time. She appears well-developed and well-nourished.  HENT:  Head: Normocephalic.  Eyes: Pupils are equal, round, and reactive to light.  Neck: Normal range of motion.  Cardiovascular: Normal rate, regular rhythm, normal heart sounds and intact distal pulses.   Respiratory: Effort normal and breath sounds normal.  GI: Soft. Bowel sounds are normal.  Musculoskeletal: Normal range of motion.  Neurological: She is alert and oriented to person, place, and time. She has normal reflexes.  Skin: Skin is warm and dry.  Psychiatric: She has a normal mood and affect. Her behavior is normal. Judgment and thought content normal.    MAU Course  Procedures  MDM Threat ab, High risk preg  Assessment and Plan  Threat ab Plan: EKG, u/s to confirm IUP and viability EKG reviewed by Dr. Adrian BlackwaterStinson. U/s shows probable early preg. Will d/c home will send message to clinic to make pt appt.  Wyvonnia DuskyMarie Taje Tondreau 05/15/2016, 2:03 PM

## 2016-05-15 NOTE — Discharge Instructions (Signed)

## 2016-05-19 ENCOUNTER — Telehealth: Payer: Self-pay | Admitting: General Practice

## 2016-05-19 DIAGNOSIS — Z3490 Encounter for supervision of normal pregnancy, unspecified, unspecified trimester: Secondary | ICD-10-CM

## 2016-05-19 NOTE — Telephone Encounter (Signed)
Opened in error

## 2016-05-25 ENCOUNTER — Ambulatory Visit: Payer: Medicaid Other | Admitting: Advanced Practice Midwife

## 2016-05-25 ENCOUNTER — Ambulatory Visit (INDEPENDENT_AMBULATORY_CARE_PROVIDER_SITE_OTHER): Payer: Medicaid Other | Admitting: Advanced Practice Midwife

## 2016-05-25 ENCOUNTER — Ambulatory Visit (HOSPITAL_COMMUNITY)
Admission: RE | Admit: 2016-05-25 | Discharge: 2016-05-25 | Disposition: A | Discharge status: Home / Self Care | Payer: Medicaid Other | Source: Ambulatory Visit | Attending: Obstetrics and Gynecology | Admitting: Obstetrics and Gynecology

## 2016-05-25 DIAGNOSIS — O209 Hemorrhage in early pregnancy, unspecified: Secondary | ICD-10-CM | POA: Insufficient documentation

## 2016-05-25 DIAGNOSIS — Z3491 Encounter for supervision of normal pregnancy, unspecified, first trimester: Secondary | ICD-10-CM

## 2016-05-25 DIAGNOSIS — O4690 Antepartum hemorrhage, unspecified, unspecified trimester: Secondary | ICD-10-CM

## 2016-05-25 DIAGNOSIS — Z3A01 Less than 8 weeks gestation of pregnancy: Secondary | ICD-10-CM | POA: Insufficient documentation

## 2016-05-25 DIAGNOSIS — Z3490 Encounter for supervision of normal pregnancy, unspecified, unspecified trimester: Secondary | ICD-10-CM | POA: Insufficient documentation

## 2016-05-25 NOTE — Progress Notes (Signed)
Ultrasounds Results Note  SUBJECTIVE HPI:  Michele Maldonado is a 30 y.o. [phone] at [redacted]w[redacted]d by LMP who presents to the [facility] for followup ultrasound results. The patient denies abdominal pain or vaginal bleeding.  Upon review of the patient's records, patient was first seen in MAU on 7/1 for vaginal bleeding and had early likely IUP on [place].  Repeat ultrasound was performed earlier today.   Past Medical History  Diagnosis Date  . MVP (mitral valve prolapse)    Past Surgical History  Procedure Laterality Date  . Cesarean section N/A 12/29/2013    Procedure: CESAREAN SECTION;  Surgeon: Allie Bossier, MD;  Location: [facility];  Service: Obstetrics;  Laterality: N/A;  . Repair vaginal cuff N/A 12/30/2013    Procedure: REPAIR VAGINAL CUFF;  Surgeon: Allie Bossier, MD;  Location: [facility];  Service: Gynecology;  Laterality: N/A;  Insertion of Bakri Balloon   Social History   Social History  . Marital Status: Single    Spouse Name: N/A  . Number of Children: N/A  . Years of Education: N/A   Occupational History  . Not on file.   Social History Main Topics  . Smoking status: Never Smoker   . Smokeless tobacco: Never Used  . Alcohol Use: No  . Drug Use: No  . Sexual Activity: Yes   Other Topics Concern  . Not on file   Social History Narrative   Current Outpatient Prescriptions on File Prior to Visit  Medication Sig Dispense Refill  . Prenatal Vit-Fe Fumarate-FA (PRENATAL MULTIVITAMIN) TABS tablet Take 1 tablet by mouth daily.     No current facility-administered medications on file prior to visit.   No Known Allergies  I have reviewed patient's Past Medical Hx, Surgical Hx, Family Hx, Social Hx, medications and allergies.   Review of Systems Review of Systems  Constitutional: Negative for fever and chills.  Gastrointestinal: Negative for nausea, vomiting, abdominal pain, diarrhea and constipation.  Genitourinary: Negative for dysuria.  Musculoskeletal: Negative  for back pain.  Neurological: Negative for dizziness and weakness.    Physical Exam  LMP 04/12/2016 (Exact Date)  GENERAL: Well-developed, well-nourished female in no acute distress.  HEENT: Normocephalic, atraumatic.   LUNGS: Effort normal ABDOMEN: soft, non-tender HEART: Regular rate  SKIN: Warm, dry and without erythema PSYCH: Normal mood and affect NEURO: Alert and oriented x 4  LAB RESULTS No results found for this or any previous visit (from the past 24 hour(s)).  IMAGING US Ob Comp Less 14 Wks  05/15/2016  CLINICAL DATA:  Spotting for 1 day. Estimated gestational age per LMP 4 weeks 5 days. Quantitative beta HCG not ordered. EXAM: OBSTETRIC <14 WK [place] AND TRANSVAGINAL OB US TECHNIQUE: Both transabdominal and transvaginal ultrasound examinations were performed for complete evaluation of the gestation as well as the maternal uterus, adnexal regions, and pelvic cul-de-sac. Transvaginal technique was performed to assess early pregnancy. COMPARISON:  None. FINDINGS: Intrauterine gestational sac: Single visualized. Yolk sac:  Not visualized. Embryo:  Not visualized. Cardiac Activity: Not visualized. Heart Rate: Not visualized. MSD: 4.8  mm   5 w   2  d Subchorionic hemorrhage:  None visualized. Maternal uterus/adnexae: Ovaries are normal in size, shape and position. No significant free pelvic fluid. IMPRESSION: Probable normal early IUP. Mean sac diameter compatible with estimated gestational age [redacted] weeks 2 days. Recommend correlation with serial quantitative beta HCG and follow-up ultrasound 2 weeks. Electronically Signed   By: Elberta Fortis M.D.   On:  05/15/2016 15:34   [place] Ob Transvaginal  05/25/2016  CLINICAL DATA:  Bleeding during first trimester pregnancy EXAM: OBSTETRIC <14 WK ULTRASOUND TECHNIQUE: Transabdominal ultrasound was performed for evaluation of the gestation as well as the maternal uterus and adnexal regions. COMPARISON:  Ob ultrasound of May 15, 2016 FINDINGS: Intrauterine  gestational sac: Present Yolk sac:  Present Embryo:  Present Cardiac Activity: Present Heart Rate: 121 bpm CRL:   4.9  mm   6 w 1 d                  US EDC: January 17, 2017 Subchorionic hemorrhage:  None visualized. Maternal uterus/adnexae: The maternal ovaries are normal in echotexture and vascularity. IMPRESSION: 1. Viable IUP with estimated gestational age of [redacted] weeks 1 day. 2. No subchorionic hemorrhage is observed. The maternal adnexal regions are unremarkable. 3. Follow-up ultrasound is recommended if vaginal bleeding persists. Electronically Signed   By: David  SwazilandJordan M.D.   On: 05/25/2016 10:44   [place] Ob Transvaginal  05/15/2016  CLINICAL DATA:  Spotting for 1 day. Estimated gestational age per LMP 4 weeks 5 days. Quantitative beta HCG not ordered. EXAM: OBSTETRIC <14 WK US AND TRANSVAGINAL OB US TECHNIQUE: Both transabdominal and transvaginal ultrasound examinations were performed for complete evaluation of the gestation as well as the maternal uterus, adnexal regions, and pelvic cul-de-sac. Transvaginal technique was performed to assess early pregnancy. COMPARISON:  None. FINDINGS: Intrauterine gestational sac: Single visualized. Yolk sac:  Not visualized. Embryo:  Not visualized. Cardiac Activity: Not visualized. Heart Rate: Not visualized. MSD: 4.8  mm   5 w   2  d Subchorionic hemorrhage:  None visualized. Maternal uterus/adnexae: Ovaries are normal in size, shape and position. No significant free pelvic fluid. IMPRESSION: Probable normal early IUP. Mean sac diameter compatible with estimated gestational age [redacted] weeks 2 days. Recommend correlation with serial quantitative beta HCG and follow-up ultrasound 2 weeks. Electronically Signed   By: Elberta Fortisaniel  Boyle M.D.   On: 05/15/2016 15:34    ASSESSMENT 1. Normal IUP (intrauterine pregnancy) on prenatal ultrasound, first trimester     PLAN Discharge home in stable condition Patient advised to start/continue taking prenatal vitamins  Patient advised to  start prenatal care , message sent to [facility] to establish care  Hurshel PartyLisa A Leftwich-Kirby, CNM  05/25/2016  12:13 PM

## 2016-05-31 ENCOUNTER — Emergency Department (HOSPITAL_COMMUNITY)
Admission: EM | Admit: 2016-05-31 | Discharge: 2016-05-31 | Disposition: A | Discharge status: Home / Self Care | Payer: Medicaid Other | Attending: Emergency Medicine | Admitting: Emergency Medicine

## 2016-05-31 ENCOUNTER — Encounter (HOSPITAL_COMMUNITY): Payer: Self-pay | Admitting: Oncology

## 2016-05-31 DIAGNOSIS — Z113 Encounter for screening for infections with a predominantly sexual mode of transmission: Secondary | ICD-10-CM | POA: Insufficient documentation

## 2016-05-31 DIAGNOSIS — O26891 Other specified pregnancy related conditions, first trimester: Secondary | ICD-10-CM | POA: Diagnosis present

## 2016-05-31 DIAGNOSIS — Z3A01 Less than 8 weeks gestation of pregnancy: Secondary | ICD-10-CM | POA: Insufficient documentation

## 2016-05-31 LAB — GC/CHLAMYDIA PROBE AMP (~~LOC~~) NOT AT ARMC
CHLAMYDIA, DNA PROBE: NEGATIVE
NEISSERIA GONORRHEA: NEGATIVE

## 2016-05-31 NOTE — ED Provider Notes (Signed)
CSN: [id number]     Arrival date & time 05/31/16  0141 History   First MD Initiated Contact with Patient 05/31/16 [phone]     Chief Complaint  Patient presents with  . Exposure to STD    HPI  Michele Maldonado is an 30 y.o. female G6P4 currently [redacted] weeks pregnant who presents to the ED for STI screening. She states her partner has been having "tingling" in his genital area and she is here to be checked. She denies any symptoms currently. Denies vaginal discharge or bleeding. Denies abdominal pain, nausea, vomiting, fever, chills. Denies dysuria, increased urinary frequency/urgency.    Past Medical History  Diagnosis Date  . MVP (mitral valve prolapse)    Past Surgical History  Procedure Laterality Date  . Cesarean section N/A 12/29/2013    Procedure: CESAREAN SECTION;  Surgeon: Allie BossierMyra C Dove, MD;  Location: [facility];  Service: Obstetrics;  Laterality: N/A;  . Repair vaginal cuff N/A 12/30/2013    Procedure: REPAIR VAGINAL CUFF;  Surgeon: Allie BossierMyra C Dove, MD;  Location: [facility];  Service: Gynecology;  Laterality: N/A;  Insertion of Bakri Balloon   Family History  Problem Relation Age of Onset  . Hypertension Maternal Grandmother   . Diabetes Maternal Grandmother    Social History  Substance Use Topics  . Smoking status: Never Smoker   . Smokeless tobacco: Never Used  . Alcohol Use: No   OB History    Gravida Para Term Preterm AB TAB SAB Ectopic Multiple Living   5 4 3 1      4      Review of Systems  All other systems reviewed and are negative.     Allergies  Review of patient's allergies indicates no known allergies.  Home Medications   Prior to Admission medications   Medication Sig Start Date End Date Taking? Authorizing Provider  Prenatal Vit-Fe Fumarate-FA (PRENATAL MULTIVITAMIN) TABS tablet Take 1 tablet by mouth daily.    Historical Provider, MD   BP 107/68 mmHg  Pulse 73  Temp(Src) 97.8 F (36.6 C) (Oral)  Resp 16  SpO2 100%  LMP 04/12/2016 (Exact Date)   Breastfeeding? No Physical Exam  Constitutional: She is oriented to person, place, and time. No distress.  HENT:  Head: Atraumatic.  Right Ear: External ear normal.  Left Ear: External ear normal.  Nose: Nose normal.  Eyes: Conjunctivae are normal. No scleral icterus.  Cardiovascular: Normal rate and regular rhythm.   Pulmonary/Chest: Effort normal. No respiratory distress.  Abdominal: Soft. She exhibits no distension. There is no tenderness.  Genitourinary:  deferred  Neurological: She is alert and oriented to person, place, and time.  Skin: Skin is warm and dry. She is not diaphoretic.  Psychiatric: She has a normal mood and affect. Her behavior is normal.  Nursing note and vitals reviewed.   ED Course  Procedures (including critical care time) Labs Review Labs Reviewed  HIV ANTIBODY (ROUTINE TESTING)  RPR  GC/CHLAMYDIA PROBE AMP ([place]) NOT AT [facility]    Imaging Review No results found. I have personally reviewed and evaluated these images and lab results as part of my medical decision-making.   EKG Interpretation None      MDM   Final diagnoses:  Encounter for screening examination for sexually transmitted disease    Pt's SO recently with tingling in his genitals. Was treated empirically on 7/12 though GC/chlamydia returned negative. Pt here tonight for STI screening. She is completely asymptomatic. Will send GC, chlamydia, HIV, RPR. No  tx at this time. She has OB follow up coming up. ER return precautions given.     Carlene Coria, PA-C 05/31/16 1610  Shon Baton, MD 05/31/16 2255

## 2016-05-31 NOTE — Discharge Instructions (Signed)
We will call you if any of your tests come back positive. Please follow up with your OB/GYN as scheduled. Call [facility] if you need to establish a new OB. Return to the ER for new or worsening symptoms.

## 2016-05-31 NOTE — ED Notes (Signed)
Pt is [redacted] weeks pregnant.  Tonight her significant other, who is also being seen here tonight, told her his penis was "tingling"  Pt denies having any sx at this time.  However would like to be checked out.  Pt denies any unprotected sex w/ another person besides significant other.

## 2016-05-31 NOTE — ED Notes (Signed)
Pt reports that she wanted to be evaluated because her significant other began having tingling in his penis. Pt denies any symptoms at this time.

## 2016-05-31 NOTE — ED Notes (Signed)
Pt reports understanding of discharge information. No questions at time of discharge 

## 2016-06-01 LAB — RPR: RPR Ser Ql: NONREACTIVE

## 2016-06-01 LAB — HIV ANTIBODY (ROUTINE TESTING W REFLEX): HIV SCREEN 4TH GENERATION: NONREACTIVE

## 2016-06-02 ENCOUNTER — Encounter (HOSPITAL_COMMUNITY): Payer: Self-pay

## 2016-06-02 ENCOUNTER — Inpatient Hospital Stay (HOSPITAL_COMMUNITY)
Admission: AD | Admit: 2016-06-02 | Discharge: 2016-06-02 | Disposition: A | Discharge status: Home / Self Care | Payer: Medicaid Other | Source: Ambulatory Visit | Attending: Obstetrics & Gynecology | Admitting: Obstetrics & Gynecology

## 2016-06-02 DIAGNOSIS — O210 Mild hyperemesis gravidarum: Secondary | ICD-10-CM | POA: Insufficient documentation

## 2016-06-02 DIAGNOSIS — R110 Nausea: Secondary | ICD-10-CM | POA: Diagnosis present

## 2016-06-02 DIAGNOSIS — Z3A01 Less than 8 weeks gestation of pregnancy: Secondary | ICD-10-CM | POA: Diagnosis not present

## 2016-06-02 DIAGNOSIS — O2691 Pregnancy related conditions, unspecified, first trimester: Secondary | ICD-10-CM | POA: Diagnosis present

## 2016-06-02 LAB — URINE MICROSCOPIC-ADD ON

## 2016-06-02 LAB — URINALYSIS, ROUTINE W REFLEX MICROSCOPIC
BILIRUBIN URINE: NEGATIVE
Glucose, UA: NEGATIVE mg/dL
HGB URINE DIPSTICK: NEGATIVE
KETONES UR: NEGATIVE mg/dL
Nitrite: NEGATIVE
Protein, ur: NEGATIVE mg/dL
Specific Gravity, Urine: 1.02 (ref 1.005–1.030)
pH: 6 (ref 5.0–8.0)

## 2016-06-02 MED ORDER — PROMETHAZINE HCL 25 MG PO TABS
25.0000 mg | ORAL_TABLET | Freq: Four times a day (QID) | ORAL | Status: DC | PRN
Start: 2016-06-02 — End: 2016-07-20

## 2016-06-02 MED ORDER — PROMETHAZINE HCL 25 MG PO TABS: 25.0000 mg | ORAL_TABLET | Freq: Four times a day (QID) | ORAL | Status: DC | PRN

## 2016-06-02 MED ORDER — MECLIZINE HCL 25 MG PO TABS: 25.0000 mg | ORAL_TABLET | Freq: Three times a day (TID) | ORAL | Status: DC | PRN

## 2016-06-02 MED ORDER — MECLIZINE HCL 25 MG PO TABS
25.0000 mg | ORAL_TABLET | Freq: Once | ORAL | Status: AC
  Administered 2016-06-02: 25 mg via ORAL
  Filled 2016-06-02: qty 1

## 2016-06-02 NOTE — MAU Note (Signed)
Pt reports cramping in her lower abd and lower back since last pm. States she is nauseated all the time.

## 2016-06-02 NOTE — MAU Provider Note (Signed)
History     CSN: [id number]  Arrival date and time: 06/02/16 1919   First Provider Initiated Contact with Patient 06/02/16 2002      No chief complaint on file.  HPI Pt is 32 y.E.[id number] at [redacted]w[redacted]d pregnant who presents with nauseated and decrease appetite.  Pt sees [facility] for prenatal care- states she has not had  Anything for nausea.  Pt is also having some lower abdominal cramping- no bleeding. Pt has confirmed viable IUP with ultrasound 05/25/2016 at [redacted]w[redacted]d Pt was seen at [facility] ED for STD screening- ("partner had tingling in his genitalia")- asymptomatic with neg GC/Chlamydia , HIV and RPR labs RN note: Pt reports cramping in her lower abd and lower back since last pm. States she is nauseated all the time.            Past Medical History  Diagnosis Date  . MVP (mitral valve prolapse)     Past Surgical History  Procedure Laterality Date  . Cesarean section N/A 12/29/2013    Procedure: CESAREAN SECTION;  Surgeon: Allie Bossier, MD;  Location: [facility];  Service: Obstetrics;  Laterality: N/A;  . Repair vaginal cuff N/A 12/30/2013    Procedure: REPAIR VAGINAL CUFF;  Surgeon: Allie Bossier, MD;  Location: [facility];  Service: Gynecology;  Laterality: N/A;  Insertion of Bakri Balloon    Family History  Problem Relation Age of Onset  . Hypertension Maternal Grandmother   . Diabetes Maternal Grandmother     Social History  Substance Use Topics  . Smoking status: Never Smoker   . Smokeless tobacco: Never Used  . Alcohol Use: No    Allergies: No Known Allergies  Prescriptions prior to admission  Medication Sig Dispense Refill Last Dose  . Prenatal Vit-Fe Fumarate-FA (PRENATAL MULTIVITAMIN) TABS tablet Take 1 tablet by mouth daily.   06/02/2016 at Unknown time    Review of Systems  Constitutional: Negative for fever and chills.  Gastrointestinal: Positive for nausea, vomiting and abdominal pain. Negative for diarrhea and constipation.  Genitourinary: Negative for dysuria.   Physical  Exam   Blood pressure 104/61, pulse 73, temperature 98.7 F (37.1 C), temperature source Oral, resp. rate 15, height  (1.727 m), weight 140 lb (63.504 kg), last menstrual period 04/12/2016, SpO2 100 %.  Physical Exam  Nursing note and vitals reviewed. Constitutional: She is oriented to person, place, and time. She appears well-developed and well-nourished. No distress.  HENT:  Head: Normocephalic.  Eyes: Pupils are equal, round, and reactive to light.  Neck: Normal range of motion. Neck supple.  Cardiovascular: Normal rate.   Respiratory: Effort normal.  GI: Soft.  Musculoskeletal: Normal range of motion.  Neurological: She is alert and oriented to person, place, and time.  Skin: Skin is warm and dry.  Psychiatric: She has a normal mood and affect.    MAU Course  Procedures Results for orders placed or performed during the hospital encounter of 06/02/16 (from the past 24 hour(s))  Urinalysis, Routine w reflex microscopic (not at [facility])     Status: Abnormal   Collection Time: 06/02/16  7:44 PM  Result Value Ref Range   Color, Urine YELLOW YELLOW   APPearance CLEAR CLEAR   Specific Gravity, Urine 1.020 1.005 - 1.030   pH 6.0 5.0 - 8.0   Glucose, UA NEGATIVE NEGATIVE mg/dL   Hgb urine dipstick NEGATIVE NEGATIVE   Bilirubin Urine NEGATIVE NEGATIVE   Ketones, ur NEGATIVE NEGATIVE mg/dL   Protein, ur NEGATIVE NEGATIVE mg/dL  Nitrite NEGATIVE NEGATIVE   Leukocytes, UA SMALL (A) NEGATIVE  Urine microscopic-add on     Status: Abnormal   Collection Time: 06/02/16  7:44 PM  Result Value Ref Range   Squamous Epithelial / LPF 0-5 (A) NONE SEEN   WBC, UA 0-5 0 - 5 WBC/hpf   RBC / HPF 0-5 0 - 5 RBC/hpf   Bacteria, UA RARE (A) NONE SEEN  Antivert 25mg  PO given- pt felt relief from nausea Discussed small frequent meals    Assessment and Plan  Morning sickness in 1st trimester- Rx Antivert 25mg  and Phenergan 25mg  Diet for hyperemesis F/u with [facility] for OB appointment- return  sooner for any concerns or worsening sx  LINEBERRY,SUSAN 06/02/2016, 8:03 PM

## 2016-06-02 NOTE — Discharge Instructions (Signed)
Eating Plan for Hyperemesis Gravidarum °Severe cases of hyperemesis gravidarum can lead to dehydration and malnutrition. The hyperemesis eating plan is one way to lessen the symptoms of nausea and vomiting. It is often used with prescribed medicines to control your symptoms.  °WHAT CAN I DO TO RELIEVE MY SYMPTOMS? °Listen to your body. Everyone is different and has different preferences. Find what works best for you. Some of the following things may help: °· Eat and drink slowly. °· Eat 5-6 small meals daily instead of 3 large meals.   °· Eat crackers before you get out of bed in the morning.   °· Starchy foods are usually well tolerated (such as cereal, toast, bread, potatoes, pasta, rice, and pretzels).   °· Ginger may help with nausea. Add ¼ tsp ground ginger to hot tea or choose ginger tea.   °· Try drinking 100% fruit juice or an electrolyte drink. °· Continue to take your prenatal vitamins as directed by your health care provider. If you are having trouble taking your prenatal vitamins, talk with your health care provider about different options. °· Include at least 1 serving of protein with your meals and snacks (such as meats or poultry, beans, nuts, eggs, or yogurt). Try eating a protein-rich snack before bed (such as cheese and crackers or a half turkey or peanut butter sandwich). °WHAT THINGS SHOULD I AVOID TO REDUCE MY SYMPTOMS? °The following things may help reduce your symptoms: °· Avoid foods with strong smells. Try eating meals in well-ventilated areas that are free of odors. °· Avoid drinking water or other beverages with meals. Try not to drink anything less than 30 minutes before and after meals. °· Avoid drinking more than 1 cup of fluid at a time. °· Avoid fried or high-fat foods, such as butter and cream sauces. °· Avoid spicy foods. °· Avoid skipping meals the best you can. Nausea can be more intense on an empty stomach. If you cannot tolerate food at that time, do not force it. Try sucking on  ice chips or other frozen items and make up the calories later. °· Avoid lying down within 2 hours after eating. °  °This information is not intended to replace advice given to you by your health care provider. Make sure you discuss any questions you have with your health care provider. °  °Document Released: 08/29/2007 Document Revised: 11/06/2013 Document Reviewed: 09/05/2013 °Elsevier Interactive Patient Education ©2016 Elsevier Inc. ° ° °Safe Medications in Pregnancy  ° °Acne:  °Benzoyl Peroxide  °Salicylic Acid  ° °Backache/Headache:  °Tylenol: 2 regular strength every 4 hours OR  °             2 Extra strength every 6 hours  ° °Colds/Coughs/Allergies:  °Benadryl (alcohol free) 25 mg every 6 hours as needed  °Breath right strips  °Claritin  °Cepacol throat lozenges  °Chloraseptic throat spray  °Cold-Eeze- up to three times per day  °Cough drops, alcohol free  °Flonase (by prescription only)  °Guaifenesin  °Mucinex  °Robitussin DM (plain only, alcohol free)  °Saline nasal spray/drops  °Sudafed (pseudoephedrine) & Actifed * use only after [redacted] weeks gestation and if you do not have high blood pressure  °Tylenol  °Vicks Vaporub  °Zinc lozenges  °Zyrtec  ° °Constipation:  °Colace  °Ducolax suppositories  °Fleet enema  °Glycerin suppositories  °Metamucil  °Milk of magnesia  °Miralax  °Senokot  °Smooth move tea  ° °Diarrhea:  °Kaopectate  °Imodium A-D  ° °*NO pepto Bismol  ° °Hemorrhoids:  °Anusol  °Anusol HC  °Preparation H  °Tucks  ° °  Indigestion:  °Tums  °Maalox  °Mylanta  °Zantac  °Pepcid  ° °Insomnia:  °Benadryl (alcohol free) 25mg every 6 hours as needed  °Tylenol PM  °Unisom, no Gelcaps  ° °Leg Cramps:  °Tums  °MagGel  ° °Nausea/Vomiting:  °Bonine  °Dramamine  °Emetrol  °Ginger extract  °Sea bands  °Meclizine  °Nausea medication to take during pregnancy:  °Unisom (doxylamine succinate 25 mg tablets) Take one tablet daily at bedtime. If symptoms are not adequately controlled, the dose can be increased to a  maximum recommended dose of two tablets daily (1/2 tablet in the morning, 1/2 tablet mid-afternoon and one at bedtime).  °Vitamin B6 100mg tablets. Take one tablet twice a day (up to 200 mg per day).  ° °Skin Rashes:  °Aveeno products  °Benadryl cream or 25mg every 6 hours as needed  °Calamine Lotion  °1% cortisone cream  ° °Yeast infection:  °Gyne-lotrimin 7  °Monistat 7  ° ° °**If taking multiple medications, please check labels to avoid duplicating the same active ingredients  °**take medication as directed on the label  °** Do not exceed 4000 mg of tylenol in 24 hours  °**Do not take medications that contain aspirin or ibuprofen  °    °   ° ° °

## 2016-06-28 ENCOUNTER — Encounter: Payer: Self-pay | Admitting: Obstetrics & Gynecology

## 2016-07-01 ENCOUNTER — Encounter: Payer: Medicaid Other | Admitting: Obstetrics & Gynecology

## 2016-07-11 ENCOUNTER — Encounter (HOSPITAL_COMMUNITY): Payer: Self-pay

## 2016-07-11 ENCOUNTER — Inpatient Hospital Stay (HOSPITAL_COMMUNITY)
Admission: AD | Admit: 2016-07-11 | Discharge: 2016-07-11 | Disposition: A | Discharge status: Home / Self Care | Payer: Medicaid Other | Source: Ambulatory Visit | Attending: Obstetrics & Gynecology | Admitting: Obstetrics & Gynecology

## 2016-07-11 DIAGNOSIS — O9989 Other specified diseases and conditions complicating pregnancy, childbirth and the puerperium: Secondary | ICD-10-CM | POA: Diagnosis not present

## 2016-07-11 DIAGNOSIS — O99411 Diseases of the circulatory system complicating pregnancy, first trimester: Secondary | ICD-10-CM | POA: Insufficient documentation

## 2016-07-11 DIAGNOSIS — Z3A12 12 weeks gestation of pregnancy: Secondary | ICD-10-CM | POA: Insufficient documentation

## 2016-07-11 DIAGNOSIS — R109 Unspecified abdominal pain: Secondary | ICD-10-CM

## 2016-07-11 DIAGNOSIS — R432 Parageusia: Secondary | ICD-10-CM | POA: Diagnosis not present

## 2016-07-11 DIAGNOSIS — O23591 Infection of other part of genital tract in pregnancy, first trimester: Secondary | ICD-10-CM | POA: Diagnosis not present

## 2016-07-11 DIAGNOSIS — O26891 Other specified pregnancy related conditions, first trimester: Secondary | ICD-10-CM | POA: Insufficient documentation

## 2016-07-11 DIAGNOSIS — I341 Nonrheumatic mitral (valve) prolapse: Secondary | ICD-10-CM | POA: Insufficient documentation

## 2016-07-11 DIAGNOSIS — B9689 Other specified bacterial agents as the cause of diseases classified elsewhere: Secondary | ICD-10-CM

## 2016-07-11 DIAGNOSIS — N760 Acute vaginitis: Secondary | ICD-10-CM | POA: Insufficient documentation

## 2016-07-11 DIAGNOSIS — O26899 Other specified pregnancy related conditions, unspecified trimester: Secondary | ICD-10-CM

## 2016-07-11 LAB — URINALYSIS, ROUTINE W REFLEX MICROSCOPIC
Bilirubin Urine: NEGATIVE
Glucose, UA: NEGATIVE mg/dL
Hgb urine dipstick: NEGATIVE
KETONES UR: NEGATIVE mg/dL
LEUKOCYTES UA: NEGATIVE
NITRITE: NEGATIVE
PH: 7 (ref 5.0–8.0)
PROTEIN: NEGATIVE mg/dL
Specific Gravity, Urine: 1.015 (ref 1.005–1.030)

## 2016-07-11 LAB — WET PREP, GENITAL
TRICH WET PREP: NONE SEEN
Yeast Wet Prep HPF POC: NONE SEEN

## 2016-07-11 MED ORDER — METRONIDAZOLE 500 MG PO TABS: 500.0000 mg | ORAL_TABLET | Freq: Two times a day (BID) | ORAL | 0 refills | Status: DC

## 2016-07-11 MED ORDER — RANITIDINE HCL 150 MG PO TABS: 150.0000 mg | ORAL_TABLET | Freq: Two times a day (BID) | ORAL | 0 refills | Status: DC

## 2016-07-11 NOTE — MAU Provider Note (Signed)
History     CSN: [id number]  Arrival date and time: 07/11/16 0910   First Provider Initiated Contact with Patient 07/11/16 [phone]      Chief Complaint  Patient presents with  . Abdominal Pain   HPI Michele Maldonado is a 30 y.o. [id number] at [redacted]w[redacted]d who presents with abdominal pain. Symptoms began 1 week ago. Reports lower abdominal pain that is sharp and sore. Pain is intermittent. Occurs several times a day with certain movements. Currently rates pain 6/10. Has not treated. Pain aggravated by twisting motions. Vaginal discharge x 1+ week. Describes as clear/white & thin. Denies odor or irritation.  Denies diarrhea, constipation, vaginal bleeding, fever/chills, or dysuria.  Some continued heartburn, metallic taste, & nausea. Has antiemetic at home that is helping with symptoms.   OB History    Gravida Para Term Preterm AB Living   5 4 3 1   4    SAB TAB Ectopic Multiple Live Births           4      Past Medical History:  Diagnosis Date  . MVP (mitral valve prolapse)     Past Surgical History:  Procedure Laterality Date  . CESAREAN SECTION N/A 12/29/2013   Procedure: CESAREAN SECTION;  Surgeon: Allie Bossier, MD;  Location: [facility];  Service: Obstetrics;  Laterality: N/A;  . REPAIR VAGINAL CUFF N/A 12/30/2013   Procedure: REPAIR VAGINAL CUFF;  Surgeon: Allie Bossier, MD;  Location: [facility];  Service: Gynecology;  Laterality: N/A;  Insertion of Bakri Balloon    Family History  Problem Relation Age of Onset  . Hypertension Maternal Grandmother   . Diabetes Maternal Grandmother     Social History  Substance Use Topics  . Smoking status: Never Smoker  . Smokeless tobacco: Never Used  . Alcohol use No    Allergies: No Known Allergies  Prescriptions Prior to Admission  Medication Sig Dispense Refill Last Dose  . meclizine (ANTIVERT) 25 MG tablet Take 1 tablet (25 mg total) by mouth 3 (three) times daily as needed for nausea. 30 tablet 0   . Prenatal Vit-Fe Fumarate-FA (PRENATAL  MULTIVITAMIN) TABS tablet Take 1 tablet by mouth daily.   06/02/2016 at Unknown time  . promethazine (PHENERGAN) 25 MG tablet Take 1 tablet (25 mg total) by mouth every 6 (six) hours as needed for nausea or vomiting. 30 tablet 0     Review of Systems  Constitutional: Negative for chills and fever.  Gastrointestinal: Positive for abdominal pain, heartburn and nausea. Negative for blood in stool, constipation, diarrhea and vomiting.  Genitourinary: Negative for dysuria.       + vaginal discharge No vaginal bleeding   Physical Exam   Blood pressure (!) 109/48, pulse 81, temperature 98.6 F (37 C), temperature source Oral, resp. rate 16, height 5\' 9"  (1.753 m), weight 148 lb (67.1 kg), last menstrual period 04/12/2016, SpO2 100 %.  Physical Exam  Nursing note and vitals reviewed. Constitutional: She is oriented to person, place, and time. She appears well-developed and well-nourished. No distress.  HENT:  Head: Normocephalic and atraumatic.  Eyes: Conjunctivae are normal. Right eye exhibits no discharge. Left eye exhibits no discharge. No scleral icterus.  Neck: Normal range of motion.  Cardiovascular: Normal rate.   Respiratory: Effort normal. No respiratory distress.  GI: Soft. Bowel sounds are normal. There is no tenderness. There is no rebound and no guarding.  Genitourinary: Uterus is enlarged. Uterus is not tender. Cervix exhibits no motion tenderness and no friability.  No bleeding in the vagina. Vaginal discharge (small amount of thin white discharge) found.  Genitourinary Comments: Cervix closed  Neurological: She is alert and oriented to person, place, and time.  Skin: Skin is warm and dry. She is not diaphoretic.  Psychiatric: She has a normal mood and affect. Her behavior is normal. Judgment and thought content normal.    MAU Course  Procedures Results for orders placed or performed during the hospital encounter of 07/11/16 (from the past 24 hour(s))  Urinalysis, Routine w  reflex microscopic ([facility])     Status: None   Collection Time: 07/11/16  9:17 AM  Result Value Ref Range   Color, Urine YELLOW YELLOW   APPearance CLEAR CLEAR   Specific Gravity, Urine 1.015 1.005 - 1.030   pH 7.0 5.0 - 8.0   Glucose, UA NEGATIVE NEGATIVE mg/dL   Hgb urine dipstick NEGATIVE NEGATIVE   Bilirubin Urine NEGATIVE NEGATIVE   Ketones, ur NEGATIVE NEGATIVE mg/dL   Protein, ur NEGATIVE NEGATIVE mg/dL   Nitrite NEGATIVE NEGATIVE   Leukocytes, UA NEGATIVE NEGATIVE  Wet prep, genital     Status: Abnormal   Collection Time: 07/11/16 10:03 AM  Result Value Ref Range   Yeast Wet Prep HPF POC NONE SEEN NONE SEEN   Trich, Wet Prep NONE SEEN NONE SEEN   Clue Cells Wet Prep HPF POC PRESENT (A) NONE SEEN   WBC, Wet Prep HPF POC MODERATE (A) NONE SEEN   Sperm <space>     MDM FHT 151 by doppler U/a negative Cervix closed GC/CT & wet prep  Assessment and Plan  A: 1. Abdominal pain during pregnancy, antepartum   2. BV (bacterial vaginosis)   3. Dysgeusia     P: Discharge home Rx flagyl & zantac Continue antiemetics as prescribed GC/CT pending Keep OB appt  Discussed reasons to return to MAU  Judeth HornErin Dymon [place] 07/11/2016, 9:47 AM

## 2016-07-11 NOTE — MAU Note (Signed)
Patient presents with lower abdominal pain x 1 week sometimes sharp in nature.

## 2016-07-11 NOTE — Discharge Instructions (Signed)
Abdominal Pain During Pregnancy Belly (abdominal) pain is common during pregnancy. Most of the time, it is not a serious problem. Other times, it can be a sign that something is wrong with the pregnancy. Always tell your doctor if you have belly pain. HOME CARE Monitor your belly pain for any changes. The following actions may help you feel better:  Do not have sex (intercourse) or put anything in your vagina until you feel better.  Rest until your pain stops.  Drink clear fluids if you feel sick to your stomach (nauseous). Do not eat solid food until you feel better.  Only take medicine as told by your doctor.  Keep all doctor visits as told. GET HELP RIGHT AWAY IF:   You are bleeding, leaking fluid, or pieces of tissue come out of your vagina.  You have more pain or cramping.  You keep throwing up (vomiting).  You have pain when you pee (urinate) or have blood in your pee.  You have a fever.  You do not feel your baby moving as much.  You feel very weak or feel like passing out.  You have trouble breathing, with or without belly pain.  You have a very bad headache and belly pain.  You have fluid leaking from your vagina and belly pain.  You keep having watery poop (diarrhea).  Your belly pain does not go away after resting, or the pain gets worse. MAKE SURE YOU:   Understand these instructions.  Will watch your condition.  Will get help right away if you are not doing well or get worse.   This information is not intended to replace advice given to you by your health care provider. Make sure you discuss any questions you have with your health care provider.   Document Released: 10/20/2009 Document Revised: 07/04/2013 Document Reviewed: 05/31/2013 Elsevier Interactive Patient Education 2016 Elsevier Inc.      Bacterial Vaginosis Bacterial vaginosis is an infection of the vagina. It happens when too many germs (bacteria) grow in the vagina. Having this  infection puts you at risk for getting other infections from sex. Treating this infection can help lower your risk for other infections, such as:   Chlamydia.  Gonorrhea.  HIV.  Herpes. HOME CARE  Take your medicine as told by your doctor.  Finish your medicine even if you start to feel better.  During treatment:  Avoid sex or use condoms correctly.  Do not douche.  Do not drink alcohol unless your doctor tells you it is ok.  Do not breastfeed unless your doctor tells you it is ok. GET HELP IF:  You are not getting better after 3 days of treatment.  You have more grey fluid (discharge) coming from your vagina than before.  You have more pain than before.  You have a fever. MAKE SURE YOU:   Understand these instructions.  Will watch your condition.  Will get help right away if you are not doing well or get worse.   This information is not intended to replace advice given to you by your health care provider. Make sure you discuss any questions you have with your health care provider.   Document Released: 08/10/2008 Document Revised: 11/22/2014 Document Reviewed: 06/13/2013 Elsevier Interactive Patient Education [employer]2016 Elsevier Inc.

## 2016-07-12 LAB — GC/CHLAMYDIA PROBE AMP (~~LOC~~) NOT AT ARMC
CHLAMYDIA, DNA PROBE: NEGATIVE
Neisseria Gonorrhea: NEGATIVE

## 2016-07-20 ENCOUNTER — Encounter: Payer: Self-pay | Admitting: Advanced Practice Midwife

## 2016-07-20 ENCOUNTER — Ambulatory Visit (INDEPENDENT_AMBULATORY_CARE_PROVIDER_SITE_OTHER): Payer: Medicaid Other | Admitting: Advanced Practice Midwife

## 2016-07-20 VITALS — BP 112/53 | HR 74 | Wt 148.0 lb

## 2016-07-20 DIAGNOSIS — O34219 Maternal care for unspecified type scar from previous cesarean delivery: Secondary | ICD-10-CM

## 2016-07-20 DIAGNOSIS — Z124 Encounter for screening for malignant neoplasm of cervix: Secondary | ICD-10-CM | POA: Diagnosis not present

## 2016-07-20 DIAGNOSIS — Z3490 Encounter for supervision of normal pregnancy, unspecified, unspecified trimester: Secondary | ICD-10-CM | POA: Insufficient documentation

## 2016-07-20 DIAGNOSIS — Z3482 Encounter for supervision of other normal pregnancy, second trimester: Secondary | ICD-10-CM | POA: Diagnosis present

## 2016-07-20 DIAGNOSIS — Z23 Encounter for immunization: Secondary | ICD-10-CM | POA: Diagnosis not present

## 2016-07-20 DIAGNOSIS — Z3480 Encounter for supervision of other normal pregnancy, unspecified trimester: Secondary | ICD-10-CM

## 2016-07-20 DIAGNOSIS — Z3689 Encounter for other specified antenatal screening: Secondary | ICD-10-CM

## 2016-07-20 LAB — POCT URINALYSIS DIP (DEVICE)
Bilirubin Urine: NEGATIVE
Glucose, UA: NEGATIVE mg/dL
HGB URINE DIPSTICK: NEGATIVE
KETONES UR: NEGATIVE mg/dL
LEUKOCYTES UA: NEGATIVE
Nitrite: NEGATIVE
Protein, ur: NEGATIVE mg/dL
SPECIFIC GRAVITY, URINE: 1.02 (ref 1.005–1.030)
UROBILINOGEN UA: 1 mg/dL (ref 0.0–1.0)
pH: 6.5 (ref 5.0–8.0)

## 2016-07-20 NOTE — Progress Notes (Signed)
Subjective:    Michele Maldonado is a [id number] [redacted]w[redacted]d being seen today for her first obstetrical visit.  Her obstetrical history is significant for Prior C/S, desired TOLAC. Patient does intend to breast feed. Pregnancy history fully reviewed.  Patient reports no complaints.  Vitals:   07/20/16 1301  BP: (!) 112/53  Pulse: 74  Weight: 148 lb (67.1 kg)    HISTORY: OB History  Gravida Para Term Preterm AB Living  5 4 3 1  0 4  SAB TAB Ectopic Multiple Live Births  0 0 0 0 4    # Outcome Date GA Lbr Len/2nd Weight Sex Delivery Anes PTL Lv  5 Current           4 Preterm 12/29/13 [redacted]w[redacted]d  4 lb 5 oz (1.956 kg) M CS-LTranv Spinal  LIV  3 Term 04/07/10 [redacted]w[redacted]d  9 lb (4.082 kg) M Vag-Spont EPI  LIV  2 Term 12/20/08 [redacted]w[redacted]d  8 lb 4 oz (3.742 kg) M Vag-Spont EPI  LIV  1 Term 06/07/06 100w0d  7 lb 14 oz (3.572 kg) F Vag-Spont EPI  LIV     Past Medical History:  Diagnosis Date  . MVP (mitral valve prolapse)    Past Surgical History:  Procedure Laterality Date  . CESAREAN SECTION N/A 12/29/2013   Procedure: CESAREAN SECTION;  Surgeon: Allie Bossier, MD;  Location: [facility];  Service: Obstetrics;  Laterality: N/A;  . REPAIR VAGINAL CUFF N/A 12/30/2013   Procedure: REPAIR VAGINAL CUFF;  Surgeon: Allie Bossier, MD;  Location: [facility];  Service: Gynecology;  Laterality: N/A;  Insertion of Bakri Balloon   Family History  Problem Relation Age of Onset  . Hypertension Maternal Grandmother   . Diabetes Maternal Grandmother      Exam    Uterus:     Pelvic Exam:    Perineum: No Hemorrhoids, Normal Perineum   Vulva: Bartholin's, Urethra, Skene's normal   Vagina:  normal mucosa, normal discharge   pH:    Cervix: multiparous appearance   Adnexa: no mass, fullness, tenderness   Bony Pelvis: gynecoid  System: Breast:  normal appearance, no masses or tenderness   Skin: normal coloration and turgor, no rashes    Neurologic: oriented, grossly non-focal   Extremities: normal strength, tone, and muscle mass    HEENT    Mouth/Teeth mucous membranes moist, pharynx normal without lesions   Neck supple and no masses   Cardiovascular: regular rate and rhythm   Respiratory:  appears well, vitals normal, no respiratory distress, acyanotic, normal RR, ear and throat exam is normal, neck free of mass or lymphadenopathy, chest clear, no wheezing, crepitations, rhonchi, normal symmetric air entry   Abdomen: soft, non-tender; bowel sounds normal; no masses,  no organomegaly   Urinary: urethral meatus normal      Assessment:    Pregnancy: A5W0981 Patient Active Problem List   Diagnosis Date Noted  . Normal IUP (intrauterine pregnancy) on prenatal ultrasound 05/25/2016  . S/P C-section 12/30/2013  . Thrombocytopenia, unspecified (HCC) 12/27/2013  . Hypoalbuminemia 12/27/2013  . Dehydration 12/27/2013  . Lymphedema of genitalia 12/27/2013  . Vulvar edema 12/26/2013        Plan:     Initial labs drawn. Prenatal vitamins. Problem list reviewed and updated. Genetic Screening discussed Quad Screen: ordered.  Ultrasound discussed; fetal survey: ordered.  Follow up in 4 weeks. 50% of 30 min visit spent on counseling and coordination of care.   Routines reviewed Welcomed to practice  Discussed prior C/S. Wants  TOLAC, states hated recovery from C/S.  [facility],Jaideep Pollack 07/20/2016

## 2016-07-20 NOTE — Patient Instructions (Signed)

## 2016-07-21 LAB — PRENATAL PROFILE (SOLSTAS)
Antibody Screen: NEGATIVE
BASOS ABS: 0 {cells}/uL (ref 0–200)
BASOS PCT: 0 %
EOS ABS: 90 {cells}/uL (ref 15–500)
Eosinophils Relative: 1 %
HCT: 34.7 % — ABNORMAL LOW (ref 35.0–45.0)
HEP B S AG: NEGATIVE
HIV: NONREACTIVE
Hemoglobin: 11.8 g/dL (ref 11.7–15.5)
LYMPHS ABS: 2610 {cells}/uL (ref 850–3900)
Lymphocytes Relative: 29 %
MCH: 30.5 pg (ref 27.0–33.0)
MCHC: 34 g/dL (ref 32.0–36.0)
MCV: 89.7 fL (ref 80.0–100.0)
MONO ABS: 630 {cells}/uL (ref 200–950)
MONOS PCT: 7 %
MPV: 9.9 fL (ref 7.5–12.5)
Neutro Abs: 5670 cells/uL (ref 1500–7800)
Neutrophils Relative %: 63 %
PLATELETS: 186 10*3/uL (ref 140–400)
RBC: 3.87 MIL/uL (ref 3.80–5.10)
RDW: 13.5 % (ref 11.0–15.0)
RUBELLA: 3.43 {index} — AB (ref <0.90)
Rh Type: POSITIVE
WBC: 9 10*3/uL (ref 3.8–10.8)

## 2016-07-21 LAB — AFP, QUAD SCREEN
AFP: 30.1 ng/mL
Curr Gest Age: 14.1 weeks
HCG TOTAL: 55.24 [IU]/mL
INH: 197.9 pg/mL
INTERPRETATION-AFP: NEGATIVE
MOM FOR AFP: 0.99
MOM FOR INH: 0.99
MoM for hCG: 0.81
OPEN SPINA BIFIDA: NEGATIVE
TRI 18 SCR RISK EST: NEGATIVE
Trisomy 18 (Edward) Syndrome Interp.: 1:16100 {titer}
UE3 MOM: 0.73
UE3 VALUE: 0.36 ng/mL

## 2016-07-22 LAB — PAIN MGMT, PROFILE 6 CONF W/O MM, U
6 ACETYLMORPHINE: NEGATIVE ng/mL (ref <10)
ALCOHOL METABOLITES: NEGATIVE ng/mL (ref <500)
Amphetamines: NEGATIVE ng/mL (ref <500)
BARBITURATES: NEGATIVE ng/mL (ref <300)
BENZODIAZEPINES: NEGATIVE ng/mL (ref <100)
COCAINE METABOLITE: NEGATIVE ng/mL (ref <150)
CREATININE: 93.6 mg/dL (ref >=20.0)
MARIJUANA METABOLITE: 9 ng/mL — AB (ref <5)
METHADONE METABOLITE: NEGATIVE ng/mL (ref <100)
Marijuana Metabolite: POSITIVE ng/mL — AB (ref <20)
OXYCODONE: NEGATIVE ng/mL (ref <100)
Opiates: NEGATIVE ng/mL (ref <100)
Oxidant: NEGATIVE ug/mL (ref <200)
PH: 7.07 (ref 4.5–9.0)
PHENCYCLIDINE: NEGATIVE ng/mL (ref <25)
Please note:: 0

## 2016-07-22 LAB — CYTOLOGY - PAP

## 2016-07-23 LAB — HEMOGLOBINOPATHY EVALUATION
HCT: 34.7 % — ABNORMAL LOW (ref 35.0–45.0)
HEMOGLOBIN: 11.8 g/dL (ref 11.7–15.5)
HGB A2 QUANT: 2.5 % (ref 1.8–3.5)
Hgb A: 96.5 % (ref >96.0)
MCH: 30.5 pg (ref 27.0–33.0)
MCV: 89.7 fL (ref 80.0–100.0)
RBC: 3.87 MIL/uL (ref 3.80–5.10)
RDW: 13.5 % (ref 11.0–15.0)

## 2016-07-24 ENCOUNTER — Encounter (HOSPITAL_COMMUNITY): Payer: Self-pay | Admitting: Advanced Practice Midwife

## 2016-07-24 DIAGNOSIS — F1210 Cannabis abuse, uncomplicated: Secondary | ICD-10-CM | POA: Insufficient documentation

## 2016-07-24 LAB — CULTURE, OB URINE

## 2016-07-26 ENCOUNTER — Telehealth: Payer: Self-pay

## 2016-07-26 ENCOUNTER — Other Ambulatory Visit (HOSPITAL_COMMUNITY): Payer: Self-pay | Admitting: Advanced Practice Midwife

## 2016-07-26 MED ORDER — NITROFURANTOIN MONOHYD MACRO 100 MG PO CAPS: 100.0000 mg | ORAL_CAPSULE | Freq: Two times a day (BID) | ORAL | 1 refills | Status: DC

## 2016-07-26 NOTE — Progress Notes (Signed)
Enterococcus UTI  Rx Macrobid

## 2016-07-26 NOTE — Telephone Encounter (Signed)
  I attempted to call patient but there was no answer or voicemail to leave a message.   UTI   Rx Macrobid   Can you call her?

## 2016-07-27 NOTE — Telephone Encounter (Signed)
Patient has been advised of lab results and will follow up at next schedule appt.

## 2016-08-10 ENCOUNTER — Encounter (HOSPITAL_COMMUNITY): Payer: Self-pay | Admitting: Advanced Practice Midwife

## 2016-08-17 ENCOUNTER — Encounter: Payer: Self-pay | Admitting: Obstetrics and Gynecology

## 2016-08-19 ENCOUNTER — Ambulatory Visit (HOSPITAL_COMMUNITY): Payer: Medicaid Other

## 2016-08-20 ENCOUNTER — Other Ambulatory Visit (HOSPITAL_COMMUNITY): Payer: Self-pay

## 2016-08-25 ENCOUNTER — Ambulatory Visit (HOSPITAL_COMMUNITY)
Admission: RE | Admit: 2016-08-25 | Discharge: 2016-08-25 | Disposition: A | Discharge status: Home / Self Care | Payer: Medicaid Other | Source: Ambulatory Visit | Attending: Advanced Practice Midwife | Admitting: Advanced Practice Midwife

## 2016-08-25 DIAGNOSIS — Z3A19 19 weeks gestation of pregnancy: Secondary | ICD-10-CM | POA: Diagnosis not present

## 2016-08-25 DIAGNOSIS — O09292 Supervision of pregnancy with other poor reproductive or obstetric history, second trimester: Secondary | ICD-10-CM | POA: Diagnosis not present

## 2016-08-25 DIAGNOSIS — Z363 Encounter for antenatal screening for malformations: Secondary | ICD-10-CM | POA: Diagnosis not present

## 2016-08-25 DIAGNOSIS — O34219 Maternal care for unspecified type scar from previous cesarean delivery: Secondary | ICD-10-CM | POA: Diagnosis not present

## 2016-08-25 DIAGNOSIS — Z3689 Encounter for other specified antenatal screening: Secondary | ICD-10-CM

## 2016-08-27 ENCOUNTER — Encounter: Payer: Self-pay | Admitting: Obstetrics & Gynecology

## 2016-09-16 ENCOUNTER — Ambulatory Visit (INDEPENDENT_AMBULATORY_CARE_PROVIDER_SITE_OTHER): Payer: Medicaid Other | Admitting: Family Medicine

## 2016-09-16 VITALS — BP 123/65 | HR 72 | Wt 155.0 lb

## 2016-09-16 DIAGNOSIS — O23592 Infection of other part of genital tract in pregnancy, second trimester: Secondary | ICD-10-CM

## 2016-09-16 DIAGNOSIS — N760 Acute vaginitis: Secondary | ICD-10-CM

## 2016-09-16 DIAGNOSIS — Z3480 Encounter for supervision of other normal pregnancy, unspecified trimester: Secondary | ICD-10-CM

## 2016-09-16 DIAGNOSIS — D696 Thrombocytopenia, unspecified: Secondary | ICD-10-CM

## 2016-09-16 DIAGNOSIS — O99112 Other diseases of the blood and blood-forming organs and certain disorders involving the immune mechanism complicating pregnancy, second trimester: Secondary | ICD-10-CM

## 2016-09-16 DIAGNOSIS — O34219 Maternal care for unspecified type scar from previous cesarean delivery: Secondary | ICD-10-CM

## 2016-09-16 MED ORDER — TERCONAZOLE 0.4 % VA CREA: 1.0000 | TOPICAL_CREAM | Freq: Every day | VAGINAL | 0 refills | Status: AC

## 2016-09-16 NOTE — Progress Notes (Signed)
Subjective:  Michele Maldonado is a 30 y.o. [id number] at 467w3d being seen today for ongoing prenatal care.  She is currently monitored for the following issues for this high-risk pregnancy and has Thrombocytopenia, unspecified; Previous cesarean delivery, antepartum; Supervision of normal pregnancy, antepartum; and Marijuana abuse on her problem list.  Patient reports vaginal irritation.  Contractions: Not present.  .  Movement: Present. Denies leaking of fluid.   The following portions of the patient's history were reviewed and updated as appropriate: allergies, current medications, past family history, past medical history, past social history, past surgical history and problem list. Problem list updated.  Objective:   Vitals:   09/16/16 1516  BP: 123/65  Pulse: 72  Weight: 155 lb (70.3 kg)    Fetal Status: Fetal Heart Rate (bpm): 142   Movement: Present  FH: 23cm    General:  Alert, oriented and cooperative. Patient is in no acute distress.  Skin: Skin is warm and dry. No rash noted.   Cardiovascular: Normal heart rate noted  Respiratory: Normal respiratory effort, no problems with respiration noted  Abdomen: Soft, gravid, appropriate for gestational age. Pain/Pressure: Absent     Pelvic:  Cervical exam deferred       Vaginal discharge is white, chunky with erythematous vaginal mucosa (+Yeast appearing discharge)  Extremities: Normal range of motion.     Mental Status: Normal mood and affect. Normal behavior. Normal judgment and thought content.   Urinalysis:      Assessment and Plan:  Pregnancy: A5W0981G5P3104 at 1367w3d  1. Supervision of other normal pregnancy, antepartum - 2 hr GTT next visit  2. Vaginitis affecting pregnancy in second trimester, antepartum - terconazole (TERAZOL 7) 0.4 % vaginal cream; Place 1 applicator vaginally at bedtime.  Dispense: 45 g; Refill: 0   Preterm labor symptoms and general obstetric precautions including but not limited to vaginal bleeding,  contractions, leaking of fluid and fetal movement were reviewed in detail with the patient. Please refer to After Visit Summary for other counseling recommendations.  Return in about 4 weeks (around 10/14/2016) for Routine OB visit, needs AM visit for 2 hr GTT.   [facility] [place], [place]

## 2016-09-16 NOTE — Patient Instructions (Signed)
Vaginitis Vaginitis is an inflammation of the vagina. It can happen when the normal bacteria and yeast in the vagina grow too much. There are different types. Treatment will depend on the type you have. HOME CARE  Take all medicines as told by your doctor.  Keep your vagina area clean and dry. Avoid soap. Rinse the area with water.  Avoid washing and cleaning out the vagina (douching).  Do not use tampons or have sex (intercourse) until your treatment is done.  Wipe from front to back after going to the restroom.  Wear cotton underwear.  Avoid wearing underwear while you sleep until your vaginitis is gone.  Avoid tight pants. Avoid underwear or nylons without a cotton panel.  Take off wet clothing (such as a bathing suit) as soon as you can.  Use mild, unscented products. Avoid fabric softeners and scented:  Feminine sprays.  Laundry detergents.  Tampons.  Soaps or bubble baths.  Practice safe sex and use condoms. GET HELP RIGHT AWAY IF:   You have belly (abdominal) pain.  You have a fever or lasting symptoms for more than 2-3 days.  You have a fever and your symptoms suddenly get worse. MAKE SURE YOU:   Understand these instructions.  Will watch this condition.  Will get help right away if you are not doing well or get worse.   This information is not intended to replace advice given to you by your health care provider. Make sure you discuss any questions you have with your health care provider.   Document Released: 01/28/2009 Document Revised: 07/26/2012 Document Reviewed: 04/13/2012 Elsevier Interactive Patient Education 2016 [employer] Inc[employer]   Glucose Tolerance Test During Pregnancy The glucose tolerance test (GTT) is a blood test used to determine if you have developed a type of diabetes during pregnancy (gestational diabetes). This is when your body does not properly process sugar (glucose) in the food you eat, resulting in high blood glucose levels.  Typically, a GTT is done after you have had a 1-hour glucose test with results that indicate you possibly have gestational diabetes. It may also be done if:  You have a history of giving birth to very large babies or have experienced repeated fetal loss (stillbirth).   You have signs and symptoms of diabetes, such as:   Changes in your vision.   Tingling or numbness in your hands or feet.   Changes in hunger, thirst, and urination not otherwise explained by your pregnancy.  The GTT lasts about 3 hours. You will be given a sugar-water solution to drink at the beginning of the test. You will have blood drawn before you drink the solution and then again 1, 2, and 3 hours after you drink it. You will not be allowed to eat or drink anything else during the test. You must remain at the testing location to make sure that your blood is drawn on time. You should also avoid exercising during the test, because exercise can alter test results. PREPARATION FOR TEST  Eat normally for 3 days prior to the GTT test, including having plenty of carbohydrate-rich foods. Do not eat or drink anything except water during the final 12 hours before the test. In addition, your health care provider may ask you to stop taking certain medicines before the test. RESULTS  It is your responsibility to obtain your test results. Ask the lab or department performing the test when and how you will get your results. Contact your health care provider to discuss any  questions you have about your results.  Range of Normal Values Ranges for normal values may vary among different labs and hospitals. You should always check with your health care provider after having lab work or other tests done to discuss whether your values are considered within normal limits. Normal levels of blood glucose are as follows:  Fasting: less than 105 mg/dL.   1 hour after drinking the solution: less than 190 mg/dL.   2 hours after drinking the  solution: less than 165 mg/dL.   3 hours after drinking the solution: less than 145 mg/dL.  Some substances can interfere with GTT results. These may include:  Blood pressure and heart failure medicines, including beta blockers, furosemide, and thiazides.   Anti-inflammatory medicines, including aspirin.   Nicotine.   Some psychiatric medicines.  Meaning of Results Outside Normal Value Ranges GTT test results that are above normal values may indicate a number of health problems, such as:   Gestational diabetes.   Acute stress response.   Cushing syndrome.   Tumors such as pheochromocytoma or glucagonoma.   Long-term kidney problems.   Pancreatitis.   Hyperthyroidism.   Current infection.  Discuss your test results with your health care provider. He or she will use the results to make a diagnosis and determine a treatment plan that is right for you.   This information is not intended to replace advice given to you by your health care provider. Make sure you discuss any questions you have with your health care provider.   Document Released: 05/02/2012 Document Revised: 11/22/2014 Document Reviewed: 03/08/2014 Elsevier Interactive Patient Education [employer]2016 Elsevier Inc.

## 2016-10-13 ENCOUNTER — Encounter: Payer: Medicaid Other | Admitting: Advanced Practice Midwife

## 2016-10-14 ENCOUNTER — Encounter: Payer: Medicaid Other | Admitting: Family

## 2016-10-26 ENCOUNTER — Ambulatory Visit (INDEPENDENT_AMBULATORY_CARE_PROVIDER_SITE_OTHER): Payer: Medicaid Other | Admitting: Advanced Practice Midwife

## 2016-10-26 ENCOUNTER — Encounter: Payer: Self-pay | Admitting: Obstetrics and Gynecology

## 2016-10-26 VITALS — BP 101/57 | HR 74 | Wt 178.4 lb

## 2016-10-26 DIAGNOSIS — Z3403 Encounter for supervision of normal first pregnancy, third trimester: Secondary | ICD-10-CM

## 2016-10-26 DIAGNOSIS — N9089 Other specified noninflammatory disorders of vulva and perineum: Secondary | ICD-10-CM

## 2016-10-26 DIAGNOSIS — Z87441 Personal history of nephrotic syndrome: Secondary | ICD-10-CM | POA: Insufficient documentation

## 2016-10-26 LAB — CBC
HEMATOCRIT: 33.6 % — AB (ref 35.0–45.0)
HEMOGLOBIN: 11.4 g/dL — AB (ref 11.7–15.5)
MCH: 30.9 pg (ref 27.0–33.0)
MCHC: 33.9 g/dL (ref 32.0–36.0)
MCV: 91.1 fL (ref 80.0–100.0)
MPV: 10 fL (ref 7.5–12.5)
Platelets: 143 10*3/uL (ref 140–400)
RBC: 3.69 MIL/uL — AB (ref 3.80–5.10)
RDW: 13.8 % (ref 11.0–15.0)
WBC: 8.3 10*3/uL (ref 3.8–10.8)

## 2016-10-26 MED ORDER — TETANUS-DIPHTH-ACELL PERTUSSIS 5-2.5-18.5 LF-MCG/0.5 IM SUSP: 0.5000 mL | Freq: Once | INTRAMUSCULAR | Status: DC

## 2016-10-26 NOTE — Progress Notes (Signed)
   PRENATAL VISIT NOTE  Subjective:  Michele Maldonado is a 30 y.o. H2C9470 at 76w1dbeing seen today for ongoing prenatal care.  She is currently monitored for the following issues for this high-risk pregnancy and has Thrombocytopenia (HBlanchard; Previous cesarean delivery, antepartum; Supervision of normal pregnancy, antepartum; Marijuana abuse; and History of nephrotic syndrome on her problem list.  Patient reports swelling of left labia when standing.  Contractions: Not present. Vag. Bleeding: None.  Movement: Present. Denies leaking of fluid.   History is remarkable for sigificant swelling of legs and labia.  Renal diagnosed her with nephrotic syndrome. It was so bad that they did a C/S at 33 weeks.  The following portions of the patient's history were reviewed and updated as appropriate: allergies, current medications, past family history, past medical history, past social history, past surgical history and problem list. Problem list updated.  Objective:   Vitals:   10/26/16 1317  BP: (!) 101/57  Pulse: 74  Weight: 178 lb 6.4 oz (80.9 kg)    Fetal Status: Fetal Heart Rate (bpm): 154   Movement: Present     General:  Alert, oriented and cooperative. Patient is in no acute distress.  Skin: Skin is warm and dry. No rash noted.   Cardiovascular: Normal heart rate noted  Respiratory: Normal respiratory effort, no problems with respiration noted  Abdomen: Soft, gravid, appropriate for gestational age. Pain/Pressure: Present     Pelvic:  Cervical exam performed       left labia more edematous than right. Not quite as big as a Bartholins (this edema is anterior to B gland).  Right labia less edematous. No edema in feet or legs  Extremities: Normal range of motion.  Edema: None  Mental Status: Normal mood and affect. Normal behavior. Normal judgment and thought content.   Assessment and Plan:  Pregnancy: [id number] 251w1d1. Supervision of low-risk first pregnancy, third trimester      -  RPR - HIV antibody (with reflex) - CBC - Glucose Tolerance, 1 HR (50g) w/o Fasting  2. Vulvar edema       Since there are no baseline labs will do them today.  So that if edema increases drastically again, we can compare.  Patient has already stopped working so she won't have to stand, which is when she gets the swelling more.  - Comp Met (CMET) - Uric acid - Lactate Dehydrogenase (LDH)  3. History of nephrotic syndrome      Baseline labs today.   Preterm labor symptoms and general obstetric precautions including but not limited to vaginal bleeding, contractions, leaking of fluid and fetal movement were reviewed in detail with the patient. Please refer to After Visit Summary for other counseling recommendations.  [facility] (around [facility]09/2016[facility]  MaSeabron SpatesCNM

## 2016-10-26 NOTE — Patient Instructions (Signed)

## 2016-10-26 NOTE — Progress Notes (Signed)
1 hr glucola due @ 220; 28 wk labs today  Tdap vaccine given today 28 wk packet given  Pt reports swelling on left labia

## 2016-10-27 ENCOUNTER — Telehealth: Payer: Self-pay | Admitting: General Practice

## 2016-10-27 LAB — HIV ANTIBODY (ROUTINE TESTING W REFLEX): HIV 1&2 Ab, 4th Generation: NONREACTIVE

## 2016-10-27 LAB — COMPREHENSIVE METABOLIC PANEL
ALT: 6 U/L (ref 6–29)
AST: 12 U/L (ref 10–30)
Albumin: 3.3 g/dL — ABNORMAL LOW (ref 3.6–5.1)
Alkaline Phosphatase: 48 U/L (ref 33–115)
BUN: 7 mg/dL (ref 7–25)
CHLORIDE: 104 mmol/L (ref 98–110)
CO2: 24 mmol/L (ref 20–31)
Calcium: 8.3 mg/dL — ABNORMAL LOW (ref 8.6–10.2)
Creat: 0.56 mg/dL (ref 0.50–1.10)
Glucose, Bld: 59 mg/dL — ABNORMAL LOW (ref 65–99)
POTASSIUM: 3.8 mmol/L (ref 3.5–5.3)
Sodium: 137 mmol/L (ref 135–146)
TOTAL PROTEIN: 6.4 g/dL (ref 6.1–8.1)
Total Bilirubin: 0.2 mg/dL (ref 0.2–1.2)

## 2016-10-27 LAB — LACTATE DEHYDROGENASE: LDH: 151 U/L (ref 100–200)

## 2016-10-27 LAB — URIC ACID: URIC ACID, SERUM: 2.7 mg/dL (ref 2.5–7.0)

## 2016-10-27 LAB — GLUCOSE TOLERANCE, 1 HOUR (50G) W/O FASTING: Glucose, 1 Hr, gestational: 60 mg/dL — ABNORMAL LOW (ref <140)

## 2016-10-27 LAB — RPR

## 2016-10-27 NOTE — Telephone Encounter (Signed)
Patient called and left message stating she is calling about her series of shots and her appt from yesterday. Called patient and she asked what test were ran yesterday and asked what the results were. Informed patient of results. She asked if we did not do repeat drug screens because hers was positive in September. Told patient we do not but we can at her next visit if she would like, she just needs to let us know when she gets here. Patient verbalized understanding and had no questions

## 2016-11-10 ENCOUNTER — Encounter: Payer: Medicaid Other | Admitting: Family

## 2016-11-15 NOTE — L&D Delivery Note (Signed)
Delivery Note At 2:05 PM a viable female was delivered via successful VBAC, Spontaneous (Presentation: ROA; vertex).  APGAR: 8, 9; weight pending.   Placenta status: spontaneously, intact.  Cord: 3 vessel. With no complications: .  Cord pH: n/a Sterile gloves and gown worn the entire time, cord clamp delayed for 60 seconds, clamped x2 and cut.  Anesthesia:  epidural Episiotomy:  none Lacerations:  none Suture Repair: n/a Est. Blood Loss (mL):  100cc  Mom to postpartum.  Baby to Couplet care / Skin to Skin.  Nehemiah SettleAna Carvalho do Silvio ClaymanAmaral MD PGY1 01/20/2017, 2:29 PM  OB FELLOW DELIVERY ATTESTATION  I was gloved and present for the delivery in its entirety, and I agree with the above resident's note.    Jen MowElizabeth Binyomin Brann, DO OB Fellow

## 2016-11-24 ENCOUNTER — Encounter: Payer: Medicaid Other | Admitting: Family Medicine

## 2016-12-08 ENCOUNTER — Encounter: Payer: Medicaid Other | Admitting: Obstetrics and Gynecology

## 2016-12-15 ENCOUNTER — Ambulatory Visit (INDEPENDENT_AMBULATORY_CARE_PROVIDER_SITE_OTHER): Payer: Medicaid Other | Admitting: Family Medicine

## 2016-12-15 VITALS — BP 118/74 | HR 104 | Wt 193.6 lb

## 2016-12-15 DIAGNOSIS — O34219 Maternal care for unspecified type scar from previous cesarean delivery: Secondary | ICD-10-CM

## 2016-12-15 DIAGNOSIS — Z3480 Encounter for supervision of other normal pregnancy, unspecified trimester: Secondary | ICD-10-CM

## 2016-12-15 DIAGNOSIS — Z3483 Encounter for supervision of other normal pregnancy, third trimester: Secondary | ICD-10-CM

## 2016-12-15 LAB — POCT URINALYSIS DIP (DEVICE)
BILIRUBIN URINE: NEGATIVE
Glucose, UA: NEGATIVE mg/dL
Ketones, ur: NEGATIVE mg/dL
NITRITE: NEGATIVE
Protein, ur: NEGATIVE mg/dL
Specific Gravity, Urine: 1.015 (ref 1.005–1.030)
Urobilinogen, UA: 1 mg/dL (ref 0.0–1.0)
pH: 6.5 (ref 5.0–8.0)

## 2016-12-15 NOTE — Progress Notes (Signed)
[place] while [place] she went to the ER for pain. Patient states they did an ultrasound and told her she had too much fluid and wants to know if doctor ever talked to her about polyhydramnios.

## 2016-12-15 NOTE — Patient Instructions (Signed)
Third Trimester of Pregnancy The third trimester is from week 29 through week 40 (months 7 through 9). The third trimester is a time when the unborn baby (fetus) is growing rapidly. At the end of the ninth month, the fetus is about 20 inches in length and weighs 6-10 pounds. Body changes during your third trimester Your body goes through many changes during pregnancy. The changes vary from woman to woman. During the third trimester:  Your weight will continue to increase. You can expect to gain 25-35 pounds (11-16 kg) by the end of the pregnancy.  You may begin to get stretch marks on your hips, abdomen, and breasts.  You may urinate more often because the fetus is moving lower into your pelvis and pressing on your bladder.  You may develop or continue to have heartburn. This is caused by increased hormones that slow down muscles in the digestive tract.  You may develop or continue to have constipation because increased hormones slow digestion and cause the muscles that push waste through your intestines to relax.  You may develop hemorrhoids. These are swollen veins (varicose veins) in the rectum that can itch or be painful.  You may develop swollen, bulging veins (varicose veins) in your legs.  You may have increased body aches in the pelvis, back, or thighs. This is due to weight gain and increased hormones that are relaxing your joints.  You may have changes in your hair. These can include thickening of your hair, rapid growth, and changes in texture. Some women also have hair loss during or after pregnancy, or hair that feels dry or thin. Your hair will most likely return to normal after your baby is born.  Your breasts will continue to grow and they will continue to become tender. A yellow fluid (colostrum) may leak from your breasts. This is the first milk you are producing for your baby.  Your belly button may stick out.  You may notice more swelling in your hands, face, or  ankles.  You may have increased tingling or numbness in your hands, arms, and legs. The skin on your belly may also feel numb.  You may feel short of breath because of your expanding uterus.  You may have more problems sleeping. This can be caused by the size of your belly, increased need to urinate, and an increase in your body's metabolism.  You may notice the fetus "dropping," or moving lower in your abdomen.  You may have increased vaginal discharge.  Your cervix becomes thin and soft (effaced) near your due date. What to expect at prenatal visits You will have prenatal exams every 2 weeks until week 36. Then you will have weekly prenatal exams. During a routine prenatal visit:  You will be weighed to make sure you and the fetus are growing normally.  Your blood pressure will be taken.  Your abdomen will be measured to track your baby's growth.  The fetal heartbeat will be listened to.  Any test results from the previous visit will be discussed.  You may have a cervical check near your due date to see if you have effaced. At around 36 weeks, your health care provider will check your cervix. At the same time, your health care provider will also perform a test on the secretions of the vaginal tissue. This test is to determine if a type of bacteria, Group B streptococcus, is present. Your health care provider will explain this further. Your health care provider may ask you:    What your birth plan is.  How you are feeling.  If you are feeling the baby move.  If you have had any abnormal symptoms, such as leaking fluid, bleeding, severe headaches, or abdominal cramping.  If you are using any tobacco products, including cigarettes, chewing tobacco, and electronic cigarettes.  If you have any questions. Other tests or screenings that may be performed during your third trimester include:  Blood tests that check for low iron levels (anemia).  Fetal testing to check the health,  activity level, and growth of the fetus. Testing is done if you have certain medical conditions or if there are problems during the pregnancy.  Nonstress test (NST). This test checks the health of your baby to make sure there are no signs of problems, such as the baby not getting enough oxygen. During this test, a belt is placed around your belly. The baby is made to move, and its heart rate is monitored during movement. What is false labor? False labor is a condition in which you feel small, irregular tightenings of the muscles in the womb (contractions) that eventually go away. These are called Braxton Hicks contractions. Contractions may last for hours, days, or even weeks before true labor sets in. If contractions come at regular intervals, become more frequent, increase in intensity, or become painful, you should see your health care provider. What are the signs of labor?  Abdominal cramps.  Regular contractions that start at 10 minutes apart and become stronger and more frequent with time.  Contractions that start on the top of the uterus and spread down to the lower abdomen and back.  Increased pelvic pressure and dull back pain.  A watery or bloody mucus discharge that comes from the vagina.  Leaking of amniotic fluid. This is also known as your "water breaking." It could be a slow trickle or a gush. Let your doctor know if it has a color or strange odor. If you have any of these signs, call your health care provider right away, even if it is before your due date. Follow these instructions at home: Eating and drinking  Continue to eat regular, healthy meals.  Do not eat:  Raw meat or meat spreads.  Unpasteurized milk or cheese.  Unpasteurized juice.  Store-made salad.  Refrigerated smoked seafood.  Hot dogs or deli meat, unless they are piping hot.  More than 6 ounces of albacore tuna a week.  Shark, swordfish, king mackerel, or tile fish.  Store-made salads.  Raw  sprouts, such as mung bean or alfalfa sprouts.  Take prenatal vitamins as told by your health care provider.  Take 1000 mg of calcium daily as told by your health care provider.  If you develop constipation:  Take over-the-counter or prescription medicines.  Drink enough fluid to keep your urine clear or pale yellow.  Eat foods that are high in fiber, such as fresh fruits and vegetables, whole grains, and beans.  Limit foods that are high in fat and processed sugars, such as fried and sweet foods. Activity  Exercise only as directed by your health care provider. Healthy pregnant women should aim for 2 hours and 30 minutes of moderate exercise per week. If you experience any pain or discomfort while exercising, stop.  Avoid heavy lifting.  Do not exercise in extreme heat or humidity, or at high altitudes.  Wear low-heel, comfortable shoes.  Practice good posture.  Do not travel far distances unless it is absolutely necessary and only with the approval   of your health care provider.  Wear your seat belt at all times while in a car, on a bus, or on a plane.  Take frequent breaks and rest with your legs elevated if you have leg cramps or low back pain.  Do not use hot tubs, steam rooms, or saunas.  You may continue to have sex unless your health care provider tells you otherwise. Lifestyle  Do not use any products that contain nicotine or tobacco, such as cigarettes and e-cigarettes. If you need help quitting, ask your health care provider.  Do not drink alcohol.  Do not use any medicinal herbs or unprescribed drugs. These chemicals affect the formation and growth of the baby.  If you develop varicose veins:  Wear support pantyhose or compression stockings as told by your healthcare provider.  Elevate your feet for 15 minutes, 3-4 times a day.  Wear a supportive maternity bra to help with breast tenderness. General instructions  Take over-the-counter and prescription  medicines only as told by your health care provider. There are medicines that are either safe or unsafe to take during pregnancy.  Take warm sitz baths to soothe any pain or discomfort caused by hemorrhoids. Use hemorrhoid cream or witch hazel if your health care provider approves.  Avoid cat litter boxes and soil used by cats. These carry germs that can cause birth defects in the baby. If you have a cat, ask someone to clean the litter box for you.  To prepare for the arrival of your baby:  Take prenatal classes to understand, practice, and ask questions about the labor and delivery.  Make a trial run to the hospital.  Visit the hospital and tour the maternity area.  Arrange for maternity or paternity leave through employers.  Arrange for family and friends to take care of pets while you are in the hospital.  Purchase a rear-facing car seat and make sure you know how to install it in your car.  Pack your hospital bag.  Prepare the baby's nursery. Make sure to remove all pillows and stuffed animals from the baby's crib to prevent suffocation.  Visit your dentist if you have not gone during your pregnancy. Use a soft toothbrush to brush your teeth and be gentle when you floss.  Keep all prenatal follow-up visits as told by your health care provider. This is important. Contact a health care provider if:  You are unsure if you are in labor or if your water has broken.  You become dizzy.  You have mild pelvic cramps, pelvic pressure, or nagging pain in your abdominal area.  You have lower back pain.  You have persistent nausea, vomiting, or diarrhea.  You have an unusual or bad smelling vaginal discharge.  You have pain when you urinate. Get help right away if:  You have a fever.  You are leaking fluid from your vagina.  You have spotting or bleeding from your vagina.  You have severe abdominal pain or cramping.  You have rapid weight loss or weight gain.  You have  shortness of breath with chest pain.  You notice sudden or extreme swelling of your face, hands, ankles, feet, or legs.  Your baby makes fewer than 10 movements in 2 hours.  You have severe headaches that do not go away with medicine.  You have vision changes. Summary  The third trimester is from week 29 through week 40, months 7 through 9. The third trimester is a time when the unborn baby (fetus)   is growing rapidly.  During the third trimester, your discomfort may increase as you and your baby continue to gain weight. You may have abdominal, leg, and back pain, sleeping problems, and an increased need to urinate.  During the third trimester your breasts will keep growing and they will continue to become tender. A yellow fluid (colostrum) may leak from your breasts. This is the first milk you are producing for your baby.  False labor is a condition in which you feel small, irregular tightenings of the muscles in the womb (contractions) that eventually go away. These are called Braxton Hicks contractions. Contractions may last for hours, days, or even weeks before true labor sets in.  Signs of labor can include: abdominal cramps; regular contractions that start at 10 minutes apart and become stronger and more frequent with time; watery or bloody mucus discharge that comes from the vagina; increased pelvic pressure and dull back pain; and leaking of amniotic fluid. This information is not intended to replace advice given to you by your health care provider. Make sure you discuss any questions you have with your health care provider. Document Released: 10/26/2001 Document Revised: 04/08/2016 Document Reviewed: 01/02/2013 [employer] Interactive Patient Education  2017 [employer]   Breastfeeding Deciding to breastfeed is one of the best choices you can make for you and your baby. A change in hormones during pregnancy causes your breast tissue to grow and increases the number and size of your  milk ducts. These hormones also allow proteins, sugars, and fats from your blood supply to make breast milk in your milk-producing glands. Hormones prevent breast milk from being released before your baby is born as well as prompt milk flow after birth. Once breastfeeding has begun, thoughts of your baby, as well as his or her sucking or crying, can stimulate the release of milk from your milk-producing glands. Benefits of breastfeeding For Your Baby  Your first milk (colostrum) helps your baby's digestive system function better.  There are antibodies in your milk that help your baby fight off infections.  Your baby has a lower incidence of asthma, allergies, and sudden infant death syndrome.  The nutrients in breast milk are better for your baby than infant formulas and are designed uniquely for your baby's needs.  Breast milk improves your baby's brain development.  Your baby is less likely to develop other conditions, such as childhood obesity, asthma, or type 2 diabetes mellitus. For You  Breastfeeding helps to create a very special bond between you and your baby.  Breastfeeding is convenient. Breast milk is always available at the correct temperature and costs nothing.  Breastfeeding helps to burn calories and helps you lose the weight gained during pregnancy.  Breastfeeding makes your uterus contract to its prepregnancy size faster and slows bleeding (lochia) after you give birth.  Breastfeeding helps to lower your risk of developing type 2 diabetes mellitus, osteoporosis, and breast or ovarian cancer later in life. Signs that your baby is hungry Early Signs of Hunger  Increased alertness or activity.  Stretching.  Movement of the head from side to side.  Movement of the head and opening of the mouth when the corner of the mouth or cheek is stroked (rooting).  Increased sucking sounds, smacking lips, cooing, sighing, or squeaking.  Hand-to-mouth movements.  Increased  sucking of fingers or hands. Late Signs of Hunger  Fussing.  Intermittent crying. Extreme Signs of Hunger  Signs of extreme hunger will require calming and consoling before your baby will   be able to breastfeed successfully. Do not wait for the following signs of extreme hunger to occur before you initiate breastfeeding:  Restlessness.  A loud, strong cry.  Screaming. Breastfeeding basics  Breastfeeding Initiation  Find a comfortable place to sit or lie down, with your neck and back well supported.  Place a pillow or rolled up blanket under your baby to bring him or her to the level of your breast (if you are seated). Nursing pillows are specially designed to help support your arms and your baby while you breastfeed.  Make sure that your baby's abdomen is facing your abdomen.  Gently massage your breast. With your fingertips, massage from your chest wall toward your nipple in a circular motion. This encourages milk flow. You may need to continue this action during the feeding if your milk flows slowly.  Support your breast with 4 fingers underneath and your thumb above your nipple. Make sure your fingers are well away from your nipple and your baby's mouth.  Stroke your baby's lips gently with your finger or nipple.  When your baby's mouth is open wide enough, quickly bring your baby to your breast, placing your entire nipple and as much of the colored area around your nipple (areola) as possible into your baby's mouth.  More areola should be visible above your baby's upper lip than below the lower lip.  Your baby's tongue should be between his or her lower gum and your breast.  Ensure that your baby's mouth is correctly positioned around your nipple (latched). Your baby's lips should create a seal on your breast and be turned out (everted).  It is common for your baby to suck about 2-3 minutes in order to start the flow of breast milk. Latching  Teaching your baby how to latch  on to your breast properly is very important. An improper latch can cause nipple pain and decreased milk supply for you and poor weight gain in your baby. Also, if your baby is not latched onto your nipple properly, he or she may swallow some air during feeding. This can make your baby fussy. Burping your baby when you switch breasts during the feeding can help to get rid of the air. However, teaching your baby to latch on properly is still the best way to prevent fussiness from swallowing air while breastfeeding. Signs that your baby has successfully latched on to your nipple:  Silent tugging or silent sucking, without causing you pain.  Swallowing heard between every 3-4 sucks.  Muscle movement above and in front of his or her ears while sucking. Signs that your baby has not successfully latched on to nipple:  Sucking sounds or smacking sounds from your baby while breastfeeding.  Nipple pain. If you think your baby has not latched on correctly, slip your finger into the corner of your baby's mouth to break the suction and place it between your baby's gums. Attempt breastfeeding initiation again. Signs of Successful Breastfeeding  Signs from your baby:  A gradual decrease in the number of sucks or complete cessation of sucking.  Falling asleep.  Relaxation of his or her body.  Retention of a small amount of milk in his or her mouth.  Letting go of your breast by himself or herself. Signs from you:  Breasts that have increased in firmness, weight, and size 1-3 hours after feeding.  Breasts that are softer immediately after breastfeeding.  Increased milk volume, as well as a change in milk consistency and color by   the fifth day of breastfeeding.  Nipples that are not sore, cracked, or bleeding. Signs That Your Baby is Getting Enough Milk  Wetting at least 1-2 diapers during the first 24 hours after birth.  Wetting at least 5-6 diapers every 24 hours for the first week after  birth. The urine should be clear or pale yellow by 5 days after birth.  Wetting 6-8 diapers every 24 hours as your baby continues to grow and develop.  At least 3 stools in a 24-hour period by age 5 days. The stool should be soft and yellow.  At least 3 stools in a 24-hour period by age 7 days. The stool should be seedy and yellow.  No loss of weight greater than 10% of birth weight during the first 3 days of age.  Average weight gain of 4-7 ounces (113-198 g) per week after age 4 days.  Consistent daily weight gain by age 5 days, without weight loss after the age of 2 weeks. After a feeding, your baby may spit up a small amount. This is common. Breastfeeding frequency and duration Frequent feeding will help you make more milk and can prevent sore nipples and breast engorgement. Breastfeed when you feel the need to reduce the fullness of your breasts or when your baby shows signs of hunger. This is called "breastfeeding on demand." Avoid introducing a pacifier to your baby while you are working to establish breastfeeding (the first 4-6 weeks after your baby is born). After this time you may choose to use a pacifier. Research has shown that pacifier use during the first year of a baby's life decreases the risk of sudden infant death syndrome (SIDS). Allow your baby to feed on each breast as long as he or she wants. Breastfeed until your baby is finished feeding. When your baby unlatches or falls asleep while feeding from the first breast, offer the second breast. Because newborns are often sleepy in the first few weeks of life, you may need to awaken your baby to get him or her to feed. Breastfeeding times will vary from baby to baby. However, the following rules can serve as a guide to help you ensure that your baby is properly fed:  Newborns (babies 4 weeks of age or younger) may breastfeed every 1-3 hours.  Newborns should not go longer than 3 hours during the day or 5 hours during the night  without breastfeeding.  You should breastfeed your baby a minimum of 8 times in a 24-hour period until you begin to introduce solid foods to your baby at around 6 months of age. Breast milk pumping Pumping and storing breast milk allows you to ensure that your baby is exclusively fed your breast milk, even at times when you are unable to breastfeed. This is especially important if you are going back to work while you are still breastfeeding or when you are not able to be present during feedings. Your lactation consultant can give you guidelines on how long it is safe to store breast milk. A breast pump is a machine that allows you to pump milk from your breast into a sterile bottle. The pumped breast milk can then be stored in a refrigerator or freezer. Some breast pumps are operated by hand, while others use electricity. Ask your lactation consultant which type will work best for you. Breast pumps can be purchased, but some hospitals and breastfeeding support groups lease breast pumps on a monthly basis. A lactation consultant can teach you how to   hand express breast milk, if you prefer not to use a pump. Caring for your breasts while you breastfeed Nipples can become dry, cracked, and sore while breastfeeding. The following recommendations can help keep your breasts moisturized and healthy:  Avoid using soap on your nipples.  Wear a supportive bra. Although not required, special nursing bras and tank tops are designed to allow access to your breasts for breastfeeding without taking off your entire bra or top. Avoid wearing underwire-style bras or extremely tight bras.  Air dry your nipples for 3-4minutes after each feeding.  Use only cotton bra pads to absorb leaked breast milk. Leaking of breast milk between feedings is normal.  Use lanolin on your nipples after breastfeeding. Lanolin helps to maintain your skin's normal moisture barrier. If you use pure lanolin, you do not need to wash it off  before feeding your baby again. Pure lanolin is not toxic to your baby. You may also hand express a few drops of breast milk and gently massage that milk into your nipples and allow the milk to air dry. In the first few weeks after giving birth, some women experience extremely full breasts (engorgement). Engorgement can make your breasts feel heavy, warm, and tender to the touch. Engorgement peaks within 3-5 days after you give birth. The following recommendations can help ease engorgement:  Completely empty your breasts while breastfeeding or pumping. You may want to start by applying warm, moist heat (in the shower or with warm water-soaked hand towels) just before feeding or pumping. This increases circulation and helps the milk flow. If your baby does not completely empty your breasts while breastfeeding, pump any extra milk after he or she is finished.  Wear a snug bra (nursing or regular) or tank top for 1-2 days to signal your body to slightly decrease milk production.  Apply ice packs to your breasts, unless this is too uncomfortable for you.  Make sure that your baby is latched on and positioned properly while breastfeeding. If engorgement persists after 48 hours of following these recommendations, contact your health care provider or a lactation consultant. Overall health care recommendations while breastfeeding  Eat healthy foods. Alternate between meals and snacks, eating 3 of each per day. Because what you eat affects your breast milk, some of the foods may make your baby more irritable than usual. Avoid eating these foods if you are sure that they are negatively affecting your baby.  Drink milk, fruit juice, and water to satisfy your thirst (about 10 glasses a day).  Rest often, relax, and continue to take your prenatal vitamins to prevent fatigue, stress, and anemia.  Continue breast self-awareness checks.  Avoid chewing and smoking tobacco. Chemicals from cigarettes that pass  into breast milk and exposure to secondhand smoke may harm your baby.  Avoid alcohol and drug use, including marijuana. Some medicines that may be harmful to your baby can pass through breast milk. It is important to ask your health care provider before taking any medicine, including all over-the-counter and prescription medicine as well as vitamin and herbal supplements. It is possible to become pregnant while breastfeeding. If birth control is desired, ask your health care provider about options that will be safe for your baby. Contact a health care provider if:  You feel like you want to stop breastfeeding or have become frustrated with breastfeeding.  You have painful breasts or nipples.  Your nipples are cracked or bleeding.  Your breasts are red, tender, or warm.  You have   a swollen area on either breast.  You have a fever or chills.  You have nausea or vomiting.  You have drainage other than breast milk from your nipples.  Your breasts do not become full before feedings by the fifth day after you give birth.  You feel sad and depressed.  Your baby is too sleepy to eat well.  Your baby is having trouble sleeping.  Your baby is wetting less than 3 diapers in a 24-hour period.  Your baby has less than 3 stools in a 24-hour period.  Your baby's skin or the white part of his or her eyes becomes yellow.  Your baby is not gaining weight by 5 days of age. Get help right away if:  Your baby is overly tired (lethargic) and does not want to wake up and feed.  Your baby develops an unexplained fever. This information is not intended to replace advice given to you by your health care provider. Make sure you discuss any questions you have with your health care provider. Document Released: 11/01/2005 Document Revised: 04/14/2016 Document Reviewed: 04/25/2013 Elsevier Interactive Patient Education  2017 Elsevier Inc.  

## 2016-12-15 NOTE — Progress Notes (Signed)
   PRENATAL VISIT NOTE  Subjective:  Michele Maldonado is a 31 y.o. [id number] at 1988w2d being seen today for ongoing prenatal care.  She is currently monitored for the following issues for this low-risk pregnancy and has Thrombocytopenia (HCC); Previous cesarean delivery, antepartum; Supervision of normal pregnancy, antepartum; Marijuana abuse; and History of nephrotic syndrome on her problem list.  Patient reports has some mild right labial swelling, It is not worsening..  Contractions: Not present. Vag. Bleeding: None.  Movement: Present. Denies leaking of fluid.   The following portions of the patient's history were reviewed and updated as appropriate: allergies, current medications, past family history, past medical history, past social history, past surgical history and problem list. Problem list updated.  Objective:   Vitals:   12/15/16 1254  BP: 118/74  Pulse: (!) 104  Weight: 193 lb 9.6 oz (87.8 kg)    Fetal Status: Fetal Heart Rate (bpm): 134 Fundal Height: 35 cm Movement: Present  Presentation: Vertex  General:  Alert, oriented and cooperative. Patient is in no acute distress.  Skin: Skin is warm and dry. No rash noted.   Cardiovascular: Normal heart rate noted  Respiratory: Normal respiratory effort, no problems with respiration noted  Abdomen: Soft, gravid, appropriate for gestational age. Pain/Pressure: Present     Pelvic:  Cervical exam deferred        Extremities: Normal range of motion.  Edema: None  Mental Status: Normal mood and affect. Normal behavior. Normal judgment and thought content.   Assessment and Plan:  Pregnancy: [id number] at 8288w2d  1. Supervision of other normal pregnancy, antepartum Reports MD in [place] told her she had borderline Poly--check u/s ? If she will develop massive swelling this pregnancy--last was delivered at 33 wks due to nephrotic syndrome. Hopefully this will not come back again. - US MFM OB FOLLOW UP; Future  2. Previous cesarean delivery,  antepartum TOLAC consent signed today   Preterm labor symptoms and general obstetric precautions including but not limited to vaginal bleeding, contractions, leaking of fluid and fetal movement were reviewed in detail with the patient. Please refer to After Visit Summary for other counseling recommendations.  Return in 1 week (on 12/22/2016).   Reva Boresanya S Audley Hinojos, MD

## 2016-12-22 ENCOUNTER — Encounter: Payer: Medicaid Other | Admitting: Obstetrics and Gynecology

## 2016-12-24 ENCOUNTER — Other Ambulatory Visit: Payer: Self-pay | Admitting: Family Medicine

## 2016-12-24 ENCOUNTER — Ambulatory Visit (HOSPITAL_COMMUNITY)
Admission: RE | Admit: 2016-12-24 | Discharge: 2016-12-24 | Disposition: A | Discharge status: Home / Self Care | Payer: Medicaid Other | Source: Ambulatory Visit | Attending: Family Medicine | Admitting: Family Medicine

## 2016-12-24 DIAGNOSIS — Z362 Encounter for other antenatal screening follow-up: Secondary | ICD-10-CM

## 2016-12-24 DIAGNOSIS — O34219 Maternal care for unspecified type scar from previous cesarean delivery: Secondary | ICD-10-CM | POA: Diagnosis not present

## 2016-12-24 DIAGNOSIS — Z3480 Encounter for supervision of other normal pregnancy, unspecified trimester: Secondary | ICD-10-CM

## 2016-12-24 DIAGNOSIS — Z3A36 36 weeks gestation of pregnancy: Secondary | ICD-10-CM | POA: Diagnosis not present

## 2016-12-26 ENCOUNTER — Encounter: Payer: Self-pay | Admitting: Obstetrics and Gynecology

## 2016-12-29 ENCOUNTER — Encounter: Payer: Medicaid Other | Admitting: Family Medicine

## 2016-12-30 ENCOUNTER — Telehealth: Payer: Self-pay

## 2016-12-30 ENCOUNTER — Encounter: Payer: Self-pay | Admitting: Obstetrics & Gynecology

## 2016-12-30 NOTE — Telephone Encounter (Signed)
Pt called requesting that the results of her ultrasound be explained.

## 2017-01-02 ENCOUNTER — Encounter: Payer: Self-pay | Admitting: Obstetrics and Gynecology

## 2017-01-04 NOTE — Telephone Encounter (Signed)
Pt had also sent message yesterday via My Chart requesting further clarification of her ultrasound due date. I responded to her message yesterday via My Chart and provided the requested information. Per chart review, pt read the message @ 1807 last evening.

## 2017-01-05 ENCOUNTER — Ambulatory Visit (INDEPENDENT_AMBULATORY_CARE_PROVIDER_SITE_OTHER): Payer: Medicaid Other | Admitting: Obstetrics and Gynecology

## 2017-01-05 ENCOUNTER — Other Ambulatory Visit (HOSPITAL_COMMUNITY)
Admission: RE | Admit: 2017-01-05 | Discharge: 2017-01-05 | Disposition: A | Discharge status: Home / Self Care | Payer: Medicaid Other | Source: Ambulatory Visit | Attending: Obstetrics and Gynecology | Admitting: Obstetrics and Gynecology

## 2017-01-05 VITALS — BP 120/71 | HR 91

## 2017-01-05 DIAGNOSIS — Z3483 Encounter for supervision of other normal pregnancy, third trimester: Secondary | ICD-10-CM

## 2017-01-05 DIAGNOSIS — Z113 Encounter for screening for infections with a predominantly sexual mode of transmission: Secondary | ICD-10-CM | POA: Diagnosis present

## 2017-01-05 DIAGNOSIS — O34219 Maternal care for unspecified type scar from previous cesarean delivery: Secondary | ICD-10-CM

## 2017-01-05 DIAGNOSIS — Z3480 Encounter for supervision of other normal pregnancy, unspecified trimester: Secondary | ICD-10-CM

## 2017-01-05 NOTE — Progress Notes (Signed)
   PRENATAL VISIT NOTE  Subjective:  Michele Maldonado is a 31 y.o. [id number] at 2829w2d being seen today for ongoing prenatal care.  She is currently monitored for the following issues for this low-risk pregnancy and has Thrombocytopenia (HCC); Previous cesarean delivery, antepartum; Supervision of normal pregnancy, antepartum; Marijuana abuse; and History of nephrotic syndrome on her problem list.  Patient reports no complaints.  Contractions: Not present. Vag. Bleeding: None.  Movement: Present. Denies leaking of fluid.   The following portions of the patient's history were reviewed and updated as appropriate: allergies, current medications, past family history, past medical history, past social history, past surgical history and problem list. Problem list updated.  Objective:   Vitals:   01/05/17 1247  BP: 120/71  Pulse: 91    Fetal Status: Fetal Heart Rate (bpm): 132 Fundal Height: 38 cm Movement: Present  Presentation: Vertex  General:  Alert, oriented and cooperative. Patient is in no acute distress.  Skin: Skin is warm and dry. No rash noted.   Cardiovascular: Normal heart rate noted  Respiratory: Normal respiratory effort, no problems with respiration noted  Abdomen: Soft, gravid, appropriate for gestational age. Pain/Pressure: Present     Pelvic:  Cervical exam performed Dilation: 1 Effacement (%): Thick Station: Ballotable  Extremities: Normal range of motion.  Edema: None  Mental Status: Normal mood and affect. Normal behavior. Normal judgment and thought content.   Assessment and Plan:  Pregnancy: [id number] at 6229w2d  1. Supervision of other normal pregnancy, antepartum Patient is doing well without complaints Cultures today Patient desired drug screen today - Drug Screen 8 w/Conf, Ur - GC/Chlamydia probe amp ([facility] - Culture, beta strep (group b only)  2. Previous cesarean delivery, antepartum Patient desires TOLAC  Term labor symptoms and general  obstetric precautions including but not limited to vaginal bleeding, contractions, leaking of fluid and fetal movement were reviewed in detail with the patient. Please refer to After Visit Summary for other counseling recommendations.  Return in about 1 week (around 01/12/2017) for ROB.   Catalina AntiguaPeggy Latosha Gaylord, MD

## 2017-01-06 LAB — DRUG SCREEN 8 W/CONF, UR
Amphetamines, Urine: NEGATIVE ng/mL
BARBITURATE SCREEN URINE: NEGATIVE ng/mL
BENZODIAZ UR QL: NEGATIVE ng/mL
BUPRENORPHINE, URINE: NEGATIVE ng/mL
CANNABINOID QUANT UR: NEGATIVE ng/mL
Cocaine (Metab.): NEGATIVE ng/mL
Methadone Screen, Urine: NEGATIVE ng/mL
OPIATE SCREEN URINE: NEGATIVE ng/mL

## 2017-01-06 LAB — GC/CHLAMYDIA PROBE AMP (~~LOC~~) NOT AT ARMC
CHLAMYDIA, DNA PROBE: NEGATIVE
Neisseria Gonorrhea: NEGATIVE

## 2017-01-09 LAB — CULTURE, BETA STREP (GROUP B ONLY): Strep Gp B Culture: NEGATIVE

## 2017-01-10 LAB — OB RESULTS CONSOLE GBS: STREP GROUP B AG: NEGATIVE

## 2017-01-12 ENCOUNTER — Encounter: Payer: Medicaid Other | Admitting: Obstetrics and Gynecology

## 2017-01-12 ENCOUNTER — Inpatient Hospital Stay (HOSPITAL_COMMUNITY)
Admission: AD | Admit: 2017-01-12 | Discharge: 2017-01-12 | Disposition: A | Discharge status: Home / Self Care | Payer: Medicaid Other | Source: Ambulatory Visit | Attending: Obstetrics & Gynecology | Admitting: Obstetrics & Gynecology

## 2017-01-12 ENCOUNTER — Encounter (HOSPITAL_COMMUNITY): Payer: Self-pay

## 2017-01-12 DIAGNOSIS — Z3A00 Weeks of gestation of pregnancy not specified: Secondary | ICD-10-CM | POA: Diagnosis not present

## 2017-01-12 DIAGNOSIS — O471 False labor at or after 37 completed weeks of gestation: Secondary | ICD-10-CM | POA: Diagnosis present

## 2017-01-12 DIAGNOSIS — O34219 Maternal care for unspecified type scar from previous cesarean delivery: Secondary | ICD-10-CM

## 2017-01-12 LAB — URINALYSIS, ROUTINE W REFLEX MICROSCOPIC
BILIRUBIN URINE: NEGATIVE
Glucose, UA: NEGATIVE mg/dL
Hgb urine dipstick: NEGATIVE
KETONES UR: NEGATIVE mg/dL
LEUKOCYTES UA: NEGATIVE
NITRITE: NEGATIVE
PH: 6 (ref 5.0–8.0)
Protein, ur: NEGATIVE mg/dL
SPECIFIC GRAVITY, URINE: 1.005 (ref 1.005–1.030)

## 2017-01-12 NOTE — Discharge Instructions (Signed)

## 2017-01-12 NOTE — MAU Note (Signed)
Pt states she has been having abd cramping, back pain and nausea since 0500 today.  Pt missed her appointment today in low risk clinic.  Denies vaginal bleeding or ROM.  Good fetal movement.

## 2017-01-12 NOTE — Progress Notes (Signed)
I have communicated with Dr. Schenk and revieGenevie Annwed vital signs:  Vitals:   01/12/17 1805  BP: 115/76  Resp: 20  Temp: 98.6 F (37 C)    Vaginal exam:  Dilation: 2 Effacement (%): 60 Cervical Position: Middle Station: -3 Presentation: Vertex Exam by:: Guadlupe SpanishL. Taimur Fier RN ,   Also reviewed contraction pattern and that non-stress test is reactive.  It has been documented that patient is having irregular contractions with no cervical change over 1 hours not indicating active labor.  Patient denies any other complaints.  Based on this report provider has given order for discharge.  A discharge diagnosis will be entered by a provider. Labor discharge instructions reviewed with patient.

## 2017-01-13 ENCOUNTER — Encounter: Payer: Self-pay | Admitting: Obstetrics and Gynecology

## 2017-01-17 ENCOUNTER — Encounter (HOSPITAL_COMMUNITY): Payer: Self-pay | Admitting: *Deleted

## 2017-01-17 ENCOUNTER — Inpatient Hospital Stay (HOSPITAL_COMMUNITY): Payer: Medicaid Other

## 2017-01-17 ENCOUNTER — Inpatient Hospital Stay (HOSPITAL_COMMUNITY)
Admission: AD | Admit: 2017-01-17 | Discharge: 2017-01-17 | Disposition: A | Discharge status: Home / Self Care | Payer: Medicaid Other | Source: Ambulatory Visit | Attending: Family Medicine | Admitting: Family Medicine

## 2017-01-17 DIAGNOSIS — Z3A40 40 weeks gestation of pregnancy: Secondary | ICD-10-CM

## 2017-01-17 DIAGNOSIS — O479 False labor, unspecified: Secondary | ICD-10-CM

## 2017-01-17 DIAGNOSIS — O26893 Other specified pregnancy related conditions, third trimester: Secondary | ICD-10-CM | POA: Diagnosis present

## 2017-01-17 DIAGNOSIS — O283 Abnormal ultrasonic finding on antenatal screening of mother: Secondary | ICD-10-CM | POA: Insufficient documentation

## 2017-01-17 DIAGNOSIS — O288 Other abnormal findings on antenatal screening of mother: Secondary | ICD-10-CM

## 2017-01-17 NOTE — MAU Note (Signed)
Pt reports cramping and spotting since 3pm. Denies LOF. +FM. Was 2.5cm last week.

## 2017-01-17 NOTE — MAU Provider Note (Signed)
Chief Complaint:  Labor Eval  Provider saw patient at 2250 hrs   HPI: Michele Maldonado is a 31 y.o. [id number] at 7w0dwho presents to maternity admissions reporting contractions.  After observation, FHR noted to be nonreactive so I was asked to see patient. BPP ordered. She reports good fetal movement, denies LOF, vaginal bleeding, vaginal itching/burning, urinary symptoms, h/a, dizziness, n/v, diarrhea, constipation or fever/chills.  She denies headache, visual changes or RUQ abdominal pain.   Abdominal Pain  This is a new problem. The current episode started today. The onset quality is gradual. The problem occurs intermittently. The problem has been unchanged. The pain is located in the suprapubic region. The pain is mild. The quality of the pain is cramping. The abdominal pain does not radiate. Pertinent negatives include no constipation, diarrhea, dysuria, fever, nausea or vomiting. Nothing aggravates the pain. The pain is relieved by nothing. She has tried nothing for the symptoms.   RN Note: Pt reports cramping and spotting since 3pm. Denies LOF. +FM. Was 2.5cm last week.  Past Medical History: Past Medical History:  Diagnosis Date  . MVP (mitral valve prolapse)     Past obstetric history: OB History  Gravida Para Term Preterm AB Living  5 4 3 1  0 4  SAB TAB Ectopic Multiple Live Births  0 0 0 0 4    # Outcome Date GA Lbr Len/2nd Weight Sex Delivery Anes PTL Lv  5 Current           4 Preterm 12/29/13 [redacted]w[redacted]d  4 lb 5 oz (1.956 kg) M CS-LTranv Spinal  LIV  3 Term 04/07/10 [redacted]w[redacted]d  9 lb (4.082 kg) M Vag-Spont EPI  LIV  2 Term 12/20/08 [redacted]w[redacted]d  8 lb 4 oz (3.742 kg) M Vag-Spont EPI  LIV  1 Term 06/07/06 [redacted]w[redacted]d  7 lb 14 oz (3.572 kg) F Vag-Spont EPI  LIV      Past Surgical History: Past Surgical History:  Procedure Laterality Date  . CESAREAN SECTION N/A 12/29/2013   Procedure: CESAREAN SECTION;  Surgeon: Allie Bossier, MD;  Location: [facility];  Service: Obstetrics;  Laterality: N/A;  .  REPAIR VAGINAL CUFF N/A 12/30/2013   Procedure: REPAIR VAGINAL CUFF;  Surgeon: Allie Bossier, MD;  Location: [facility];  Service: Gynecology;  Laterality: N/A;  Insertion of Bakri Balloon    Family History: Family History  Problem Relation Age of Onset  . Hypertension Maternal Grandmother   . Diabetes Maternal Grandmother     Social History: Social History  Substance Use Topics  . Smoking status: Never Smoker  . Smokeless tobacco: Never Used  . Alcohol use No    Allergies: No Known Allergies  Meds:  Facility-Administered Medications Prior to Admission  Medication Dose Route Frequency Provider Last Rate Last Dose  . Tdap (BOOSTRIX) injection 0.5 mL  0.5 mL Intramuscular Once Aviva Signs, CNM       Prescriptions Prior to Admission  Medication Sig Dispense Refill Last Dose  . Prenatal Vit-Fe Fumarate-FA (PRENATAL MULTIVITAMIN) TABS tablet Take 1 tablet by mouth daily.   01/16/2017 at Unknown time    I have reviewed patient's Past Medical Hx, Surgical Hx, Family Hx, Social Hx, medications and allergies.   ROS:  Review of Systems  Constitutional: Negative for chills and fever.  Respiratory: Negative for shortness of breath.   Gastrointestinal: Positive for abdominal pain. Negative for constipation, diarrhea, nausea and vomiting.  Genitourinary: Negative for dysuria.   Other systems negative  Physical Exam  Patient  Vitals for the past 24 hrs:  BP Temp Temp src Pulse Resp SpO2 Height Weight  01/17/17 2137 124/70 98.9 F (37.2 C) Oral 84 16 100 % 5\' 8"  (1.727 m) 202 lb (91.6 kg)   Constitutional: Well-developed, well-nourished female in no acute distress.  Cardiovascular: normal rate and rhythm Respiratory: normal effort, clear to auscultation bilaterally GI: Abd soft, non-tender, gravid appropriate for gestational age.   No rebound or guarding. MS: Extremities nontender, no edema, normal ROM Neurologic: Alert and oriented x 4.  GU: Neg CVAT.  PELVIC EXAM:  Dilation:  2 Effacement (%): 60 Cervical Position: Middle Station: -3 Presentation: Vertex Exam by:: K.Wilson,RN  FHT:  Baseline 140 , moderate variability, no 15 beat accelerations present, no decelerations Contractions: Irregular    After BPP, FHR is now reactive   Labs: No results found for this or any previous visit (from the past 24 hour(s)). B/POS/-- (09/05 1343)  Imaging:  [place] Mfm Ob Follow Up  Result Date: 12/24/2016 ----------------------------------------------------------------------  OBSTETRICS REPORT                      (Signed Final 12/24/2016 11:28 am) ---------------------------------------------------------------------- Patient Info  ID #:       [id number]                          D.O.B.:  [birth year] (30 yrs)  Name:       Michele Maldonado                 Visit Date: 12/24/2016 10:31 am ---------------------------------------------------------------------- Performed By  Performed By:     Tommie Raymond BS,       Secondary Phy.:   Reva Bores                    RDMS, RVT                                                             MD  Attending:        Candis Shine        Address:          [address]                    MD                                                             [address]                                                             [place], [place]  [address]  Referred By:      [facility]       Location:         [facility] for                    [facility]                    Healthcare  Ref. Address:     [facility]                    [address]                    [place], [place]                    [address] ---------------------------------------------------------------------- Orders   #  Description                                 Code   1  [place] MFM OB FOLLOW UP                         09811.91   ----------------------------------------------------------------------   #  Ordered By               Order #        Accession #    Episode #   1  Tinnie Gens              [id number]      [id number]     [id number]  ---------------------------------------------------------------------- Indications   [redacted] weeks gestation of pregnancy                Z3A.36   Previous cesarean delivery, antepartum         O34.219   Poor obstetric history: Previous preterm       O09.219   delivery, antepartum (33 weeks)   Marijuana Abuse (+ 07/20/16)   Encounter for other antenatal screening        Z36.2   follow-up  ---------------------------------------------------------------------- OB History  Gravidity:    5         Term:   3        Prem:   1        SAB:   0  TOP:          0       Ectopic:  0        Living: 4 ---------------------------------------------------------------------- Fetal Evaluation  Num Of Fetuses:     1  Fetal Heart         129  Rate(bpm):  Cardiac Activity:   Observed  Presentation:       Cephalic  Placenta:           Posterior, above cervical os  P. Cord Insertion:  Previously Visualized  Amniotic Fluid  AFI FV:      Subjectively upper-normal  AFI Sum(cm)     %Tile       Largest Pocket(cm)  15.9  59          6.91  RUQ(cm)       RLQ(cm)       LUQ(cm)        LLQ(cm)  4.84          1.07          3.07           6.91 ---------------------------------------------------------------------- Biometry  BPD:        94  mm     G. Age:  38w 2d         94  %    CI:           79   %    70 - 86                                                          FL/HC:      21.7   %    20.8 - 22.6  HC:      334.4  mm     G. Age:  38w 2d         63  %    HC/AC:      0.90        0.92 - 1.05  AC:      370.4  mm     G. Age:  41w 0d       > 97  %    FL/BPD:     77.0   %    71 - 87  FL:       72.4  mm     G. Age:  37w 1d         60  %    FL/AC:      19.5   %    20 - 24  HUM:        63  mm     G. Age:  36w 4d         66  %  CER:      51.5  mm      G. Age:  N/A          > 95  %  Est. FW:    3812  gm      8 lb 6 oz   > 90  % ---------------------------------------------------------------------- Gestational Age  U/S Today:     38w 5d                                        EDD:   01/02/17  Best:          36w 4d     Det. ByMarcella Dubs         EDD:   01/17/17                                      (05/25/16) ---------------------------------------------------------------------- Anatomy  Cranium:               Appears normal         Aortic Arch:  Appears normal  Cavum:                 Previously seen        Ductal Arch:            Appears normal  Ventricles:            Appears normal         Diaphragm:              Appears normal  Choroid Plexus:        Previously seen        Stomach:                Appears normal, left                                                                        sided  Cerebellum:            Previously seen        Abdomen:                Previously seen  Posterior Fossa:       Previously seen        Abdominal Wall:         Previously seen  Nuchal Fold:           Previously seen        Cord Vessels:           Previously seen  Face:                  Orbits and profile     Kidneys:                Appear normal                         previously seen  Lips:                  Previously seen        Bladder:                Appears normal  Thoracic:              Previously seen        Spine:                  Limited views                                                                        appear normal  Heart:                 Appears normal         Upper Extremities:      Previously seen                         (4CH, axis, and situs  RVOT:  Appears normal         Lower Extremities:      Previously seen  LVOT:                  Appears normal  Other:  Female gender previously seen. Heels previously seen. Open hands          visualized previously.  Nasal bone previously seen.  Technically          difficult due to  advanced GA and fetal position. ---------------------------------------------------------------------- Cervix Uterus Adnexa  Cervix  Not visualized (advanced GA >29wks)  Uterus  No abnormality visualized.  Left Ovary  Size(cm)     3.69   x   2.24   x  1.42      Vol(ml): 6.1  Within normal limits. No adnexal mass visualized.  Right Ovary  Size(cm)     2.93   x   1.92   x  2.72      Vol(ml): 8  Within normal limits. No adnexal mass visualized.  Cul De Sac:   No free fluid seen.  Adnexa:       No abnormality visualized. ---------------------------------------------------------------------- Impression  Single IUP at 36w 4d  Somewhat limited views of the fetal spine obtained due to  fetal position and late gestational age - appears normal  The estimated fetal weight is > 90th %tile (3812 g).  [facility]  measures > 97th %tile  Posterior placenta without previa  Normal amniotic fluid volume ---------------------------------------------------------------------- Recommendations  Follow-up ultrasounds as clinically indicated. ----------------------------------------------------------------------                Candis ShinePaul W Whitecar, MD Electronically Signed Final Report   12/24/2016 11:28 am ----------------------------------------------------------------------   MAU Course/MDM: I have ordered labs and reviewed results.  NST reviewed Nonreactive by criteria so BPP ordered which was scored 8/8   Assessment: 1. NST (non-stress test) nonreactive   2. [redacted] weeks gestation of pregnancy   3.     Reassuring Biophysical Profile  Plan: Discharge home Labor precautions and fetal kick counts Follow up in Office for prenatal visits and recheck of status  Encouraged to return here or to other Urgent Care/ED if she develops worsening of symptoms, increase in pain, fever, or other concerning symptoms.   Pt stable at time of discharge.  Wynelle BourgeoisMarie Briasia Flinders CNM, MSN Certified Nurse-Midwife 01/17/2017 11:21 PM

## 2017-01-17 NOTE — Discharge Instructions (Signed)

## 2017-01-18 ENCOUNTER — Ambulatory Visit (INDEPENDENT_AMBULATORY_CARE_PROVIDER_SITE_OTHER): Payer: Medicaid Other | Admitting: Obstetrics & Gynecology

## 2017-01-18 ENCOUNTER — Telehealth (HOSPITAL_COMMUNITY): Payer: Self-pay | Admitting: *Deleted

## 2017-01-18 ENCOUNTER — Encounter: Payer: Medicaid Other | Admitting: Obstetrics and Gynecology

## 2017-01-18 ENCOUNTER — Encounter (HOSPITAL_COMMUNITY): Payer: Self-pay | Admitting: *Deleted

## 2017-01-18 ENCOUNTER — Ambulatory Visit (INDEPENDENT_AMBULATORY_CARE_PROVIDER_SITE_OTHER): Payer: Self-pay | Admitting: Clinical

## 2017-01-18 VITALS — BP 116/67 | HR 87 | Wt 201.1 lb

## 2017-01-18 DIAGNOSIS — F329 Major depressive disorder, single episode, unspecified: Secondary | ICD-10-CM

## 2017-01-18 DIAGNOSIS — F4323 Adjustment disorder with mixed anxiety and depressed mood: Secondary | ICD-10-CM

## 2017-01-18 DIAGNOSIS — O99343 Other mental disorders complicating pregnancy, third trimester: Secondary | ICD-10-CM

## 2017-01-18 DIAGNOSIS — F32A Depression, unspecified: Secondary | ICD-10-CM

## 2017-01-18 DIAGNOSIS — O99340 Other mental disorders complicating pregnancy, unspecified trimester: Principal | ICD-10-CM

## 2017-01-18 LAB — POCT URINALYSIS DIP (DEVICE)
BILIRUBIN URINE: NEGATIVE
Glucose, UA: NEGATIVE mg/dL
Ketones, ur: NEGATIVE mg/dL
Leukocytes, UA: NEGATIVE
NITRITE: NEGATIVE
PH: 5.5 (ref 5.0–8.0)
Protein, ur: NEGATIVE mg/dL
SPECIFIC GRAVITY, URINE: 1.01 (ref 1.005–1.030)
Urobilinogen, UA: 0.2 mg/dL (ref 0.0–1.0)

## 2017-01-18 NOTE — Progress Notes (Signed)
PHQ 9=11, Dr Debroah LoopArnold aware, Asher MuirJamie notified

## 2017-01-18 NOTE — BH Specialist Note (Addendum)
Session Start time: 12:56   End Time: 1:14 Total Time:  18 minutes Type of Service: Behavioral Health - Individual/Family Interpreter: No.   Interpreter Name & Language: n/a # [facility]  SUBJECTIVE: Michele Maldonado is a 31 y.o. female  Pt. was referred by Dr Debroah LoopArnold for:  anxiety and depression. Pt. reports the following symptoms/concerns: Pt states that her only concern today is wanting baby to be born asap, and attributes symptoms of anxiety/depression to being post-due date; pt says she has never experienced postpartum depression with older children, and looks forward to her family from [place] coming to [place] to visit after baby is born.  Duration of problem:  Today Severity: moderate Previous treatment: none  OBJECTIVE: Mood: Appropriate & Affect: Appropriate Risk of harm to self or others: No known risk of harm to self or others Assessments administered: PHQ9: 12/ GAD7: 11  LIFE CONTEXT:  Family & Social: Four children, ages 5510, 457, 486, and 3; family and friends supportive,mother and sisters will visit and help postpartum from [place] [employer]/ Work: Undetermined Self-Care: Mild sleep discomfort, but no issues with self-care, time with friends and family as self-care Life changes: Current pregnancy  What is important to pt/family (values): Upcoming birth, and time with family  GOALS ADDRESSED:  -Reduce symptoms of anxiety and depression  INTERVENTIONS: Motivational Interviewing   ASSESSMENT:  Pt currently experiencing Adjustment disorder with mixed anxious and depressed mood.  Pt may benefit from psychoeducation and brief therapeutic intervention regarding coping with symptoms of anxiety and depression.   PLAN: 1. F/U with behavioral health clinician: Six weeks, or as needed, if symptoms do not decrease 2. Behavioral Health meds: none 3. Behavioral recommendations:  -[facility] T[facility] 10[facility]  -Consider  sleep app on tablet for improved family sleep in next week and postpartum  -Read educational material regarding coping with symptoms of anxiety and depression 4. Referral: Brief Counseling/Psychotherapy and Psychoeducation 5. From scale of 1-10, how likely are you to follow plan: 9  Woc-Behavioral Health Clinician  Behavioral Health Clinician  Marlon PelWarmhandoff:   Warm Hand Off Completed.        [facility] 01/18/2017 01/05/2017 12/15/2016 10/26/2016 09/16/2016  Decreased Interest 3 2 1 1  0  Down, Depressed, Hopeless 1 0 0 0 0  PHQ - 2 Score 4 2 1 1  0  Altered sleeping 3 3 1 2 2   Tired, decreased energy 3 2 1 1 2   Change in appetite 1 0 0 0 0  Feeling bad or failure about yourself  0 0 0 0 0  Trouble concentrating 0 0 0 0 0  Moving slowly or fidgety/restless 1 0 0 0 0  Suicidal thoughts 0 0 0 0 0  PHQ-9 Score 12 7 3 4 4    GAD 7 : Generalized Anxiety Score 01/18/2017 01/05/2017 12/15/2016 10/26/2016  Nervous, Anxious, on Edge 3 2 0 0  Control/stop worrying 1 0 0 0  Worry too much - different things 1 0 0 0  Trouble relaxing 3 2 1 1   Restless 0 2 0 0  Easily annoyed or irritable 3 0 1 1  Afraid - awful might happen 0 0 0 0  Total GAD 7 Score 11 6 2  2

## 2017-01-18 NOTE — Telephone Encounter (Signed)
Preadmission screen  

## 2017-01-18 NOTE — Patient Instructions (Signed)
Labor Induction Labor induction is when steps are taken to cause a pregnant woman to begin the labor process. Most women go into labor on their own between 37 weeks and 42 weeks of the pregnancy. When this does not happen or when there is a medical need, methods may be used to induce labor. Labor induction causes a pregnant woman's uterus to contract. It also causes the cervix to soften (ripen), open (dilate), and thin out (efface). Usually, labor is not induced before 39 weeks of the pregnancy unless there is a problem with the baby or mother. Before inducing labor, your health care provider will consider a number of factors, including the following:  The medical condition of you and the baby.  How many weeks along you are.  The status of the baby's lung maturity.  The condition of the cervix.  The position of the baby. What are the reasons for labor induction? Labor may be induced for the following reasons:  The health of the baby or mother is at risk.  The pregnancy is overdue by 1 week or more.  The water breaks but labor does not start on its own.  The mother has a health condition or serious illness, such as high blood pressure, infection, placental abruption, or diabetes.  The amniotic fluid amounts are low around the baby.  The baby is distressed. Convenience or wanting the baby to be born on a certain date is not a reason for inducing labor. What methods are used for labor induction? Several methods of labor induction may be used, such as:  Prostaglandin medicine. This medicine causes the cervix to dilate and ripen. The medicine will also start contractions. It can be taken by mouth or by inserting a suppository into the vagina.  Inserting a thin tube (catheter) with a balloon on the end into the vagina to dilate the cervix. Once inserted, the balloon is expanded with water, which causes the cervix to open.  Stripping the membranes. Your health care provider separates  amniotic sac tissue from the cervix, causing the cervix to be stretched and causing the release of a hormone called progesterone. This may cause the uterus to contract. It is often done during an office visit. You will be sent home to wait for the contractions to begin. You will then come in for an induction.  Breaking the water. Your health care provider makes a hole in the amniotic sac using a small instrument. Once the amniotic sac breaks, contractions should begin. This may still take hours to see an effect.  Medicine to trigger or strengthen contractions. This medicine is given through an IV access tube inserted into a vein in your arm. All of the methods of induction, besides stripping the membranes, will be done in the hospital. Induction is done in the hospital so that you and the baby can be carefully monitored. How long does it take for labor to be induced? Some inductions can take up to 2-3 days. Depending on the cervix, it usually takes less time. It takes longer when you are induced early in the pregnancy or if this is your first pregnancy. If a mother is still pregnant and the induction has been going on for 2-3 days, either the mother will be sent home or a cesarean delivery will be needed. What are the risks associated with labor induction? Some of the risks of induction include:  Changes in fetal heart rate, such as too high, too low, or erratic.  Fetal distress.    Chance of infection for the mother and baby.  Increased chance of having a cesarean delivery.  Breaking off (abruption) of the placenta from the uterus (rare).  Uterine rupture (very rare). When induction is needed for medical reasons, the benefits of induction may outweigh the risks. What are some reasons for not inducing labor? Labor induction should not be done if:  It is shown that your baby does not tolerate labor.  You have had previous surgeries on your uterus, such as a myomectomy or the removal of  fibroids.  Your placenta lies very low in the uterus and blocks the opening of the cervix (placenta previa).  Your baby is not in a head-down position.  The umbilical cord drops down into the birth canal in front of the baby. This could cut off the baby's blood and oxygen supply.  You have had a previous cesarean delivery.  There are unusual circumstances, such as the baby being extremely premature. This information is not intended to replace advice given to you by your health care provider. Make sure you discuss any questions you have with your health care provider. Document Released: 03/23/2007 Document Revised: 04/08/2016 Document Reviewed: 05/31/2013 Elsevier Interactive Patient Education  2017 Elsevier Inc.  

## 2017-01-18 NOTE — Progress Notes (Signed)
   PRENATAL VISIT NOTE  Subjective:  Michele Maldonado is a 31 y.o. [id number] at 6653w1d being seen today for ongoing prenatal care.  She is currently monitored for the following issues for this low-risk pregnancy and has Thrombocytopenia (HCC); Previous cesarean delivery, antepartum; Supervision of normal pregnancy, antepartum; Marijuana abuse; and History of nephrotic syndrome on her problem list.  Patient reports no complaints.  Contractions: Irregular. Vag. Bleeding: None.  Movement: Present. Denies leaking of fluid.   The following portions of the patient's history were reviewed and updated as appropriate: allergies, current medications, past family history, past medical history, past social history, past surgical history and problem list. Problem list updated.  Objective:   Vitals:   01/18/17 1243  BP: 116/67  Pulse: 87  Weight: 201 lb 1.6 oz (91.2 kg)    Fetal Status: Fetal Heart Rate (bpm): 130   Movement: Present     General:  Alert, oriented and cooperative. Patient is in no acute distress.  Skin: Skin is warm and dry. No rash noted.   Cardiovascular: Normal heart rate noted  Respiratory: Normal respiratory effort, no problems with respiration noted  Abdomen: Soft, gravid, appropriate for gestational age. Pain/Pressure: Present     Pelvic:  Cervical exam deferred        Extremities: Normal range of motion.  Edema: Trace  Mental Status: Normal mood and affect. Normal behavior. Normal judgment and thought content.   Assessment and Plan:  Pregnancy: [id number] at 6653w1d  1. Depression affecting pregnancy See Reather LittlerJamie McManus - Ambulatory referral to [facility]  Term labor symptoms and general obstetric precautions including but not limited to vaginal bleeding, contractions, leaking of fluid and fetal movement were reviewed in detail with the patient. Please refer to After Visit Summary for other counseling recommendations.  Return in about 4 days (around 01/22/2017)  for NST AFI.   Adam PhenixJames G Arnold, MD

## 2017-01-19 ENCOUNTER — Other Ambulatory Visit: Payer: Self-pay | Admitting: Advanced Practice Midwife

## 2017-01-19 NOTE — Progress Notes (Signed)
error 

## 2017-01-20 ENCOUNTER — Encounter (HOSPITAL_COMMUNITY): Payer: Self-pay | Admitting: *Deleted

## 2017-01-20 ENCOUNTER — Inpatient Hospital Stay (HOSPITAL_COMMUNITY)
Admission: AD | Admit: 2017-01-20 | Discharge: 2017-01-22 | DRG: 774 | Disposition: A | Discharge status: Home / Self Care | Payer: Medicaid Other | Source: Ambulatory Visit | Attending: Obstetrics and Gynecology | Admitting: Obstetrics and Gynecology

## 2017-01-20 ENCOUNTER — Inpatient Hospital Stay (HOSPITAL_COMMUNITY): Payer: Medicaid Other | Admitting: Anesthesiology

## 2017-01-20 DIAGNOSIS — Z8249 Family history of ischemic heart disease and other diseases of the circulatory system: Secondary | ICD-10-CM | POA: Diagnosis not present

## 2017-01-20 DIAGNOSIS — O9912 Other diseases of the blood and blood-forming organs and certain disorders involving the immune mechanism complicating childbirth: Secondary | ICD-10-CM | POA: Diagnosis present

## 2017-01-20 DIAGNOSIS — O34211 Maternal care for low transverse scar from previous cesarean delivery: Secondary | ICD-10-CM | POA: Diagnosis present

## 2017-01-20 DIAGNOSIS — O9942 Diseases of the circulatory system complicating childbirth: Secondary | ICD-10-CM | POA: Diagnosis present

## 2017-01-20 DIAGNOSIS — O99324 Drug use complicating childbirth: Secondary | ICD-10-CM | POA: Diagnosis present

## 2017-01-20 DIAGNOSIS — F329 Major depressive disorder, single episode, unspecified: Secondary | ICD-10-CM | POA: Diagnosis present

## 2017-01-20 DIAGNOSIS — F1290 Cannabis use, unspecified, uncomplicated: Secondary | ICD-10-CM | POA: Diagnosis present

## 2017-01-20 DIAGNOSIS — Z3493 Encounter for supervision of normal pregnancy, unspecified, third trimester: Secondary | ICD-10-CM | POA: Diagnosis present

## 2017-01-20 DIAGNOSIS — D696 Thrombocytopenia, unspecified: Secondary | ICD-10-CM | POA: Diagnosis present

## 2017-01-20 DIAGNOSIS — O4202 Full-term premature rupture of membranes, onset of labor within 24 hours of rupture: Secondary | ICD-10-CM | POA: Diagnosis present

## 2017-01-20 DIAGNOSIS — Z833 Family history of diabetes mellitus: Secondary | ICD-10-CM

## 2017-01-20 DIAGNOSIS — Z3A40 40 weeks gestation of pregnancy: Secondary | ICD-10-CM

## 2017-01-20 DIAGNOSIS — O34219 Maternal care for unspecified type scar from previous cesarean delivery: Secondary | ICD-10-CM

## 2017-01-20 DIAGNOSIS — O480 Post-term pregnancy: Secondary | ICD-10-CM | POA: Diagnosis present

## 2017-01-20 DIAGNOSIS — I341 Nonrheumatic mitral (valve) prolapse: Secondary | ICD-10-CM | POA: Diagnosis present

## 2017-01-20 DIAGNOSIS — O99344 Other mental disorders complicating childbirth: Secondary | ICD-10-CM | POA: Diagnosis present

## 2017-01-20 DIAGNOSIS — Z3A41 41 weeks gestation of pregnancy: Secondary | ICD-10-CM

## 2017-01-20 LAB — CBC WITH DIFFERENTIAL/PLATELET
BASOS ABS: 0 10*3/uL (ref 0.0–0.1)
Basophils Relative: 0 %
EOS ABS: 0 10*3/uL (ref 0.0–0.7)
EOS PCT: 0 %
HCT: 38.7 % (ref 36.0–46.0)
Hemoglobin: 13.1 g/dL (ref 12.0–15.0)
LYMPHS ABS: 1.8 10*3/uL (ref 0.7–4.0)
Lymphocytes Relative: 12 %
MCH: 31.5 pg (ref 26.0–34.0)
MCHC: 33.9 g/dL (ref 30.0–36.0)
MCV: 93 fL (ref 78.0–100.0)
MONO ABS: 0.6 10*3/uL (ref 0.1–1.0)
Monocytes Relative: 4 %
Neutro Abs: 12.4 10*3/uL — ABNORMAL HIGH (ref 1.7–7.7)
Neutrophils Relative %: 84 %
PLATELETS: 114 10*3/uL — AB (ref 150–400)
RBC: 4.16 MIL/uL (ref 3.87–5.11)
RDW: 14.8 % (ref 11.5–15.5)
WBC: 14.8 10*3/uL — ABNORMAL HIGH (ref 4.0–10.5)

## 2017-01-20 LAB — CBC
HEMATOCRIT: 36.6 % (ref 36.0–46.0)
HEMOGLOBIN: 12.5 g/dL (ref 12.0–15.0)
MCH: 31.4 pg (ref 26.0–34.0)
MCHC: 34.2 g/dL (ref 30.0–36.0)
MCV: 92 fL (ref 78.0–100.0)
Platelets: 115 10*3/uL — ABNORMAL LOW (ref 150–400)
RBC: 3.98 MIL/uL (ref 3.87–5.11)
RDW: 14.7 % (ref 11.5–15.5)
WBC: 10.5 10*3/uL (ref 4.0–10.5)

## 2017-01-20 LAB — RAPID URINE DRUG SCREEN, HOSP PERFORMED
Amphetamines: NOT DETECTED
BARBITURATES: NOT DETECTED
BENZODIAZEPINES: NOT DETECTED
COCAINE: NOT DETECTED
Opiates: NOT DETECTED
Tetrahydrocannabinol: NOT DETECTED

## 2017-01-20 LAB — URINALYSIS, ROUTINE W REFLEX MICROSCOPIC
BILIRUBIN URINE: NEGATIVE
Glucose, UA: NEGATIVE mg/dL
KETONES UR: NEGATIVE mg/dL
LEUKOCYTES UA: NEGATIVE
NITRITE: NEGATIVE
PH: 6 (ref 5.0–8.0)
Protein, ur: NEGATIVE mg/dL
Specific Gravity, Urine: 1.006 (ref 1.005–1.030)

## 2017-01-20 LAB — TYPE AND SCREEN
ABO/RH(D): B POS
ANTIBODY SCREEN: NEGATIVE

## 2017-01-20 LAB — POCT FERN TEST

## 2017-01-20 MED ORDER — FENTANYL 2.5 MCG/ML BUPIVACAINE 1/10 % EPIDURAL INFUSION (WH - ANES)
14.0000 mL/h | INTRAMUSCULAR | Status: DC | PRN
  Administered 2017-01-20: 14 mL/h via EPIDURAL
  Filled 2017-01-20: qty 100

## 2017-01-20 MED ORDER — DIPHENHYDRAMINE HCL 25 MG PO CAPS: 25.0000 mg | ORAL_CAPSULE | Freq: Four times a day (QID) | ORAL | Status: DC | PRN

## 2017-01-20 MED ORDER — LACTATED RINGERS IV SOLN: 500.0000 mL | INTRAVENOUS | Status: DC | PRN

## 2017-01-20 MED ORDER — OXYCODONE-ACETAMINOPHEN 5-325 MG PO TABS: 2.0000 | ORAL_TABLET | ORAL | Status: DC | PRN

## 2017-01-20 MED ORDER — PHENYLEPHRINE 40 MCG/ML (10ML) SYRINGE FOR IV PUSH (FOR BLOOD PRESSURE SUPPORT)
80.0000 ug | PREFILLED_SYRINGE | INTRAVENOUS | Status: DC | PRN
  Filled 2017-01-20: qty 5

## 2017-01-20 MED ORDER — ONDANSETRON HCL 4 MG/2ML IJ SOLN: 4.0000 mg | Freq: Four times a day (QID) | INTRAMUSCULAR | Status: DC | PRN

## 2017-01-20 MED ORDER — OXYCODONE-ACETAMINOPHEN 5-325 MG PO TABS: 1.0000 | ORAL_TABLET | ORAL | Status: DC | PRN

## 2017-01-20 MED ORDER — LACTATED RINGERS IV SOLN
500.0000 mL | Freq: Once | INTRAVENOUS | Status: AC
  Administered 2017-01-20: 500 mL via INTRAVENOUS

## 2017-01-20 MED ORDER — PHENYLEPHRINE 40 MCG/ML (10ML) SYRINGE FOR IV PUSH (FOR BLOOD PRESSURE SUPPORT)
80.0000 ug | PREFILLED_SYRINGE | INTRAVENOUS | Status: DC | PRN
  Filled 2017-01-20: qty 5
  Filled 2017-01-20: qty 10

## 2017-01-20 MED ORDER — WITCH HAZEL-GLYCERIN EX PADS: 1.0000 "application " | MEDICATED_PAD | CUTANEOUS | Status: DC | PRN

## 2017-01-20 MED ORDER — IBUPROFEN 600 MG PO TABS
600.0000 mg | ORAL_TABLET | Freq: Four times a day (QID) | ORAL | Status: DC
  Administered 2017-01-20 – 2017-01-22 (×7): 600 mg via ORAL
  Filled 2017-01-20 (×7): qty 1

## 2017-01-20 MED ORDER — SENNOSIDES-DOCUSATE SODIUM 8.6-50 MG PO TABS
2.0000 | ORAL_TABLET | ORAL | Status: DC
  Administered 2017-01-20: 2 via ORAL
  Filled 2017-01-20 (×2): qty 2

## 2017-01-20 MED ORDER — LIDOCAINE HCL (PF) 1 % IJ SOLN
INTRAMUSCULAR | Status: DC | PRN
  Administered 2017-01-20: 6 mL via EPIDURAL
  Administered 2017-01-20: 4 mL

## 2017-01-20 MED ORDER — ZOLPIDEM TARTRATE 5 MG PO TABS: 5.0000 mg | ORAL_TABLET | Freq: Every evening | ORAL | Status: DC | PRN

## 2017-01-20 MED ORDER — SOD CITRATE-CITRIC ACID 500-334 MG/5ML PO SOLN: 30.0000 mL | ORAL | Status: DC | PRN

## 2017-01-20 MED ORDER — OXYTOCIN BOLUS FROM INFUSION
500.0000 mL | Freq: Once | INTRAVENOUS | Status: AC
  Administered 2017-01-20: 500 mL via INTRAVENOUS

## 2017-01-20 MED ORDER — OXYTOCIN 40 UNITS IN LACTATED RINGERS INFUSION - SIMPLE MED: 2.5000 [IU]/h | INTRAVENOUS | Status: DC

## 2017-01-20 MED ORDER — EPHEDRINE 5 MG/ML INJ
10.0000 mg | INTRAVENOUS | Status: DC | PRN
  Filled 2017-01-20: qty 4

## 2017-01-20 MED ORDER — FENTANYL CITRATE (PF) 100 MCG/2ML IJ SOLN: 100.0000 ug | INTRAMUSCULAR | Status: DC | PRN

## 2017-01-20 MED ORDER — DIPHENHYDRAMINE HCL 50 MG/ML IJ SOLN: 12.5000 mg | INTRAMUSCULAR | Status: DC | PRN

## 2017-01-20 MED ORDER — LACTATED RINGERS IV SOLN
INTRAVENOUS | Status: DC
  Administered 2017-01-20: 09:00:00 via INTRAVENOUS

## 2017-01-20 MED ORDER — OXYTOCIN 40 UNITS IN LACTATED RINGERS INFUSION - SIMPLE MED
2.5000 [IU]/h | INTRAVENOUS | Status: DC
  Administered 2017-01-20: 2.5 [IU]/h via INTRAVENOUS
  Filled 2017-01-20: qty 1000

## 2017-01-20 MED ORDER — TETANUS-DIPHTH-ACELL PERTUSSIS 5-2.5-18.5 LF-MCG/0.5 IM SUSP
0.5000 mL | Freq: Once | INTRAMUSCULAR | Status: AC
  Administered 2017-01-22: 0.5 mL via INTRAMUSCULAR
  Filled 2017-01-20: qty 0.5

## 2017-01-20 MED ORDER — ACETAMINOPHEN 325 MG PO TABS: 650.0000 mg | ORAL_TABLET | ORAL | Status: DC | PRN

## 2017-01-20 MED ORDER — COCONUT OIL OIL: 1.0000 "application " | TOPICAL_OIL | Status: DC | PRN

## 2017-01-20 MED ORDER — BENZOCAINE-MENTHOL 20-0.5 % EX AERO: 1.0000 "application " | INHALATION_SPRAY | CUTANEOUS | Status: DC | PRN

## 2017-01-20 MED ORDER — OXYTOCIN BOLUS FROM INFUSION: 500.0000 mL | Freq: Once | INTRAVENOUS | Status: DC

## 2017-01-20 MED ORDER — ONDANSETRON HCL 4 MG/2ML IJ SOLN
4.0000 mg | INTRAMUSCULAR | Status: DC | PRN
Start: 2017-01-20 — End: 2017-01-22

## 2017-01-20 MED ORDER — PRENATAL MULTIVITAMIN CH
1.0000 | ORAL_TABLET | Freq: Every day | ORAL | Status: DC
  Administered 2017-01-21: 1 via ORAL
  Filled 2017-01-20: qty 1

## 2017-01-20 MED ORDER — ONDANSETRON HCL 4 MG PO TABS: 4.0000 mg | ORAL_TABLET | ORAL | Status: DC | PRN

## 2017-01-20 MED ORDER — LIDOCAINE HCL (PF) 1 % IJ SOLN
30.0000 mL | INTRAMUSCULAR | Status: DC | PRN
  Filled 2017-01-20: qty 30

## 2017-01-20 MED ORDER — SIMETHICONE 80 MG PO CHEW: 80.0000 mg | CHEWABLE_TABLET | ORAL | Status: DC | PRN

## 2017-01-20 MED ORDER — LACTATED RINGERS IV SOLN: INTRAVENOUS | Status: DC

## 2017-01-20 MED ORDER — LIDOCAINE HCL (PF) 1 % IJ SOLN: 30.0000 mL | INTRAMUSCULAR | Status: DC | PRN

## 2017-01-20 MED ORDER — DIBUCAINE 1 % RE OINT: 1.0000 "application " | TOPICAL_OINTMENT | RECTAL | Status: DC | PRN

## 2017-01-20 NOTE — Progress Notes (Signed)
Michele Maldonado is a 31 yo female G5P3104 at 3066w3d in SOL PROM today at [id number] Augmentation: none Anesthesia: epidural @1037   S: More comfortable with epidural  O: BP 116/68   Pulse (!) 102   Temp 97.9 F (36.6 C) (Axillary)   Resp 18   Ht 5\' 8"  (1.727 m)   Wt 201 lb (91.2 kg)   LMP 04/12/2016 (Exact Date)   SpO2 100%   BMI 30.56 kg/m   SVE: 8/80/-2 Fetal tracing: baseline 135, moderate variability, +acels, no decels  A progressing  P expect vaginal delivery  Adair LaundryAna Carvalho do Amaral MD PGY1

## 2017-01-20 NOTE — Lactation Note (Signed)
This note was copied from a baby's chart. Lactation Consultation Note  Patient Name: Michele Maldonado Today's Date: 01/20/2017 Reason for consult: Initial assessment   Initial assessment with Exp BF [facility] < [facility] Infant being weighed when I entered room.   Infant returned STS with mom and mom latched her to the breast independently. Mom was able to determine shallow latch and readjusted infant as needed. Mom demonstrated hand expression. Enc mom to feed infant STS 8-12 x in 24 hours at first feeding cues using head and pillow support offering both breast at each feeding. Enc mom to call for assistance as needed. Infant still feeding when LC left room. Feeding log given with instructions for use.  BF Resources handout and LC Brochure given, mom informed of IP/OP Services, BF Support Groups and LC phone #. [facility] and does not have a pump at home. Mom without questions/concerns at this time. Enc mom to call out to desk for feeding assistance as needed.     Maternal Data Formula Feeding for Exclusion: No Has patient been taught Hand Expression?: Yes Does the patient have breastfeeding experience prior to this delivery?: Yes  Feeding Feeding Type: Breast Fed Length of feed: 10 min (still BF when I left the room)  LATCH Score/Interventions Latch: Repeated attempts needed to sustain latch, nipple held in mouth throughout feeding, stimulation needed to elicit sucking reflex. Intervention(s): Adjust position;Assist with latch;Breast massage;Breast compression  Audible Swallowing: A few with stimulation Intervention(s): Alternate breast massage;Hand expression;Skin to skin  Type of Nipple: Everted at rest and after stimulation  Comfort (Breast/Nipple): Soft / non-tender     Hold (Positioning): No assistance needed to correctly position infant at breast. Intervention(s): Breastfeeding basics reviewed;Support Pillows;Position  options;Skin to skin  LATCH Score: 8  Lactation Tools Discussed/Used WIC Program: No (Plans to apply)   Consult Status Consult Status: Follow-up Date: 01/21/17 Follow-up type: In-patient    Silas FloodSharon S Seona Clemenson 01/20/2017, 2:47 PM

## 2017-01-20 NOTE — MAU Note (Signed)
Scheduled for induction on 01-24-17;

## 2017-01-20 NOTE — Anesthesia Procedure Notes (Signed)
Epidural Patient location during procedure: OB  Staffing Anesthesiologist: Latarra Eagleton  Preanesthetic Checklist Completed: patient identified, site marked, surgical consent, pre-op evaluation, timeout performed, IV checked, risks and benefits discussed and monitors and equipment checked  Epidural Patient position: sitting Prep: site prepped and draped and DuraPrep Patient monitoring: continuous pulse ox and blood pressure Approach: midline Location: L3-L4 Injection technique: LOR air  Needle:  Needle type: Tuohy  Needle gauge: 17 G Needle length: 9 cm and 9 Needle insertion depth: 6 cm Catheter type: closed end flexible Catheter size: 19 Gauge Catheter at skin depth: 12 cm Test dose: negative  Assessment Events: blood not aspirated, injection not painful, no injection resistance, negative IV test and no paresthesia  Additional Notes Dosing of Epidural:  1st dose, through catheter .............................................  Xylocaine 40 mg  2nd dose, through catheter, after waiting 3 minutes.........Xylocaine 60 mg    As each dose occurred, patient was free of IV sx; and patient exhibited no evidence of SA injection.  Patient is more comfortable after epidural dosed. Please see RN's note for documentation of vital signs,and FHR which are stable.  Patient reminded not to try to ambulate with numb legs, and that an RN must be present when she attempts to get up.        

## 2017-01-20 NOTE — Anesthesia Postprocedure Evaluation (Signed)
Anesthesia Post Note  Patient: Jelisa Nasby  Procedure(s) Performed: * No procedures listed *  Patient location during evaluation: Mother Baby Anesthesia Type: Epidural Level of consciousness: awake, oriented and awake and alert Pain management: pain level controlled Vital Signs Assessment: post-procedure vital signs reviewed and stable Respiratory status: spontaneous breathing, nonlabored ventilation and respiratory function stable Cardiovascular status: stable Postop Assessment: no headache, no backache, patient able to bend at knees, no signs of nausea or vomiting and adequate PO intake Anesthetic complications: no        Last Vitals:  Vitals:   01/20/17 1537 01/20/17 1600  BP: 125/77 126/76  Pulse: 93 94  Resp:  18  Temp:  36.7 C    Last Pain:  Vitals:   01/20/17 1600  TempSrc: Oral  PainSc: 0-No pain   Pain Goal:                 Steaven Wholey

## 2017-01-20 NOTE — Anesthesia Preprocedure Evaluation (Signed)
Anesthesia Evaluation  Patient identified by MRN, date of birth, ID band Patient awake    Reviewed: Allergy & Precautions, H&P , Patient's Chart, lab work & pertinent test results  Airway Mallampati: II  TM Distance: >3 FB Neck ROM: full    Dental no notable dental hx.    Pulmonary    Pulmonary exam normal breath sounds clear to auscultation       Cardiovascular Exercise Tolerance: Good  Rhythm:regular Rate:Normal     Neuro/Psych    GI/Hepatic   Endo/Other    Renal/GU      Musculoskeletal   Abdominal   Peds  Hematology   Anesthesia Other Findings plts 115 Hx of post partum hemorrhage  Reproductive/Obstetrics                             Anesthesia Physical Anesthesia Plan  ASA: II  Anesthesia Plan: Spinal   Post-op Pain Management:    Induction:   Airway Management Planned:   Additional Equipment:   Intra-op Plan:   Post-operative Plan:   Informed Consent: I have reviewed the patients History and Physical, chart, labs and discussed the procedure including the risks, benefits and alternatives for the proposed anesthesia with the patient or authorized representative who has indicated his/her understanding and acceptance.   Dental Advisory Given  Plan Discussed with: CRNA  Anesthesia Plan Comments: (Lab work and procedure  confirmed with CRNA in room; platelets okay. Discussed spinal anesthetic, and patient consents to the procedure:  included risk of possible headache,backache, failed block, allergic reaction, and nerve injury. This patient was asked if she had any questions or concerns before the procedure started.  )        Anesthesia Quick Evaluation

## 2017-01-20 NOTE — Progress Notes (Signed)
Michele Maldonado is a 31 yo female G5P3104 at 5044w3d in SOL PROM today at [phone] Augmentation: none Anesthesia: epidural @1037   S: More comfortable with epidural. Feeling pressure.  O:BP 118/70   Pulse 84   Temp 97.9 F (36.6 C) (Axillary)   Resp 16   Ht 5\' 8"  (1.727 m)   Wt 201 lb (91.2 kg)   LMP 04/12/2016 (Exact Date)   SpO2 100%   BMI 30.56 kg/m  SVE: 9/70-80/-2 Fetal tracing: baseline 135, reduced variability but now moderate variability, improved, +acels, no decels  A progressing  P expect vaginal delivery  Adair LaundryAna Carvalho do Amaral MD PGY1

## 2017-01-20 NOTE — MAU Note (Signed)
C/o ucs since 0200 this AM; ?SROM @ 0200 also; postdates; G5P4;

## 2017-01-20 NOTE — H&P (Signed)
Michele Maldonado is a 31 y.o. female [phone] presenting at [redacted]w[redacted]d for SOL. She started feeling regular contractions at 0200AM and her membranes spontaneously ruptured at 0836AM (clear fluid). She denies vaginal bleeding. She has had 3 vaginal deliveries and most recently a cesarean for kidney injury on mom (nephrotic sd) with preeclampsia and anasarca. Hx of thrombocitopenia and marijuana use during this pregnancy (07/20/16). Chart review reveals Hx of depression (PHQ9 = 11) during this pregnancy, pt was referred to [facility] on her last app.  Denies gestational diabetes, preeclampsia (states she had some high BP in the beginning of pregnancy - not confirmed on the chart).   EFW 3812g (>90% percentile) at [redacted]w[redacted]d - 12/24/16 BPP 8/8 on 01/17/17   OB History    Gravida Para Term Preterm AB Living   5 4 3 1  0 4   SAB TAB Ectopic Multiple Live Births   0 0 0 0 4     Past Medical History:  Diagnosis Date  . MVP (mitral valve prolapse)   . Preterm labor    Past Surgical History:  Procedure Laterality Date  . CESAREAN SECTION N/A 12/29/2013   Procedure: CESAREAN SECTION;  Surgeon: Allie Bossier, MD;  Location: [facility]S;  Service: Obstetrics;  Laterality: N/A;  . REPAIR VAGINAL CUFF N/A 12/30/2013   Procedure: REPAIR VAGINAL CUFF;  Surgeon: Allie Bossier, MD;  Location: [facility] ORS;  Service: Gynecology;  Laterality: N/A;  Insertion of Bakri Balloon   Family History: family history includes Diabetes in her maternal grandmother; Hypertension in her maternal grandmother. Social History:  reports that she has never smoked. She has never used smokeless tobacco. She reports that she uses drugs, including Marijuana. She reports that she does not drink alcohol.   Clinic  Low Risk Prenatal Labs  Dating  6 wk [place] Blood type: B/POS/-- (09/05 1343)   Genetic Screen  Quad:  Screens Neg                  NIPS: Antibody:NEG (09/05 1343)  Anatomic [place]  Normal except limited spine views, repeat 4 wks Rubella: 3.43 (09/05 1343)  GTT Early:                Third trimester: 60 RPR: NON REAC (09/05 1343)   Flu vaccine 07/20/16 HBsAg: NEGATIVE (09/05 1343)   TDaP vaccine   10/26/16                                           HIV: NONREACTIVE (09/05 1343)   Baby Food    Breast                               GBS: negative   Contraception   Mirena Pap:WNL 07/20/16  Circumcision  Yes   Pediatrician  [facility]   Support Person  Ernst Spell ([facility])        Maternal Diabetes: No Genetic Screening: Normal Maternal Ultrasounds/Referrals: referral to BH (depression) Fetal Ultrasounds or other Referrals:   EFW @36w  was 3800g  Maternal Substance Abuse:  Yes:  Type: Marijuana Significant Maternal Medications:  None Significant Maternal Lab Results:  Lab values include: Other: thrombocytopenia Other Comments:  None  Review of Systems  Constitutional: Negative for fever.  Eyes: Negative for blurred vision and double vision.  Respiratory: Negative for shortness  of breath.   Cardiovascular: Negative for chest pain and leg swelling.  Gastrointestinal: Negative for abdominal pain, nausea and vomiting.  Genitourinary: Negative for dysuria.  Musculoskeletal: Negative for myalgias.  Skin: Negative for rash.  Neurological: Negative for dizziness and headaches.   Maternal Medical History:  Reason for admission: Nausea.    Dilation: 4 Effacement (%): 50 Station: -2 Exam by:: Morrison Oldee Carter RN Blood pressure 115/67, pulse 91, temperature 98.5 F (36.9 C), temperature source Oral, resp. rate 18, height 5\' 8"  (1.727 m), weight 201 lb (91.2 kg), last menstrual period 04/12/2016. Exam Physical Exam  Constitutional: She is oriented to person, place, and time. She appears well-developed and well-nourished.  Appears in pain, having regular contractions  HENT:  Head: Normocephalic and atraumatic.  Mouth/Throat: Oropharynx is clear and moist.  Eyes: Conjunctivae and EOM are normal. Pupils are equal, round, and reactive to light.   Cardiovascular: Normal rate, regular rhythm, normal heart sounds and intact distal pulses.  Exam reveals no gallop and no friction rub.   No murmur heard. Respiratory: Effort normal and breath sounds normal. No respiratory distress. She has no wheezes. She has no rales.  Musculoskeletal: She exhibits no edema.  Neurological: She is alert and oriented to person, place, and time.  Skin: Skin is warm and dry. No rash noted. She is not diaphoretic. No pallor.    Prenatal labs: ABO, Rh: --/--/B POS (03/08 0845) Antibody: PENDING (03/08 0845) Rubella: 3.43 (09/05 1343) RPR: NON REAC (12/12 1425)  HBsAg: NEGATIVE (09/05 1343)  HIV: NONREACTIVE (12/12 1425)  GBS: Negative (02/26 0000)   Assessment/Plan: Admit for SOL Routine labor orders Hx of thc use, UDS today Labs ordered: RPR, CBC, type and screen  #Labor: SOL #Anesthesia: epidural planned #ID: GBS negative #Contraception: IUD #Feeding: Breast   Nehemiah SettleAna Carvalho do Silvio ClaymanAmaral MD PGY1 01/20/2017, 9:34 AM   OB FELLOW HISTORY AND PHYSICAL ATTESTATION  I have seen and examined this patient; I agree with above documentation in the resident's note. Patient is a TOLAC, consent signed on admission.  Trial of Labor after Cesarean Consent  31 y.o. [id number] at 2945w3d with Estimated Date of Delivery: 01/17/17 was seen today in office to discuss trial of labor after cesarean section (TOLAC) versus elective repeat cesarean delivery (ERCD). The following risks were discussed with the patient.   Risk of uterine rupture at term is 0.78 percent with TOLAC and 0.22 percent with ERCD. 1 in 10 uterine ruptures will result in neonatal death or neurological injury. The benefits of a trial of labor after cesarean (TOLAC) resulting in a vaginal birth after cesarean (VBAC) include the following: shorter length of hospital stay and postpartum recovery (in most cases); fewer complications, such as postpartum fever, wound or uterine infection, thromboembolism (blood  clots in the leg or lung), need for blood transfusion and fewer neonatal breathing problems. The risks of an attempted VBAC or TOLAC include the following: Risk of failed trial of labor after cesarean (TOLAC) without a vaginal birth after cesarean (VBAC) resulting in repeat cesarean delivery (RCD) in about 20 to 40 percent of women who attempt VBAC.  Risk of rupture of uterus resulting in an emergency cesarean delivery. The risk of uterine rupture may be related in part to the type of uterine incision made during the first cesarean delivery. A previous transverse uterine incision has the lowest risk of rupture (0.2 to 1.5 percent risk). Vertical or T-shaped uterine incisions have a higher risk of uterine rupture (4 to 9 percent risk)The risk  of fetal death is very low with both VBAC and elective repeat cesarean delivery (ERCD), but the likelihood of fetal death is higher with VBAC than with ERCD. Maternal death is very rare with either type of delivery. The risks of an elective repeat cesarean delivery (ERCD) were reviewed with the patient including but not limited to: 12/998 risk of uterine rupture which could have serious consequences, bleeding which may require transfusion; infection which may require antibiotics; injury to bowel, bladder or other surrounding organs (bowel, bladder, ureters); injury to the fetus; need for additional procedures including hysterectomy in the event of a life-threatening hemorrhage; thromboembolic phenomenon; abnormal placentation; incisional problems; death and other postoperative or anesthesia complications.    These risks and benefits are summarized on the consent form, which was reviewed with the patient during the visit.  All her questions answered and she signed a consent indicating a preference for TOLAC/ERCD. Consent placed in chart.    Jen Mow, DO [place] Fellow 01/20/2017

## 2017-01-20 NOTE — Anesthesia Pain Management Evaluation Note (Signed)
  CRNA Pain Management Visit Note  Patient: Michele Maldonado, 31 y.o., female  "Hello I am a member of the anesthesia team at [facility]. We have an anesthesia team available at all times to provide care throughout the hospital, including epidural management and anesthesia for C-section. I don't know your plan for the delivery whether it a natural birth, water birth, IV sedation, nitrous supplementation, doula or epidural, but we want to meet your pain goals."   1.Was your pain managed to your expectations on prior hospitalizations?   Yes   2.What is your expectation for pain management during this hospitalization?     Epidural  3.How can we help you reach that goal? epidural  Record the patient's initial score and the patient's pain goal.   Pain: 2  Pain Goal: 4 [facility]: [facility] Rhonda W wants you to be able to say your pain was always managed very well.  Shakima Nisley 01/20/2017

## 2017-01-21 ENCOUNTER — Other Ambulatory Visit: Payer: Self-pay | Admitting: Obstetrics and Gynecology

## 2017-01-21 LAB — CBC
HCT: 35 % — ABNORMAL LOW (ref 36.0–46.0)
Hemoglobin: 12 g/dL (ref 12.0–15.0)
MCH: 31.7 pg (ref 26.0–34.0)
MCHC: 34.3 g/dL (ref 30.0–36.0)
MCV: 92.3 fL (ref 78.0–100.0)
PLATELETS: 118 10*3/uL — AB (ref 150–400)
RBC: 3.79 MIL/uL — AB (ref 3.87–5.11)
RDW: 14.7 % (ref 11.5–15.5)
WBC: 12.6 10*3/uL — ABNORMAL HIGH (ref 4.0–10.5)

## 2017-01-21 LAB — RPR: RPR Ser Ql: NONREACTIVE

## 2017-01-21 NOTE — Progress Notes (Signed)
Resident called to notify of no progress note in on patient by OB today.  She informed the RN that she would check on that.

## 2017-01-21 NOTE — Lactation Note (Signed)
This note was copied from a baby's chart. Lactation Consultation Note  Patient Name: Michele Maldonado ZOXWR'UToday's Date: 01/21/2017 Reason for consult: Follow-up assessment   Baby 25 hours old. Mom reports that this is her 5th child and she intends to breast and formula feed as with the first 4 children.  Maternal Data    Feeding Feeding Type: Breast Fed Nipple Type: Slow - flow  LATCH Score/Interventions                      Lactation Tools Discussed/Used     Consult Status Consult Status: Complete    Sherlyn HayJennifer D Dierre Crevier 01/21/2017, 3:17 PM

## 2017-01-21 NOTE — Progress Notes (Signed)
Post Partum Day #1 Subjective: no complaints, up ad lib and tolerating PO; breast and bottlefeeding; plans IUD for contraception PP  Objective: Blood pressure 126/71, pulse 93, temperature 99.7 F (37.6 C), temperature source Oral, resp. rate 18, height 5\' 8"  (1.727 m), weight 91.2 kg (201 lb), last menstrual period 04/12/2016, SpO2 100 %, unknown if currently breastfeeding.  Physical Exam:  General: alert, cooperative and no distress Lochia: appropriate Uterine Fundus: firm DVT Evaluation: No evidence of DVT seen on physical exam.   Recent Labs  01/20/17 1514 01/21/17 0604  HGB 13.1 12.0  HCT 38.7 35.0*   Plts: 118 (was 115) UDS: neg  Assessment/Plan: Plan for discharge tomorrow   LOS: 1 day   Neida Ellegood CNM 01/21/2017, 10:05 PM

## 2017-01-22 MED ORDER — MEDROXYPROGESTERONE ACETATE 150 MG/ML IM SUSP: 150.0000 mg | Freq: Once | INTRAMUSCULAR | Status: DC

## 2017-01-22 MED ORDER — IBUPROFEN 600 MG PO TABS: 600.0000 mg | ORAL_TABLET | Freq: Four times a day (QID) | ORAL | 0 refills | Status: DC

## 2017-01-22 NOTE — Discharge Instructions (Signed)

## 2017-01-22 NOTE — Progress Notes (Signed)
CSW received hand-off from week day CSW Lulu RidingColleen Shaw, who noted [name] needed assessment for hx of substance use. This writer attempted to see [name]; however, [name] was already d/c. Baby's UDS was (-) negative. CSW will continue to follow CDS results and make report for positive results if applicable.   Omair Dettmer, MSW, LCSW-A Clinical Social Worker  [facility]  Office: [phone]

## 2017-01-22 NOTE — Lactation Note (Signed)
This note was copied from a baby's chart. Lactation Consultation Note  P5 mom formula and breastfeeding.  Engorgement care reviewed as well as OP services, support groups, resources for breastfeeding.  Mom states she plans to bf infant along with formula feeding for one month then she plans to quit breastfeeding.  Mom states she never had engorgement issues or mastitis with any of her other children.  Mom denies having any questions or concerns.  LC offered hand pump and mom did want one to take home and declined for LC to open pump up to review usage.  Mom said she was familiar with pump.    Patient Name: Girl Jazel Roxan HockeyRobinson ZOXWR'UToday's Date: 01/22/2017     Maternal Data    Feeding Feeding Type: Bottle Fed - Formula  LATCH Score/Interventions                      Lactation Tools Discussed/Used     Consult Status      Maryruth HancockKelly Suzanne [facility] 01/22/2017, 10:16 AM

## 2017-01-22 NOTE — Discharge Summary (Signed)
Obstetric Discharge Summary Reason for Admission: [redacted] weeks EGA, SOL Prenatal Procedures: ultrasound Intrapartum Procedures: shoulder dystocia Postpartum Procedures: none Complications-Operative and Postpartum: none Hemoglobin  Date Value Ref Range Status  01/21/2017 12.0 12.0 - 15.0 g/dL Final   HCT  Date Value Ref Range Status  01/21/2017 35.0 (L) 36.0 - 46.0 % Final    Physical Exam:  General: alert Lochia: appropriate Uterine Fundus: firm and NT at U-2 DVT Evaluation: No evidence of DVT seen on physical exam.  Discharge Diagnoses: Term Pregnancy-delivered  Discharge Information: Date: 01/22/2017 Activity: pelvic rest Diet: routine Medications: PNV, Ibuprofen and depo provera prior to discharge, IUD at pp visit Condition: stable Instructions: refer to practice specific booklet Discharge to: home Follow-up Information    Michele Maldonado, CNM. Schedule an appointment as soon as possible for a visit in 6 day(s).   Specialty:  Obstetrics and Gynecology Contact information: [address] [place] [id number] [phone]           Newborn Data: Live born female  Birth Weight: 9 lb 11 oz (4394 g) APGAR: 8, 9  Home with mother.  Michele Maldonado Michele Maldonado 01/22/2017, 8:03 AM

## 2017-01-24 ENCOUNTER — Inpatient Hospital Stay (HOSPITAL_COMMUNITY)
Admission: RE | Admit: 2017-01-24 | Payer: Medicaid Other | Source: Ambulatory Visit | Admitting: Obstetrics and Gynecology

## 2017-01-24 ENCOUNTER — Other Ambulatory Visit: Payer: Self-pay | Admitting: Obstetrics & Gynecology

## 2017-03-03 ENCOUNTER — Ambulatory Visit (INDEPENDENT_AMBULATORY_CARE_PROVIDER_SITE_OTHER): Payer: Medicaid Other | Admitting: Student

## 2017-03-03 DIAGNOSIS — Z3480 Encounter for supervision of other normal pregnancy, unspecified trimester: Secondary | ICD-10-CM

## 2017-03-03 MED ORDER — NORETHINDRONE 0.35 MG PO TABS: 1.0000 | ORAL_TABLET | Freq: Every day | ORAL | 11 refills | Status: DC

## 2017-03-03 NOTE — Progress Notes (Signed)
Subjective:     Michele Maldonado is a 31 y.o. female who presents for a postpartum visit. She is 5 weeks postpartum following a spontaneous vaginal delivery. I have fully reviewed the prenatal and intrapartum course. The delivery was at 40 gestational weeks. Outcome: spontaneous vaginal delivery and vaginal birth after cesarean (VBAC). Anesthesia: epidural. Postpartum course has been uneventful. Baby's course has been uneventful. Baby is feeding by bottle - Similac Advance. Bleeding no bleeding. Bowel function is normal. Bladder function is normal. Patient is sexually active. Contraception method is OCP (estrogen/progesterone). Postpartum depression screening: negative.  The following portions of the patient's history were reviewed and updated as appropriate: allergies, current medications, past family history, past medical history, past social history, past surgical history and problem list.  Review of Systems Pertinent items are noted in HPI.   Objective:    There were no vitals taken for this visit.  General:  alert, cooperative and no distress   Breasts:  negative  Lungs: clear to auscultation bilaterally  Heart:  regular rate and rhythm, S1, S2 normal, no murmur, click, rub or gallop  Abdomen: soft, non-tender; bowel sounds normal; no masses,  no organomegaly   Vulva:  not evaluated  Vagina: not evaluated  Cervix:  not evaluated  Corpus: not examined  Adnexa:  not evaluated  Rectal Exam: Not performed.        Vitals:   03/03/17 1026  BP: 102/60  Pulse: 72    Assessment:     Normal  postpartum exam. Pap smear not done at today's visit.   Plan:    1. Contraception: oral progesterone-only contraceptive 2. Patient coping well; no complaints.  3. Follow up in: 1 year or as needed.

## 2017-03-03 NOTE — Patient Instructions (Signed)

## 2017-09-10 ENCOUNTER — Emergency Department (HOSPITAL_COMMUNITY)
Admission: EM | Admit: 2017-09-10 | Discharge: 2017-09-10 | Disposition: A | Discharge status: Home / Self Care | Payer: Medicaid Other | Attending: Emergency Medicine | Admitting: Emergency Medicine

## 2017-09-10 ENCOUNTER — Encounter (HOSPITAL_COMMUNITY): Payer: Self-pay | Admitting: Emergency Medicine

## 2017-09-10 ENCOUNTER — Emergency Department (HOSPITAL_COMMUNITY): Payer: Medicaid Other

## 2017-09-10 DIAGNOSIS — O4690 Antepartum hemorrhage, unspecified, unspecified trimester: Secondary | ICD-10-CM

## 2017-09-10 DIAGNOSIS — O26891 Other specified pregnancy related conditions, first trimester: Secondary | ICD-10-CM | POA: Diagnosis not present

## 2017-09-10 DIAGNOSIS — R102 Pelvic and perineal pain: Secondary | ICD-10-CM | POA: Diagnosis not present

## 2017-09-10 DIAGNOSIS — R05 Cough: Secondary | ICD-10-CM | POA: Insufficient documentation

## 2017-09-10 DIAGNOSIS — O9989 Other specified diseases and conditions complicating pregnancy, childbirth and the puerperium: Secondary | ICD-10-CM | POA: Insufficient documentation

## 2017-09-10 DIAGNOSIS — O4691 Antepartum hemorrhage, unspecified, first trimester: Secondary | ICD-10-CM | POA: Insufficient documentation

## 2017-09-10 DIAGNOSIS — B9689 Other specified bacterial agents as the cause of diseases classified elsewhere: Secondary | ICD-10-CM

## 2017-09-10 DIAGNOSIS — O99511 Diseases of the respiratory system complicating pregnancy, first trimester: Secondary | ICD-10-CM | POA: Diagnosis not present

## 2017-09-10 DIAGNOSIS — Z3A10 10 weeks gestation of pregnancy: Secondary | ICD-10-CM | POA: Insufficient documentation

## 2017-09-10 DIAGNOSIS — Z79899 Other long term (current) drug therapy: Secondary | ICD-10-CM | POA: Diagnosis not present

## 2017-09-10 DIAGNOSIS — O23591 Infection of other part of genital tract in pregnancy, first trimester: Secondary | ICD-10-CM | POA: Diagnosis not present

## 2017-09-10 DIAGNOSIS — O2341 Unspecified infection of urinary tract in pregnancy, first trimester: Secondary | ICD-10-CM | POA: Insufficient documentation

## 2017-09-10 DIAGNOSIS — O418X10 Other specified disorders of amniotic fluid and membranes, first trimester, not applicable or unspecified: Secondary | ICD-10-CM | POA: Insufficient documentation

## 2017-09-10 DIAGNOSIS — N390 Urinary tract infection, site not specified: Secondary | ICD-10-CM

## 2017-09-10 DIAGNOSIS — O468X1 Other antepartum hemorrhage, first trimester: Secondary | ICD-10-CM | POA: Insufficient documentation

## 2017-09-10 DIAGNOSIS — B9789 Other viral agents as the cause of diseases classified elsewhere: Secondary | ICD-10-CM

## 2017-09-10 DIAGNOSIS — O200 Threatened abortion: Secondary | ICD-10-CM | POA: Diagnosis not present

## 2017-09-10 DIAGNOSIS — J069 Acute upper respiratory infection, unspecified: Secondary | ICD-10-CM

## 2017-09-10 DIAGNOSIS — N760 Acute vaginitis: Secondary | ICD-10-CM

## 2017-09-10 LAB — CBC
HEMATOCRIT: 36.9 % (ref 36.0–46.0)
Hemoglobin: 12.7 g/dL (ref 12.0–15.0)
MCH: 29.9 pg (ref 26.0–34.0)
MCHC: 34.4 g/dL (ref 30.0–36.0)
MCV: 86.8 fL (ref 78.0–100.0)
Platelets: 182 10*3/uL (ref 150–400)
RBC: 4.25 MIL/uL (ref 3.87–5.11)
RDW: 14 % (ref 11.5–15.5)
WBC: 9.3 10*3/uL (ref 4.0–10.5)

## 2017-09-10 LAB — COMPREHENSIVE METABOLIC PANEL
ALT: 12 U/L — AB (ref 14–54)
AST: 14 U/L — ABNORMAL LOW (ref 15–41)
Albumin: 3.6 g/dL (ref 3.5–5.0)
Alkaline Phosphatase: 53 U/L (ref 38–126)
Anion gap: 7 (ref 5–15)
BILIRUBIN TOTAL: 0.6 mg/dL (ref 0.3–1.2)
BUN: 5 mg/dL — ABNORMAL LOW (ref 6–20)
CHLORIDE: 105 mmol/L (ref 101–111)
CO2: 22 mmol/L (ref 22–32)
Calcium: 8.8 mg/dL — ABNORMAL LOW (ref 8.9–10.3)
Creatinine, Ser: 0.64 mg/dL (ref 0.44–1.00)
Glucose, Bld: 94 mg/dL (ref 65–99)
Potassium: 3.8 mmol/L (ref 3.5–5.1)
Sodium: 134 mmol/L — ABNORMAL LOW (ref 135–145)
Total Protein: 7.1 g/dL (ref 6.5–8.1)

## 2017-09-10 LAB — ABO/RH: ABO/RH(D): B POS

## 2017-09-10 LAB — URINALYSIS, ROUTINE W REFLEX MICROSCOPIC
BILIRUBIN URINE: NEGATIVE
Glucose, UA: NEGATIVE mg/dL
Ketones, ur: NEGATIVE mg/dL
LEUKOCYTES UA: NEGATIVE
NITRITE: NEGATIVE
PH: 5 (ref 5.0–8.0)
Protein, ur: NEGATIVE mg/dL
Specific Gravity, Urine: 1.021 (ref 1.005–1.030)

## 2017-09-10 LAB — WET PREP, GENITAL
SPERM: NONE SEEN
Trich, Wet Prep: NONE SEEN
YEAST WET PREP: NONE SEEN

## 2017-09-10 LAB — I-STAT BETA HCG BLOOD, ED (MC, WL, AP ONLY)

## 2017-09-10 LAB — LIPASE, BLOOD: LIPASE: 20 U/L (ref 11–51)

## 2017-09-10 LAB — HCG, QUANTITATIVE, PREGNANCY: hCG, Beta Chain, Quant, S: 85358 m[IU]/mL — ABNORMAL HIGH (ref <5)

## 2017-09-10 MED ORDER — METRONIDAZOLE 0.75 % VA GEL: 1.0000 | Freq: Every day | VAGINAL | 0 refills | Status: AC

## 2017-09-10 MED ORDER — CEPHALEXIN 500 MG PO CAPS: ORAL_CAPSULE | ORAL | 0 refills | Status: DC

## 2017-09-10 MED ORDER — PRENATAL COMPLETE 14-0.4 MG PO TABS: 1.0000 | ORAL_TABLET | Freq: Every day | ORAL | 2 refills | Status: DC

## 2017-09-10 MED ORDER — SODIUM CHLORIDE 0.9 % IV BOLUS (SEPSIS)
1000.0000 mL | Freq: Once | INTRAVENOUS | Status: AC
Start: 2017-09-10 — End: 2017-09-10
  Administered 2017-09-10: 1000 mL via INTRAVENOUS

## 2017-09-10 MED ORDER — DEXTROSE 5 % IV SOLN
1.0000 g | Freq: Once | INTRAVENOUS | Status: AC
  Administered 2017-09-10: 1 g via INTRAVENOUS
  Filled 2017-09-10: qty 10

## 2017-09-10 NOTE — ED Provider Notes (Signed)
[facility] EMERGENCY DEPARTMENT Provider Note   CSN: [id number] Arrival date & time: 09/10/17  1022     History   Chief Complaint Chief Complaint  Patient presents with  . Abdominal Pain  . Vaginal Bleeding  . Possible Pregnancy    + home pregnancy test    HPI Edit Michele Maldonado is a 31 y.o. [phone] (4 term deliveries total, 1 PTL, 1 SAb; and one infant who was full term but sadly died Michele month of SIDS at age 46 months) female currently ~[redacted]w[redacted]d pregnant based on LMP of 07/05/17, with a PMHx of nephrotic syndrome, mitral valve prolapse, thrombocytopenia, and preterm labor, who presents to the ED with complaints of lower abdominal pain and back pain that began around 7 AM after she awoke from sleep. Patient states that for the Michele 2 days she's had flulike symptoms including sore throat, stuffy nose, general malaise, chills, and cough with yellow sputum production; she used TheraFlu Michele night which helped her flu symptoms however this morning she woke up with lower back and belly pain and thought that it could be from the TheraFlu. She has multiple sick children at home with URI type symptoms. She describes the pain as 4/10 constant sore bilateral lower abdominal pain that radiates into bilateral lower back, with no known aggravating or alleviating factors, no treatments tried prior to arrival. She also reports that when she went to the bathroom and wiped that she noticed some light pinkish vaginal spotting, no passage of clots. She also mentions that she has chronic nausea which is unchanged from baseline. LMP was 07/05/17, she has had multiple pregnancy tests at home which were positive. She does not currently have an OB/GYN for this pregnancy, states that she just had a 79-month-old infant who passed away from SIDS and she "doesn't want to go through the hospital" for her OB/GYN care, she wants to "get a private clinic", but she has not yet established with anyone for this  pregnancy. However, she has seen OBGYNs in town before for her prior pregnancies.   She denies fevers, CP, SOB, vomiting, diarrhea/constipation, obstipation, melena, hematochezia, hematuria, dysuria, urine frequency/urgency, malodorous urine, vaginal discharge, myalgias, arthralgias, numbness, tingling, focal weakness, or any other complaints at this time. Denies recent travel, sick contacts with abdominal complaints, suspicious food intake, EtOH use, or frequent NSAID use.    The history is provided by the patient and medical records. No language interpreter was used.  Abdominal Pain   This is a new problem. The current episode started 6 to 12 hours ago. The problem occurs constantly. The problem has not changed since onset.The pain is associated with an unknown factor. The pain is located in the RLQ, LLQ and suprapubic region. Quality: sore. The pain is at a severity of 4/10. The pain is mild. Associated symptoms include nausea (chronic, unchanged). Pertinent negatives include fever, diarrhea, flatus, hematochezia, melena, vomiting, constipation, dysuria, frequency, hematuria, arthralgias and myalgias. Nothing aggravates the symptoms. Nothing relieves the symptoms.  Vaginal Bleeding  Primary symptoms include vaginal bleeding.  Primary symptoms include no dysuria. Associated symptoms include abdominal pain and nausea (chronic, unchanged). Pertinent negatives include no constipation, no diarrhea, no vomiting and no frequency.  Possible Pregnancy  Associated symptoms include abdominal pain. Pertinent negatives include no chest pain and no shortness of breath.    Past Medical History:  Diagnosis Date  . MVP (mitral valve prolapse)   . Preterm labor     Patient Active Problem List   Diagnosis  Date Noted  . History of nephrotic syndrome 10/26/2016  . Marijuana abuse 07/24/2016  . Previous cesarean delivery, antepartum 12/30/2013  . Thrombocytopenia (HCC) 12/27/2013    Past Surgical History:    Procedure Laterality Date  . CESAREAN SECTION N/A 12/29/2013   Procedure: CESAREAN SECTION;  Surgeon: Allie Bossier, MD;  Location: [facility];  Service: Obstetrics;  Laterality: N/A;  . REPAIR VAGINAL CUFF N/A 12/30/2013   Procedure: REPAIR VAGINAL CUFF;  Surgeon: Allie Bossier, MD;  Location: [facility];  Service: Gynecology;  Laterality: N/A;  Insertion of Bakri Balloon    OB History    Gravida Para Term Preterm AB Living   5 5 4 1  0 5   SAB TAB Ectopic Multiple Live Births   0 0 0 0 5       Home Medications    Prior to Admission medications   Medication Sig Start Date End Date Taking? Authorizing Provider  ibuprofen (ADVIL,MOTRIN) 600 MG tablet Take 1 tablet (600 mg total) by mouth every 6 (six) hours. Patient not taking: Reported on 03/03/2017 01/22/17   Allie Bossier, MD  norethindrone (MICRONOR,CAMILA,ERRIN) 0.35 MG tablet Take 1 tablet (0.35 mg total) by mouth daily. 03/03/17   Marylene Land, CNM  Prenatal Vit-Fe Fumarate-FA (PRENATAL MULTIVITAMIN) TABS tablet Take 1 tablet by mouth daily.    [provider]    Family History Family History  Problem Relation Age of Onset  . Hypertension Maternal Grandmother   . Diabetes Maternal Grandmother     Social History Social History  Substance Use Topics  . Smoking status: Never Smoker  . Smokeless tobacco: Never Used  . Alcohol use No     Allergies   Patient has no known allergies.   Review of Systems Review of Systems  Constitutional: Positive for chills. Negative for fever.  HENT: Positive for congestion and sore throat.   Respiratory: Positive for cough. Negative for shortness of breath.   Cardiovascular: Negative for chest pain.  Gastrointestinal: Positive for abdominal pain and nausea (chronic, unchanged). Negative for blood in stool, constipation, diarrhea, flatus, hematochezia, melena and vomiting.  Genitourinary: Positive for vaginal bleeding. Negative for dysuria, frequency, hematuria, urgency and  vaginal discharge.       No malodorous urine  Musculoskeletal: Positive for back pain. Negative for arthralgias and myalgias.  Skin: Negative for color change.  Allergic/Immunologic: Negative for immunocompromised state.  Neurological: Negative for weakness and numbness.  Psychiatric/Behavioral: Negative for confusion.   All other systems reviewed and are negative for acute change except as noted in the HPI.    Physical Exam Updated Vital Signs BP 107/60 (BP Location: Right Arm)   Pulse 80   Temp 98.7 F (37.1 C) (Oral)   Resp 14   Ht 5\' 8"  (1.727 m)   LMP 07/05/2017   SpO2 100%   Breastfeeding? No   Physical Exam  Constitutional: She is oriented to person, place, and time. Vital signs are normal. She appears well-developed and well-nourished.  Non-toxic appearance. No distress.  Afebrile, nontoxic, NAD  HENT:  Head: Normocephalic and atraumatic.  Mouth/Throat: Oropharynx is clear and moist and mucous membranes are normal.  Eyes: Conjunctivae and EOM are normal. Right eye exhibits no discharge. Left eye exhibits no discharge.  Neck: Normal range of motion. Neck supple.  Cardiovascular: Normal rate, regular rhythm, normal heart sounds and intact distal pulses.  Exam reveals no gallop and no friction rub.   No murmur heard. Pulmonary/Chest: Effort normal and breath sounds  normal. No respiratory distress. She has no decreased breath sounds. She has no wheezes. She has no rhonchi. She has no rales.  CTAB in all lung fields, no w/r/r, no hypoxia or increased WOB, speaking in full sentences, SpO2 100% on RA   Abdominal: Soft. Normal appearance and bowel sounds are normal. She exhibits no distension. There is tenderness in the right lower quadrant, suprapubic area and left lower quadrant. There is no rigidity, no rebound, no guarding, no CVA tenderness, no tenderness at McBurney's point and negative Murphy's sign.  Soft, nondistended, +BS throughout, with very mild lower abd TTP  bilaterally, no r/g/r, neg murphy's, neg mcburney's, no CVA TTP   Genitourinary: Pelvic exam was performed with patient supine. There is no rash, tenderness or lesion on the right labia. There is no rash, tenderness or lesion on the left labia. Uterus is enlarged. Uterus is not deviated, not fixed and not tender. Cervix exhibits no motion tenderness, no discharge and no friability. Right adnexum displays no mass, no tenderness and no fullness. Left adnexum displays no mass, no tenderness and no fullness. There is bleeding in the vagina. No erythema or tenderness in the vagina. Vaginal discharge found.  Genitourinary Comments: Chaperone present for exam. No rashes, lesions, or tenderness to external genitalia. No erythema, injury, or tenderness to vaginal mucosa. Scant pinkish blood in vaginal vault, mixed with some scant physiologic clearish vaginal discharge. No adnexal masses, tenderness, or fullness. No CMT, cervical friability, or discharge from cervical os. Cervical os is closed. Uterus non-deviated, mobile, nonTTP, and enlarged/gravid just above the pubic symphysis.  Musculoskeletal: Normal range of motion.  Neurological: She is alert and oriented to person, place, and time. She has normal strength. No sensory deficit.  Skin: Skin is warm, dry and intact. No rash noted.  Psychiatric: Her affect is blunt.  Very flat affect, although pleasant and cooperative  Nursing note and vitals reviewed.    ED Treatments / Results  Labs (all labs ordered are listed, but only abnormal results are displayed) Labs Reviewed  WET PREP, GENITAL - Abnormal; Notable for the following:       Result Value   Clue Cells Wet Prep HPF POC PRESENT (*)    WBC, Wet Prep HPF POC FEW (*)    All other components within normal limits  COMPREHENSIVE METABOLIC PANEL - Abnormal; Notable for the following:    Sodium 134 (*)    BUN 5 (*)    Calcium 8.8 (*)    AST 14 (*)    ALT 12 (*)    All other components within normal  limits  URINALYSIS, ROUTINE W REFLEX MICROSCOPIC - Abnormal; Notable for the following:    APPearance HAZY (*)    Hgb urine dipstick SMALL (*)    Bacteria, UA MANY (*)    Squamous Epithelial / LPF 0-5 (*)    All other components within normal limits  HCG, QUANTITATIVE, PREGNANCY - Abnormal; Notable for the following:    hCG, Beta Chain, Quant, S 85,358 (*)    All other components within normal limits  I-STAT BETA HCG BLOOD, ED ([facility], [facility], [facility] ONLY) - Abnormal; Notable for the following:    I-stat hCG, quantitative >2,000.0 (*)    All other components within normal limits  URINE CULTURE  LIPASE, BLOOD  CBC  RPR  HIV ANTIBODY (ROUTINE TESTING)  ABO/RH  GC/CHLAMYDIA PROBE AMP ([place]) NOT AT [facility]    EKG  EKG Interpretation None       Radiology  US Ob Comp Less 14 Wks  Result Date: 09/10/2017 CLINICAL DATA:  Pelvic pain. Estimated gestational age per LMP 9 weeks 4 days. Quantitative beta HCG greater than 2000. EXAM: OBSTETRIC <14 WK [place] AND TRANSVAGINAL OB US TECHNIQUE: Both transabdominal and transvaginal ultrasound examinations were performed for complete evaluation of the gestation as well as the maternal uterus, adnexal regions, and pelvic cul-de-sac. Transvaginal technique was performed to assess early pregnancy. COMPARISON:  None. FINDINGS: Intrauterine gestational sac: Single visualized. Yolk sac:  Visualized. Embryo:  Visualized. Cardiac Activity: Visualized. Heart Rate: 163  bpm CRL:  32.7  mm   10 w   1 d                  [place] EDC: 04/07/2018 Subchorionic hemorrhage: Small subchorionic hemorrhage visualized measuring 6 x 9 x 10 mm. Maternal uterus/adnexae: Ovaries are within normal in size, shape and position. No free pelvic fluid. IMPRESSION: Single live IUP with estimated gestational age [redacted] weeks 1 day. Small subchronic hemorrhage. Recommend follow-up ultrasound as clinically indicated. Electronically Signed   By: Elberta Fortis M.D.   On: 09/10/2017 14:55   US Ob  Transvaginal  Result Date: 09/10/2017 CLINICAL DATA:  Pelvic pain. Estimated gestational age per LMP 9 weeks 4 days. Quantitative beta HCG greater than 2000. EXAM: OBSTETRIC <14 WK [place] AND TRANSVAGINAL OB US TECHNIQUE: Both transabdominal and transvaginal ultrasound examinations were performed for complete evaluation of the gestation as well as the maternal uterus, adnexal regions, and pelvic cul-de-sac. Transvaginal technique was performed to assess early pregnancy. COMPARISON:  None. FINDINGS: Intrauterine gestational sac: Single visualized. Yolk sac:  Visualized. Embryo:  Visualized. Cardiac Activity: Visualized. Heart Rate: 163  bpm CRL:  32.7  mm   10 w   1 d                  [place] EDC: 04/07/2018 Subchorionic hemorrhage: Small subchorionic hemorrhage visualized measuring 6 x 9 x 10 mm. Maternal uterus/adnexae: Ovaries are within normal in size, shape and position. No free pelvic fluid. IMPRESSION: Single live IUP with estimated gestational age [redacted] weeks 1 day. Small subchronic hemorrhage. Recommend follow-up ultrasound as clinically indicated. Electronically Signed   By: Elberta Fortis M.D.   On: 09/10/2017 14:55    Procedures Procedures (including critical care time)  Medications Ordered in ED Medications  sodium chloride 0.9 % bolus 1,000 mL (1,000 mLs Intravenous New Bag/Maldonado 09/10/17 1354)  cefTRIAXone (ROCEPHIN) 1 g in dextrose 5 % 50 mL IVPB (1 g Intravenous New Bag/Maldonado 09/10/17 1354)     Initial Impression / Assessment and Plan / ED Course  I have reviewed the triage vital signs and the nursing notes.  Pertinent labs & imaging results that were available during my care of the patient were reviewed by me and considered in my medical decision making (see chart for details).     31 y.o. female here with lower back and abd pain since 7am today, thought it was because she used theraflu Michele night (also has cold symptoms x2 days, which have improved with the theraflu). Also has chronic  nausea, which is at baseline. Started having a light pink spotting with wiping this morning. +UPT at home, LMP 07/05/17, not yet established prenatal care, just had an infant in 2023/03/21 who unfortunately died of SIDS Michele month. On exam, clear lung exam, mild lower abd TTP, nonperitoneal, no flank pain. Work up thus far reveals: U/A with many bacteria, 0-5 squamous, 0-5 WBC, nitrite and leuk neg,  so somewhat inconclusive but could be UTI, will send for UCx. BetaHCG+. Lipase WNL, CMP essentially WNL, CBC WNL. Will proceed with pelvic exam, STD testing, quantHCG, and likely pelvic U/S after exam completed. Pt declines wanting anything for her symptoms at this time. Will reassess shortly.   1:49 PM Pelvic exam reveals scant pinkish vaginal blood mixed with clearish vaginal discharge which appears physiologic, no cervical discharge or CMT, no adnexal tenderness, cervical os closed. Will give rocephin for now, to treat presumed UTI, and await remainder of work up including pelvic U/S and wet prep; will hold off on azithromycin for now until we have the wet prep results. Will reassess shortly.   4:27 PM Pelvic U/S showing single live IUP at [redacted]w[redacted]d with a small subchorionic hemorrhage noted. BetaHCG 85k, appropriate for gestational age. Wet prep with +clue cells, only few WBCs; doubt need for empiric GC/CT treatment Maldonado lack of clinical findings, but will treat BV since pt is pregnant. ABO/Rh B+, no need for rhogam. Advised pelvic rest, prenatal vitamin use, adequate hydration, tylenol for pain, will send home with abx for UTI and intravaginal metronidazole for BV. Discussed abstinence until STD results return, f/up with health dept for future STD concerns, safe sex encouraged, and discussed having partners tested and treated before re-engaging in intercourse after pelvic rest period.  F/up with OBGYN in 2-3 days for establishment of prenatal care and recheck of symptoms (advised in the meantime to go back to who she  saw before, while she works on finding someone new if that's what she prefers to do). Strict return precautions advised, go to [facility] if any changes/worsening arises. Discussed use of OTC remedies for cough/cold symptoms, as long as the meds are safe for pregnancy. I explained the diagnosis and have Maldonado explicit precautions to return to the ER including for any other new or worsening symptoms. The patient understands and accepts the medical plan as it's been dictated and I have answered their questions. Discharge instructions concerning home care and prescriptions have been Maldonado. The patient is STABLE and is discharged to home in good condition.    Final Clinical Impressions(s) / ED Diagnoses   Final diagnoses:  Pelvic pain in pregnancy, antepartum, first trimester  Lower urinary tract infectious disease  Vaginal bleeding in pregnancy  Subchorionic hemorrhage of placenta in first trimester, single or unspecified fetus  BV (bacterial vaginosis)  Threatened miscarriage  Viral URI with cough    New Prescriptions New Prescriptions   CEPHALEXIN (KEFLEX) 500 MG CAPSULE    2 caps po bid x 7 days   METRONIDAZOLE (METROGEL VAGINAL) 0.75 % VAGINAL GEL    Place 1 Applicatorful vaginally at bedtime. x5 days   PRENATAL VIT-FE FUMARATE-FA (PRENATAL COMPLETE) 14-0.4 MG TABS    Take 1 tablet by mouth daily after breakfast.       [address], [place], [place] 09/10/17 2054    Dione Booze, MD 09/11/17 [phone]

## 2017-09-10 NOTE — ED Notes (Signed)
Pt in US

## 2017-09-10 NOTE — ED Triage Notes (Signed)
Pt c/o abdominal pain, back pain and vaginal spotting onset yesterday after taking theraflu. Pt took home pregnancy test 2 weeks ago that was positive.

## 2017-09-10 NOTE — Discharge Instructions (Signed)
Bleeding during the first 20 weeks of pregnancy is common. This is sometimes called a threatened miscarriage. This is a pregnancy that is threatening to end before the twentieth week of pregnancy.  Miscarriages occur in 15 to 20% of all pregnancies and usually occur during the first 13 weeks of the pregnancy. The exact cause of a miscarriage is usually never known. A miscarriage is nature?s way of ending a pregnancy that is abnormal or would not make it to term.   You have been tested for gonorrhea, chlamydia, HIV, and Syphilis, and the hospital lab will call you if the tests are positive. Do not have sexual intercourse until you've found out about your test results. Make sure all sexual partners are tested and treated before re-engaging in sexual intercourse.   You have a urinary tract infection, take the antibiotics until completed. You also have bacterial vaginosis, use the vaginal cream as directed until finished.  Continue taking prenatal vitamins. You can use tylenol safely in pregnancy, use as needed for pain, but avoid NSAIDs like ibuprofen or aleve/motrin/etc. For your cold, use over the counter medications for cough/cold, but make sure it is safe for use in pregnancy. Follow the instructions below regarding your diagnosis today. Establish prenatal care with an OBGYN, you can call the women's outpatient clinic to do this, and you will need to be checked in 2 days for re-evaluation as described below. [facility] (similar to their version of an emergency room) for changes/worsening symptoms as outlined below.   HOME CARE INSTRUCTIONS DO NOT USE TAMPONS. Do not douche, have sexual intercourse or orgasms until approved by your caregiver.  [facility] today or tomorrow to schedule a re-evaluation of your pregnancy and repeat blood betaHCG test in 2 days.    SEEK IMMEDIATE MEDICAL ATTENTION AT THE [facility] IF: You have severe cramps in your stomach,  back, or abdomen.  You have a sudden onset of severe pain in the lower part of your abdomen.  You run an unexplained temperature of 101 F (38.3 C) or higher.  You pass large clots or tissue. Save any tissue for your caregiver to inspect.  Your bleeding increases or you become light-headed, weak, or have fainting episodes.

## 2017-09-11 LAB — HIV ANTIBODY (ROUTINE TESTING W REFLEX): HIV Screen 4th Generation wRfx: NONREACTIVE

## 2017-09-11 LAB — RPR: RPR: NONREACTIVE

## 2017-09-12 LAB — GC/CHLAMYDIA PROBE AMP (~~LOC~~) NOT AT ARMC
Chlamydia: NEGATIVE
NEISSERIA GONORRHEA: NEGATIVE

## 2017-09-13 LAB — URINE CULTURE: Culture: >=100000 — AB

## 2017-09-14 ENCOUNTER — Telehealth: Payer: Self-pay | Admitting: *Deleted

## 2017-09-14 NOTE — Telephone Encounter (Signed)
Post ED Visit - Positive Culture Follow-up  Culture report reviewed by antimicrobial stewardship pharmacist:  []  Enzo BiNathan Batchelder, Pharm.D. []  Celedonio MiyamotoJeremy Frens, Pharm.D., BCPS AQ-ID []  Garvin FilaMike Maccia, Pharm.D., BCPS []  Georgina PillionElizabeth Martin, Pharm.D., BCPS []  WillistonMinh Pham, [address] Rainbow BoulevardPharm.D., BCPS, AAHIVP []  Estella HuskMichelle Turner, Pharm.D., BCPS, AAHIVP []  Lysle Pearlachel Rumbarger, PharmD, BCPS []  Casilda Carlsaylor Stone, PharmD, BCPS []  Pollyann SamplesAndy Johnston, PharmD, BCPS Lilli LightJon Oriet, PharmD  Positive urine culture Treated with Ce[halexin, organism sensitive to the same and no further patient follow-up is required at this time.  Virl AxeRobertson, Lanard Arguijo Talley 09/14/2017, 12:07 PM

## 2017-09-15 ENCOUNTER — Encounter: Payer: Self-pay | Admitting: Obstetrics and Gynecology

## 2017-09-19 ENCOUNTER — Encounter (HOSPITAL_COMMUNITY): Payer: Self-pay

## 2017-09-19 ENCOUNTER — Inpatient Hospital Stay (HOSPITAL_COMMUNITY)
Admission: AD | Admit: 2017-09-19 | Discharge: 2017-09-19 | Disposition: A | Discharge status: Home / Self Care | Payer: Medicaid Other | Source: Ambulatory Visit | Attending: Family Medicine | Admitting: Family Medicine

## 2017-09-19 DIAGNOSIS — O208 Other hemorrhage in early pregnancy: Secondary | ICD-10-CM | POA: Insufficient documentation

## 2017-09-19 DIAGNOSIS — O26851 Spotting complicating pregnancy, first trimester: Secondary | ICD-10-CM | POA: Insufficient documentation

## 2017-09-19 DIAGNOSIS — O468X1 Other antepartum hemorrhage, first trimester: Secondary | ICD-10-CM

## 2017-09-19 DIAGNOSIS — O418X10 Other specified disorders of amniotic fluid and membranes, first trimester, not applicable or unspecified: Secondary | ICD-10-CM

## 2017-09-19 DIAGNOSIS — Z3A11 11 weeks gestation of pregnancy: Secondary | ICD-10-CM | POA: Diagnosis not present

## 2017-09-19 DIAGNOSIS — N939 Abnormal uterine and vaginal bleeding, unspecified: Secondary | ICD-10-CM | POA: Diagnosis present

## 2017-09-19 LAB — URINALYSIS, ROUTINE W REFLEX MICROSCOPIC
Bilirubin Urine: NEGATIVE
GLUCOSE, UA: NEGATIVE mg/dL
Hgb urine dipstick: NEGATIVE
Ketones, ur: NEGATIVE mg/dL
LEUKOCYTES UA: NEGATIVE
Nitrite: NEGATIVE
Protein, ur: NEGATIVE mg/dL
Specific Gravity, Urine: 1.023 (ref 1.005–1.030)
pH: 5 (ref 5.0–8.0)

## 2017-09-19 NOTE — MAU Provider Note (Signed)
Chief Complaint: Vaginal Bleeding and Vaginal Discharge   None     SUBJECTIVE HPI: Michele Maldonado is a 31 y.o. [id number] at 296w3d by LMP who presents to maternity admissions reporting light vaginal bleeding, when wiping, with the presence of flecks of tissue starting this morning.  She was seen in the ED on 09/10/17 and had negative vaginal cultures and US showed IUP with small subchorionic hemorrhage.  She reports only seeing blood when urinating on 10/27 but the bleeding today is every time she wipes and includes the thicker tissue-like pieces.  She denies pain. There are no other associated symptoms. She denies vaginal itching/burning, urinary symptoms, h/a, dizziness, n/v, or fever/chills.     HPI  Past Medical History:  Diagnosis Date  . MVP (mitral valve prolapse)   . Preterm labor    No past surgical history on file. Social History   Socioeconomic History  . Marital status: Single    Spouse name: Not on file  . Number of children: Not on file  . Years of education: Not on file  . Highest education level: Not on file  Social Needs  . Financial resource strain: Not on file  . Food insecurity - worry: Not on file  . Food insecurity - inability: Not on file  . Transportation needs - medical: Not on file  . Transportation needs - non-medical: Not on file  Occupational History  . Not on file  Tobacco Use  . Smoking status: Never Smoker  . Smokeless tobacco: Never Used  Substance and Sexual Activity  . Alcohol use: No  . Drug use: No    Comment: former  . Sexual activity: Yes    Birth control/protection: None  Other Topics Concern  . Not on file  Social History Narrative  . Not on file   No current facility-administered medications on file prior to encounter.    Current Outpatient Medications on File Prior to Encounter  Medication Sig Dispense Refill  . cephALEXin (KEFLEX) 500 MG capsule 2 caps po bid x 7 days 28 capsule 0  . Prenatal Vit-Fe Fumarate-FA (PRENATAL  COMPLETE) 14-0.4 MG TABS Take 1 tablet by mouth daily after breakfast. 30 each 2   No Known Allergies  ROS:  Review of Systems  Constitutional: Negative for chills, fatigue and fever.  Respiratory: Negative for shortness of breath.   Cardiovascular: Negative for chest pain.  Gastrointestinal: Negative for nausea and vomiting.  Genitourinary: Positive for vaginal bleeding. Negative for difficulty urinating, dysuria, flank pain, pelvic pain, vaginal discharge and vaginal pain.  Neurological: Negative for dizziness and headaches.  Psychiatric/Behavioral: Negative.      I have reviewed patient's Past Medical Hx, Surgical Hx, Family Hx, Social Hx, medications and allergies.   Physical Exam   Patient Vitals for the past 24 hrs:  BP Temp Temp src Pulse Resp Height Weight  09/19/17 0231 (!) 108/59 98.3 F (36.8 C) Oral 70 16 - -  09/19/17 0057 (!) 106/56 98.2 F (36.8 C) Oral 74 16 - -  09/19/17 0046 - - - - - 5\' 8"  (1.727 m) 176 lb (79.8 kg)   Constitutional: Well-developed, well-nourished female in no acute distress.  Cardiovascular: normal rate Respiratory: normal effort GI: Abd soft, non-tender. Pos BS x 4 MS: Extremities nontender, no edema, normal ROM Neurologic: Alert and oriented x 4.  GU: Neg CVAT.  PELVIC EXAM: Cervix pink, visually closed, with some mild erythema above cervical os, no active bleeding noted, scant white creamy discharge, vaginal walls and  external genitalia normal Bimanual exam: Cervix 0/long/high, firm, anterior, neg CMT, uterus nontender, nonenlarged, adnexa without tenderness, enlargement, or mass  FHT 163 by doppler  LAB RESULTS Results for orders placed or performed during the hospital encounter of 09/19/17 (from the past 24 hour(s))  Urinalysis, Routine w reflex microscopic     Status: Abnormal   Collection Time: 09/19/17 12:44 AM  Result Value Ref Range   Color, Urine YELLOW YELLOW   APPearance CLOUDY (A) CLEAR   Specific Gravity, Urine 1.023  1.005 - 1.030   pH 5.0 5.0 - 8.0   Glucose, UA NEGATIVE NEGATIVE mg/dL   Hgb urine dipstick NEGATIVE NEGATIVE   Bilirubin Urine NEGATIVE NEGATIVE   Ketones, ur NEGATIVE NEGATIVE mg/dL   Protein, ur NEGATIVE NEGATIVE mg/dL   Nitrite NEGATIVE NEGATIVE   Leukocytes, UA NEGATIVE NEGATIVE    --/--/B POS (10/27 1257)  IMAGING US Ob Comp Less 14 Wks  Result Date: 09/10/2017 CLINICAL DATA:  Pelvic pain. Estimated gestational age per LMP 9 weeks 4 days. Quantitative beta HCG greater than 2000. EXAM: OBSTETRIC <14 WK [place] AND TRANSVAGINAL OB US TECHNIQUE: Both transabdominal and transvaginal ultrasound examinations were performed for complete evaluation of the gestation as well as the maternal uterus, adnexal regions, and pelvic cul-de-sac. Transvaginal technique was performed to assess early pregnancy. COMPARISON:  None. FINDINGS: Intrauterine gestational sac: Single visualized. Yolk sac:  Visualized. Embryo:  Visualized. Cardiac Activity: Visualized. Heart Rate: 163  bpm CRL:  32.7  mm   10 w   1 d                  [place] EDC: 04/07/2018 Subchorionic hemorrhage: Small subchorionic hemorrhage visualized measuring 6 x 9 x 10 mm. Maternal uterus/adnexae: Ovaries are within normal in size, shape and position. No free pelvic fluid. IMPRESSION: Single live IUP with estimated gestational age [redacted] weeks 1 day. Small subchronic hemorrhage. Recommend follow-up ultrasound as clinically indicated. Electronically Signed   By: Elberta Fortis M.D.   On: 09/10/2017 14:55   US Ob Transvaginal  Result Date: 09/10/2017 CLINICAL DATA:  Pelvic pain. Estimated gestational age per LMP 9 weeks 4 days. Quantitative beta HCG greater than 2000. EXAM: OBSTETRIC <14 WK [place] AND TRANSVAGINAL OB US TECHNIQUE: Both transabdominal and transvaginal ultrasound examinations were performed for complete evaluation of the gestation as well as the maternal uterus, adnexal regions, and pelvic cul-de-sac. Transvaginal technique was performed to assess  early pregnancy. COMPARISON:  None. FINDINGS: Intrauterine gestational sac: Single visualized. Yolk sac:  Visualized. Embryo:  Visualized. Cardiac Activity: Visualized. Heart Rate: 163  bpm CRL:  32.7  mm   10 w   1 d                  [place] EDC: 04/07/2018 Subchorionic hemorrhage: Small subchorionic hemorrhage visualized measuring 6 x 9 x 10 mm. Maternal uterus/adnexae: Ovaries are within normal in size, shape and position. No free pelvic fluid. IMPRESSION: Single live IUP with estimated gestational age [redacted] weeks 1 day. Small subchronic hemorrhage. Recommend follow-up ultrasound as clinically indicated. Electronically Signed   By: Elberta Fortis M.D.   On: 09/10/2017 14:55    MAU Management/MDM: Ordered labs and reviewed results.  Reviewed [place] from previous visit.  Bedside US today reveals IUP with CRL c/w previous dates, normal FHR, subjectively normal fluid.  Pt reassured.  Discussed usual progress of small SCH in first trimester and likely good outcome.  Warning signs given/reasons to return to MAU.  Pt ischarged with strict  bleeding precautions.  ASSESSMENT 1. Spotting affecting pregnancy in first trimester   2. Subchorionic hemorrhage of placenta in first trimester, single or unspecified fetus     PLAN Discharge home Allergies as of 09/19/2017   No Known Allergies     Medication List    STOP taking these medications   ibuprofen 600 MG tablet Commonly known as:  ADVIL,MOTRIN   norethindrone 0.35 MG tablet Commonly known as:  MICRONOR,CAMILA,ERRIN   prenatal multivitamin Tabs tablet   THERAFLU COLD & SORE THROAT PO     TAKE these medications   cephALEXin 500 MG capsule Commonly known as:  KEFLEX 2 caps po bid x 7 days   PRENATAL COMPLETE 14-0.4 MG Tabs Take 1 tablet by mouth daily after breakfast.      Follow-up Information    [facility].   Specialty:  Obstetrics and Gynecology Why:  As scheduled on Tuesday, return to MAU as needed for  emergencies. Contact information: [address] [place] [address] [phone]          Sharen Counter Certified Nurse-Midwife 09/19/2017  4:01 AM

## 2017-09-19 NOTE — Discharge Instructions (Signed)
Subchorionic Hematoma °A subchorionic hematoma is a gathering of blood between the outer wall of the placenta and the inner wall of the womb (uterus). The placenta is the organ that connects the fetus to the wall of the uterus. The placenta performs the feeding, breathing (oxygen to the fetus), and waste removal (excretory work) of the fetus. °Subchorionic hematoma is the most common abnormality found on a result from ultrasonography done during the first trimester or early second trimester of pregnancy. If there has been little or no vaginal bleeding, early small hematomas usually shrink on their own and do not affect your baby or pregnancy. The blood is gradually absorbed over 1-2 weeks. When bleeding starts later in pregnancy or the hematoma is larger or occurs in an older pregnant woman, the outcome may not be as good. Larger hematomas may get bigger, which increases the chances for miscarriage. Subchorionic hematoma also increases the risk of premature detachment of the placenta from the uterus, preterm (premature) labor, and stillbirth. °Follow these instructions at home: °· Avoid heavy lifting (more than 10 lb [4.5 kg]), exercise, sexual intercourse, or douching as directed by your health care provider. °· Keep track of the number of pads you use each day and how soaked (saturated) they are. Write down this information. °· Do not use tampons. °· Keep all follow-up appointments as directed by your health care provider. Your health care provider may ask you to have follow-up blood tests or ultrasound tests or both. °Get help right away if: °· You have severe cramps in your stomach, back, abdomen, or pelvis. °· You have a fever. °· You pass large clots or tissue. Save any tissue for your health care provider to look at. °· Your bleeding increases or you become lightheaded, feel weak, or have fainting episodes. °This information is not intended to replace advice given to you by your health care provider. Make  sure you discuss any questions you have with your health care provider. °Document Released: 02/16/2007 Document Revised: 04/08/2016 Document Reviewed: 05/31/2013 °Elsevier Interactive Patient Education © 2017 Elsevier Inc. ° °

## 2017-09-19 NOTE — MAU Note (Signed)
Pt [facility] and was told she was pregnant and had a subchorionic hemorrhage. States she was spotting at that time, but tonight she noticed what appeared to be tissue. States the [facility]. Pt denies pain. States she is currently on abx for an uti.

## 2017-09-20 ENCOUNTER — Encounter: Payer: Self-pay | Admitting: Obstetrics and Gynecology

## 2017-10-03 ENCOUNTER — Encounter: Payer: Self-pay | Admitting: Advanced Practice Midwife

## 2017-10-03 ENCOUNTER — Ambulatory Visit (INDEPENDENT_AMBULATORY_CARE_PROVIDER_SITE_OTHER): Payer: Medicaid Other | Admitting: Advanced Practice Midwife

## 2017-10-03 ENCOUNTER — Other Ambulatory Visit (HOSPITAL_COMMUNITY)
Admission: RE | Admit: 2017-10-03 | Discharge: 2017-10-03 | Disposition: A | Discharge status: Home / Self Care | Payer: Medicaid Other | Source: Ambulatory Visit | Attending: Obstetrics and Gynecology | Admitting: Obstetrics and Gynecology

## 2017-10-03 VITALS — BP 111/63 | HR 79 | Wt 176.0 lb

## 2017-10-03 DIAGNOSIS — Z3482 Encounter for supervision of other normal pregnancy, second trimester: Secondary | ICD-10-CM

## 2017-10-03 DIAGNOSIS — O34219 Maternal care for unspecified type scar from previous cesarean delivery: Secondary | ICD-10-CM

## 2017-10-03 DIAGNOSIS — Z3481 Encounter for supervision of other normal pregnancy, first trimester: Secondary | ICD-10-CM | POA: Diagnosis not present

## 2017-10-03 DIAGNOSIS — Z3480 Encounter for supervision of other normal pregnancy, unspecified trimester: Secondary | ICD-10-CM

## 2017-10-03 LAB — POCT URINALYSIS DIP (DEVICE)
BILIRUBIN URINE: NEGATIVE
GLUCOSE, UA: NEGATIVE mg/dL
Hgb urine dipstick: NEGATIVE
Ketones, ur: NEGATIVE mg/dL
LEUKOCYTES UA: NEGATIVE
NITRITE: NEGATIVE
Protein, ur: NEGATIVE mg/dL
Specific Gravity, Urine: 1.02 (ref 1.005–1.030)
Urobilinogen, UA: 1 mg/dL (ref 0.0–1.0)
pH: 7.5 (ref 5.0–8.0)

## 2017-10-03 MED ORDER — ASPIRIN EC 81 MG PO TBEC: 81.0000 mg | DELAYED_RELEASE_TABLET | Freq: Every day | ORAL | 11 refills | Status: DC

## 2017-10-03 NOTE — Patient Instructions (Signed)
Places to have your son circumcised:    [facility] [phone] $480 while you are in hospital  [facility] [phone] $244 by 4 wks  [facility] [phone] $175 by 2 wks  [facility] [phone] $250 by 7 days [facility] [phone] $150 by 4 wks  These prices sometimes change but are roughly what you can expect to pay. Please call and confirm pricing.   Circumcision is considered an elective/non-medically necessary procedure. There are many reasons parents decide to have their sons circumsized. During the first year of life circumcised males have a reduced risk of urinary tract infections but after this year the rates between circumcised males and uncircumcised males are the same.  It is safe to have your son circumcised outside of the hospital and the places above perform them regularly.   AREA PEDIATRIC/FAMILY PRACTICE PHYSICIANS  [facility] [address], [address] [place], [place]  [address] Phone - [phone]   Fax - [phone]  [facility] [address] [place], [place] [address] Phone - [phone]   Fax - [phone]  [facility] [address] [place], [place]  [address] Phone - [phone]   Fax - [phone]  [facility] [address] [place], [place]  [address] Phone - [phone]   Fax - [phone]  [facility] [address] [place], [place]  [address] Phone - [phone]   Fax - [phone]  [facility] [address], [address] [place], [place]  [address] Phone - [phone]   Fax - [phone]  [facility] [address], [address] [place], [place]  [address] Phone - [phone]   Fax - [phone]  [facility] [address],  [address] [place], [place]  [address] Phone - [phone]   Fax - [phone]  [facility] [address] [place], [place]  [address] Phone - [phone]   Fax - [phone] [facility] [address] [place], [place]  [address] Phone - [phone]   Fax - [phone]  [facility] [address] [place], [place]  [address] Phone - [phone]   Fax - [phone]  [facility] [address], [address] [place], [place]  [address] Phone - [phone]   Fax - [phone]  [facility] [address], [address] [place], [place]  [address] Phone - [phone]   Fax - [phone]  [facility] [address], [address] [place], [place]  [address] Phone - [phone]  [facility] [address] , [place]  [address] Phone - [phone]   Fax - [phone]  [facility] [address], [address] [place], [place]  [address] Phone - [phone]   Fax - [phone]  [facility] [address] [place], [place]  [address] Phone - [phone]   Fax - [phone]  [facility] [address] [place], [place]  [address] Phone - [phone]   Fax - [phone]   [facility] [address], [address] [place], [place]  [address] Phone - [phone]   Fax - [phone]  [facility] [address], [address] [place], [place]  [address] Phone - [phone]   Fax - [phone]  DA[facility]ID RUBIN [address], [address] [place], [place]  [address] Phone - [phone]   Fax - [phone]  [facility] [address], [address] [place], [place]  [address] Phone - [phone]   Fax - [phone]  [facility] [address] [place], [place]  [address] Phone - [phone]   Fax - [phone] [facility] [place] [address] [place], [place]  [address] Phone - [phone]   Fax - [phone]  [facility] [address] [place], [place]  [address] Phone - [phone]   Fax - [phone]  [facility] [place] [address], [address] [place], [place]  [address] Phone - [phone]   Fax - [phone]  [facility] [place] Wyvonne Lenzharlene Flemming MD [address] [place] [address] Phone [phone]  Fax [phone]   Doula Services    Natural Baby Doulas [email or web address] Tel: [phone] [email or web address]  [employer] Doulas [phone] [email or web address]  The Labor Merla RichesLadies  (also do waterbirth tub rental) [phone] [email or web address]  [facility] Doula [phone] [email or web address]  [facility]hyth[facility]  [phone] [email or web address]  [employer] Doula [employer] (PADA) [email or web address]  [email or web address]  [email or web address]

## 2017-10-03 NOTE — Progress Notes (Signed)
Subjective:   Michele Maldonado is a 31 y.o. [id number] at 6641w3d by LMP being seen today for her first obstetrical visit.  Her obstetrical history is significant for thrombocytopenia, previous c-section x 1. Patient does not intend to breast feed. Pregnancy history fully reviewed.  Patient reports no complaints.  HISTORY: Obstetric History   G6   P5   T4   P1   A0   L5    SAB0   TAB0   Ectopic0   Multiple0   Live Births5     # Outcome Date GA Lbr Len/2nd Weight Sex Delivery Anes PTL Lv  6 Current           5 Term 01/20/17 294w3d 11:59 / 00:06 9 lb 11 oz (4.394 kg) F VBAC EPI  LIV     Name: Whipkey,GIRL Fleur     Apgar1:  8                Apgar5: 9  4 Preterm 12/29/13 595w6d  4 lb 5 oz (1.956 kg) M CS-LTranv Spinal  LIV     Name: Seedorf,BOY Tamilyn     Apgar1:  9                Apgar5: 9  3 Term 04/07/10 5257w0d  9 lb (4.082 kg) M Vag-Spont EPI  LIV  2 Term 12/20/08 3757w0d  8 lb 4 oz (3.742 kg) M Vag-Spont EPI  LIV  1 Term 06/07/06 5857w0d  7 lb 14 oz (3.572 kg) F Vag-Spont EPI  LIV     Past Medical History:  Diagnosis Date  . MVP (mitral valve prolapse)   . Preterm labor    Past Surgical History:  Procedure Laterality Date  . CESAREAN SECTION N/A 12/29/2013   Performed by Allie Bossierove, Myra C, [facility]  . REPAIR VAGINAL CUFF N/A 12/30/2013   [facility]formed [facility] All[facility] Bossierove[facility] Myra C[facility]   Family History  Problem Relation Age of Onset  . Hypertension Maternal Grandmother   . Diabetes Maternal Grandmother    Social History   Tobacco Use  . Smoking status: Never Smoker  . Smokeless tobacco: Never Used  Substance Use Topics  . Alcohol use: No  . Drug use: No    Comment: former   No Known Allergies Current Outpatient Medications on File Prior to Visit  Medication Sig Dispense Refill  . Prenatal Vit-Fe Fumarate-FA (PRENATAL COMPLETE) 14-0.4 MG TABS Take 1 tablet by mouth daily after breakfast. 30 each 2   No current facility-administered medications on file prior to visit.       Exam   There were no vitals filed for this visit.    Uterus:     Pelvic Exam: Perineum: no hemorrhoids, normal perineum   Vulva: normal external genitalia, no lesions   Vagina:  normal mucosa, normal discharge   Cervix: no lesions and normal, pap smear done.    Adnexa: normal adnexa and no mass, fullness, tenderness   Bony Pelvis: average  System: General: well-developed, well-nourished female in no acute distress   Breast:  normal appearance, no masses or tenderness   Skin: normal coloration and turgor, no rashes   Neurologic: oriented, normal, negative, normal mood   Extremities: normal strength, tone, and muscle mass, ROM of all joints is normal   HEENT PERRLA, extraocular movement intact and sclera clear, anicteric   Mouth/Teeth mucous membranes moist, pharynx normal without lesions and dental hygiene good   Neck supple and no masses  Cardiovascular: regular rate and rhythm   Respiratory:  no respiratory distress, normal breath sounds   Abdomen: soft, non-tender; bowel sounds normal; no masses,  no organomegaly     Assessment:   Pregnancy: [id number] Patient Active Problem List   Diagnosis Date Noted  . History of nephrotic syndrome 10/26/2016  . Marijuana abuse 07/24/2016  . Previous cesarean delivery, antepartum 12/30/2013  . Thrombocytopenia (HCC) 12/27/2013     Plan:  1. Supervision of other normal pregnancy, antepartum - Routine care   2. Previous cesarean delivery, antepartum - Vag>Vag>Vag>C-section>VBAC - Plans VBAC  3. Previous pre-term delivery via c-section.  -patient was not in labor -csection for other reasons.  - Does not need 17-P  4. Thrombocytopenia -plts q trimester   Initial labs drawn. Started on ASA Flu vax declines today  Continue prenatal vitamins. Genetic Screening discussed, First trimester screen, Quad screen and NIPS: requested. Would like first screen. Spoke with MFM no appts available prior to patient being 13.6. Will plan  Quad screen at next visit.  Ultrasound discussed; fetal anatomic survey: requested. Problem list reviewed and updated. The [facility] with multiple MDs and other Advanced Practice Providers was explained to patient; also emphasized that residents, students are part of our team. Routine obstetric precautions reviewed. Return in about 4 weeks (around 10/31/2017).   Thressa ShellerHeather Hogan 2:56 PM 10/03/17

## 2017-10-03 NOTE — Progress Notes (Signed)
Pt went ER vaginal discharge/bleeding and prescribe antibiotic for seven. Pt wants the baby script.

## 2017-10-04 ENCOUNTER — Encounter: Payer: Self-pay | Admitting: Advanced Practice Midwife

## 2017-10-04 LAB — CERVICOVAGINAL ANCILLARY ONLY
Chlamydia: NEGATIVE
Neisseria Gonorrhea: NEGATIVE

## 2017-10-05 LAB — URINE CULTURE, OB REFLEX: ORGANISM ID, BACTERIA: NO GROWTH

## 2017-10-05 LAB — CULTURE, OB URINE

## 2017-10-11 LAB — OBSTETRIC PANEL, INCLUDING HIV
BASOS ABS: 0 10*3/uL (ref 0.0–0.2)
Basos: 0 %
EOS (ABSOLUTE): 0.1 10*3/uL (ref 0.0–0.4)
EOS: 2 %
HEP B S AG: NEGATIVE
HIV SCREEN 4TH GENERATION: NONREACTIVE
Hematocrit: 36.5 % (ref 34.0–46.6)
Hemoglobin: 12.2 g/dL (ref 11.1–15.9)
Immature Grans (Abs): 0 10*3/uL (ref 0.0–0.1)
Immature Granulocytes: 0 %
Lymphocytes Absolute: 2.8 10*3/uL (ref 0.7–3.1)
Lymphs: 32 %
MCH: 29.7 pg (ref 26.6–33.0)
MCHC: 33.4 g/dL (ref 31.5–35.7)
MCV: 89 fL (ref 79–97)
Monocytes Absolute: 0.7 10*3/uL (ref 0.1–0.9)
Monocytes: 8 %
NEUTROS ABS: 5.2 10*3/uL (ref 1.4–7.0)
Neutrophils: 58 %
PLATELETS: 195 10*3/uL (ref 150–379)
RBC: 4.11 x10E6/uL (ref 3.77–5.28)
RDW: 14.7 % (ref 12.3–15.4)
RH TYPE: POSITIVE
RPR: NONREACTIVE
Rubella Antibodies, IGG: 4.09 index (ref >0.99)
WBC: 8.7 10*3/uL (ref 3.4–10.8)

## 2017-10-11 LAB — AB SCR+ANTIBODY ID: Antibody Screen: POSITIVE — AB

## 2017-10-12 ENCOUNTER — Encounter: Payer: Self-pay | Admitting: Advanced Practice Midwife

## 2017-10-12 ENCOUNTER — Other Ambulatory Visit: Payer: Self-pay | Admitting: Advanced Practice Midwife

## 2017-10-12 DIAGNOSIS — Z3480 Encounter for supervision of other normal pregnancy, unspecified trimester: Secondary | ICD-10-CM

## 2017-10-13 ENCOUNTER — Other Ambulatory Visit: Payer: Self-pay | Admitting: General Practice

## 2017-10-13 ENCOUNTER — Telehealth: Payer: Self-pay | Admitting: General Practice

## 2017-10-13 DIAGNOSIS — R718 Other abnormality of red blood cells: Secondary | ICD-10-CM

## 2017-10-13 DIAGNOSIS — O34219 Maternal care for unspecified type scar from previous cesarean delivery: Secondary | ICD-10-CM

## 2017-10-13 NOTE — Telephone Encounter (Signed)
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 Scheduled with MFM along with detailed ultrasound per their recommendation for 12/21@ 8am. Called patient and discussed that blood work shows a specific type of antibody in her blood that is sometimes present due to blood transfusions. Explained sometimes this means baby can be anemic but that isn't a definite. Told patient all it means for her right now is a special detailed ultrasound which I have scheduled on 12/21 and that we may check on the antibody levels several times in her pregnancy. Explained the provider can answer any additional questions she has about this at her next OB appt. Patient verbalized understanding & had no questions

## 2017-10-14 ENCOUNTER — Encounter: Payer: Self-pay | Admitting: Obstetrics and Gynecology

## 2017-10-15 ENCOUNTER — Inpatient Hospital Stay (HOSPITAL_COMMUNITY)
Admission: AD | Admit: 2017-10-15 | Discharge: 2017-10-15 | Disposition: A | Discharge status: Home / Self Care | Payer: Medicaid Other | Source: Ambulatory Visit | Attending: Obstetrics and Gynecology | Admitting: Obstetrics and Gynecology

## 2017-10-15 ENCOUNTER — Encounter (HOSPITAL_COMMUNITY): Payer: Self-pay | Admitting: *Deleted

## 2017-10-15 DIAGNOSIS — R531 Weakness: Secondary | ICD-10-CM

## 2017-10-15 DIAGNOSIS — O26892 Other specified pregnancy related conditions, second trimester: Secondary | ICD-10-CM | POA: Diagnosis present

## 2017-10-15 DIAGNOSIS — I341 Nonrheumatic mitral (valve) prolapse: Secondary | ICD-10-CM | POA: Insufficient documentation

## 2017-10-15 DIAGNOSIS — R1030 Lower abdominal pain, unspecified: Secondary | ICD-10-CM | POA: Diagnosis present

## 2017-10-15 DIAGNOSIS — R109 Unspecified abdominal pain: Secondary | ICD-10-CM | POA: Diagnosis not present

## 2017-10-15 DIAGNOSIS — O99412 Diseases of the circulatory system complicating pregnancy, second trimester: Secondary | ICD-10-CM | POA: Diagnosis not present

## 2017-10-15 DIAGNOSIS — Z3A15 15 weeks gestation of pregnancy: Secondary | ICD-10-CM | POA: Insufficient documentation

## 2017-10-15 DIAGNOSIS — R42 Dizziness and giddiness: Secondary | ICD-10-CM | POA: Insufficient documentation

## 2017-10-15 DIAGNOSIS — O09212 Supervision of pregnancy with history of pre-term labor, second trimester: Secondary | ICD-10-CM | POA: Insufficient documentation

## 2017-10-15 DIAGNOSIS — Z3480 Encounter for supervision of other normal pregnancy, unspecified trimester: Secondary | ICD-10-CM

## 2017-10-15 LAB — URINALYSIS, ROUTINE W REFLEX MICROSCOPIC
Bilirubin Urine: NEGATIVE
Glucose, UA: NEGATIVE mg/dL
Hgb urine dipstick: NEGATIVE
KETONES UR: NEGATIVE mg/dL
LEUKOCYTES UA: NEGATIVE
Nitrite: NEGATIVE
PROTEIN: NEGATIVE mg/dL
Specific Gravity, Urine: 1.015 (ref 1.005–1.030)
pH: 7 (ref 5.0–8.0)

## 2017-10-15 LAB — RAPID URINE DRUG SCREEN, HOSP PERFORMED
Amphetamines: NOT DETECTED
BENZODIAZEPINES: NOT DETECTED
Barbiturates: NOT DETECTED
Cocaine: NOT DETECTED
Opiates: NOT DETECTED
Tetrahydrocannabinol: POSITIVE — AB

## 2017-10-15 LAB — GLUCOSE, CAPILLARY: GLUCOSE-CAPILLARY: 76 mg/dL (ref 65–99)

## 2017-10-15 NOTE — MAU Provider Note (Signed)
History   [id number] @ 15.1 wks in with sharp low abd pain on both sides, and feeling week since last night. Denies vag bleeding prev c/s x 1 with successful VBAC with delivery.   CSN: [id number]  Arrival date & time 10/15/17  0950   None     Chief Complaint  Patient presents with  . Abdominal Pain  . Dizziness    HPI  Past Medical History:  Diagnosis Date  . MVP (mitral valve prolapse)   . Preterm labor     Past Surgical History:  Procedure Laterality Date  . CESAREAN SECTION N/A 12/29/2013   Procedure: CESAREAN SECTION;  Surgeon: Allie BossierMyra C Dove, MD;  Location: [facility];  Service: Obstetrics;  Laterality: N/A;  . REPAIR VAGINAL CUFF N/A 12/30/2013   Procedure: REPAIR VAGINAL CUFF;  Surgeon: Allie BossierMyra C Dove, MD;  Location: [facility];  Service: Gynecology;  Laterality: N/A;  Insertion of Bakri Balloon    Family History  Problem Relation Age of Onset  . Hypertension Maternal Grandmother   . Diabetes Maternal Grandmother   . Thyroid disease Mother     Social History   Tobacco Use  . Smoking status: Never Smoker  . Smokeless tobacco: Never Used  Substance Use Topics  . Alcohol use: No  . Drug use: No    Comment: former    OB History    Gravida Para Term Preterm AB Living   6 5 4 1  0 5   SAB TAB Ectopic Multiple Live Births   0 0 0 0 5      Review of Systems  Constitutional: Negative.   HENT: Negative.   Eyes: Negative.   Respiratory: Negative.   Cardiovascular: Negative.   Gastrointestinal: Negative.   Endocrine: Negative.   Genitourinary: Negative.   Musculoskeletal: Negative.   Skin: Negative.   Allergic/Immunologic: Negative.   Neurological: Negative.   Hematological: Negative.   Psychiatric/Behavioral: Negative.     Allergies  Patient has no known allergies.  Home Medications    BP (!) 103/55   Pulse 77   Temp 98.6 F (37 C) (Oral)   Resp 18   LMP 07/05/2017   SpO2 100%   Physical Exam  Constitutional: She is oriented to person, place, and time.  She appears well-developed and well-nourished.  HENT:  Head: Normocephalic.  Neck: Normal range of motion.  Cardiovascular: Normal rate, regular rhythm, normal heart sounds and intact distal pulses.  Pulmonary/Chest: Effort normal and breath sounds normal.  Abdominal: Soft. Bowel sounds are normal.  Genitourinary: Vagina normal and uterus normal.  Musculoskeletal: Normal range of motion.  Neurological: She is alert and oriented to person, place, and time. She has normal reflexes.  Skin: Skin is warm and dry.  Psychiatric: She has a normal mood and affect. Her behavior is normal. Judgment and thought content normal.    MAU Course  Procedures (including critical care time)  Labs Reviewed  URINALYSIS, ROUTINE W REFLEX MICROSCOPIC  GLUCOSE, CAPILLARY   No results found. Results for orders placed or performed during the hospital encounter of 10/15/17 (from the past 24 hour(s))  Urinalysis, Routine w reflex microscopic     Status: None   Collection Time: 10/15/17  9:52 AM  Result Value Ref Range   Color, Urine YELLOW YELLOW   APPearance CLEAR CLEAR   Specific Gravity, Urine 1.015 1.005 - 1.030   pH 7.0 5.0 - 8.0   Glucose, UA NEGATIVE NEGATIVE mg/dL   Hgb urine dipstick NEGATIVE NEGATIVE   Bilirubin  Urine NEGATIVE NEGATIVE   Ketones, ur NEGATIVE NEGATIVE mg/dL   Protein, ur NEGATIVE NEGATIVE mg/dL   Nitrite NEGATIVE NEGATIVE   Leukocytes, UA NEGATIVE NEGATIVE  Glucose, capillary     Status: None   Collection Time: 10/15/17 10:15 AM  Result Value Ref Range   Glucose-Capillary 76 65 - 99 mg/dL    1. Abdominal pain in pregnancy, second trimester   2. Weakness       MDM  Normal discomforts of preg. CBG 76, VSS, FHR 136 st and reg per doppler. Alert and oriented x 3. Will d/c home in stable condition. Discussed importance of sm freq high protein meals and snacks.

## 2017-10-15 NOTE — Discharge Instructions (Signed)

## 2017-10-15 NOTE — MAU Note (Signed)
Patient states she started having intermittent sharp LLQ abdominal pains last night and feeling dizzy this morning.  Patient denies vaginal bleeding or abnormal discharge.  Patient has not had anything to eat or drink this morning. Had a normal BM this AM.

## 2017-10-16 ENCOUNTER — Encounter (HOSPITAL_COMMUNITY): Payer: Self-pay

## 2017-10-16 ENCOUNTER — Emergency Department (HOSPITAL_COMMUNITY)
Admission: EM | Admit: 2017-10-16 | Discharge: 2017-10-16 | Disposition: A | Discharge status: Home / Self Care | Payer: Medicaid Other | Attending: Emergency Medicine | Admitting: Emergency Medicine

## 2017-10-16 DIAGNOSIS — R102 Pelvic and perineal pain: Secondary | ICD-10-CM | POA: Diagnosis not present

## 2017-10-16 DIAGNOSIS — Z3A15 15 weeks gestation of pregnancy: Secondary | ICD-10-CM | POA: Insufficient documentation

## 2017-10-16 DIAGNOSIS — O4692 Antepartum hemorrhage, unspecified, second trimester: Secondary | ICD-10-CM | POA: Diagnosis not present

## 2017-10-16 DIAGNOSIS — N930 Postcoital and contact bleeding: Secondary | ICD-10-CM

## 2017-10-16 HISTORY — DX: Encounter for supervision of normal pregnancy, unspecified, unspecified trimester: Z34.90

## 2017-10-16 LAB — CBC WITH DIFFERENTIAL/PLATELET
BASOS ABS: 0 10*3/uL (ref 0.0–0.1)
BASOS PCT: 0 %
EOS PCT: 1 %
Eosinophils Absolute: 0.1 10*3/uL (ref 0.0–0.7)
HCT: 34.8 % — ABNORMAL LOW (ref 36.0–46.0)
Hemoglobin: 11.9 g/dL — ABNORMAL LOW (ref 12.0–15.0)
LYMPHS PCT: 28 %
Lymphs Abs: 3.3 10*3/uL (ref 0.7–4.0)
MCH: 30.3 pg (ref 26.0–34.0)
MCHC: 34.2 g/dL (ref 30.0–36.0)
MCV: 88.5 fL (ref 78.0–100.0)
MONO ABS: 0.6 10*3/uL (ref 0.1–1.0)
Monocytes Relative: 5 %
Neutro Abs: 7.9 10*3/uL — ABNORMAL HIGH (ref 1.7–7.7)
Neutrophils Relative %: 66 %
PLATELETS: 185 10*3/uL (ref 150–400)
RBC: 3.93 MIL/uL (ref 3.87–5.11)
RDW: 14.6 % (ref 11.5–15.5)
WBC: 12 10*3/uL — AB (ref 4.0–10.5)

## 2017-10-16 LAB — HCG, QUANTITATIVE, PREGNANCY: HCG, BETA CHAIN, QUANT, S: 87695 m[IU]/mL — AB (ref <5)

## 2017-10-16 NOTE — ED Triage Notes (Signed)
[facility]. This am reports bloody discharge/spotting when she wipes, no cramping, NAD. [redacted] weeks pregnant G6, P5

## 2017-10-16 NOTE — ED Provider Notes (Signed)
[facility] Provider Note   CSN: [id number] Arrival date & time: 10/16/17  1447     History   Chief Complaint No chief complaint on file.   HPI Michele Maldonado is a 31 y.o. [phone] @ 2372w2d gestation who presents to the ED with spotting that started this am. [facility] and was d/c home. Patient reports [facility] so she came here. Patient denies cramping, fever, chills or other problems. Blood type B+. Patient has had left side abdominal pain off and on for the past few weeks and that has not changed.   HPI  Past Medical History:  Diagnosis Date  . MVP (mitral valve prolapse)   . Pregnant   . Preterm labor     Patient Active Problem List   Diagnosis Date Noted  . Supervision of other normal pregnancy, antepartum 10/03/2017  . History of nephrotic syndrome 10/26/2016  . Marijuana abuse 07/24/2016  . Previous cesarean delivery, antepartum 12/30/2013  . Thrombocytopenia (HCC) 12/27/2013    Past Surgical History:  Procedure Laterality Date  . CESAREAN SECTION N/A 12/29/2013   Procedure: CESAREAN SECTION;  Surgeon: Allie BossierMyra C Dove, MD;  Location[facility];  Service: Obstetrics;  Laterality: N/A;  . REPAIR VAGINAL CUFF N/A 12/30/2013   Procedure: REPAIR VAGINAL CUFF;  Surgeon: Allie BossierMyra C Dove, MD;  Location: [facility];  Service: Gynecology;  Laterality: N/A;  Insertion of Bakri Balloon    OB History    Gravida Para Term Preterm AB Living   6 5 4 1  0 5   SAB TAB Ectopic Multiple Live Births   0 0 0 0 5       Home Medications    Prior to Admission medications   Medication Sig Start Date End Date Taking? Authorizing Provider  aspirin EC 81 MG tablet Take 1 tablet (81 mg total) daily by mouth. 10/03/17   Armando ReichertHogan, Heather D, CNM  Prenatal Vit-Fe Fumarate-FA (PRENATAL COMPLETE) 14-0.4 MG TABS Take 1 tablet by mouth daily after breakfast. 09/10/17   Street,  ClevelandMercedes, PA-C    Family History Family History  Problem Relation Age of Onset  . Hypertension Maternal Grandmother   . Diabetes Maternal Grandmother   . Thyroid disease Mother     Social History Social History   Tobacco Use  . Smoking status: Never Smoker  . Smokeless tobacco: Never Used  Substance Use Topics  . Alcohol use: No  . Drug use: No    Comment: former     Allergies   Patient has no known allergies.   Review of Systems Review of Systems  Constitutional: Negative for chills and fever.  HENT: Sore throat: scratchy.   Eyes: Negative for visual disturbance.  Respiratory: Negative for shortness of breath.   Cardiovascular: Negative for chest pain.  Gastrointestinal: Negative for nausea and vomiting. Abdominal pain: left side.  Genitourinary: Positive for vaginal bleeding. Negative for dysuria, urgency and vaginal discharge.  Musculoskeletal: Negative for back pain and neck pain.  Skin: Negative for rash.  Neurological: Negative for dizziness, syncope and headaches.  Psychiatric/Behavioral: Negative for confusion.     Physical Exam Updated Vital Signs BP (!) 108/53 (BP Location: Right Arm)   Pulse 79   Temp 98.9 F (37.2 C) (Oral)   Resp 16   LMP 07/05/2017   SpO2 99%   Physical Exam  Constitutional: She is oriented to person, place, and time.  She appears well-developed and well-nourished. No distress.  HENT:  Head: Normocephalic and atraumatic.  Eyes: EOM are normal.  Neck: Normal range of motion. Neck supple.  Cardiovascular: Normal rate and regular rhythm.  Pulmonary/Chest: Effort normal and breath sounds normal.  Abdominal: Soft. Bowel sounds are normal. There is tenderness (left side). There is no rebound and no guarding.  FHT's 145 Tenderness to abdomen is mild.  Genitourinary:  Genitourinary Comments: External genitalia without lesions, small blood vaginal vault.  Cervix fingertip, thick, high. Uterus consistent with dates.     Musculoskeletal: Normal range of motion.  Neurological: She is alert and oriented to person, place, and time. No cranial nerve deficit.  Skin: Skin is warm and dry.  Psychiatric: She has a normal mood and affect. Her behavior is normal.  Nursing note and vitals reviewed.    ED Treatments / Results  Labs (all labs ordered are listed, but only abnormal results are displayed) Labs Reviewed  HCG, QUANTITATIVE, PREGNANCY - Abnormal; Notable for the following components:      Result Value   hCG, Beta Chain, Quant, [name] (*)    All other components within normal limits  CBC WITH DIFFERENTIAL/PLATELET - Abnormal; Notable for the following components:   WBC 12.0 (*)    Hemoglobin 11.9 (*)    HCT 34.8 (*)    Neutro Abs 7.9 (*)    All other components within normal limits  GC/CHLAMYDIA PROBE AMP ([place]) NOT AT [facility]     Radiology No results found.  Procedures Procedures (including critical care time)  Medications Ordered in ED Medications - No data to display   Initial Impression / Assessment and Plan / ED Course  I have reviewed the triage vital signs and the nursing notes. 31 y.o. [id number] @ 4870w2d gestation with vaginal bleeding after intercourse stable for d/c without heavy bleeding or signs of impending SAB. [facility].   Final Clinical Impressions(s) / ED Diagnoses   Final diagnoses:  PCB (post coital bleeding)  Vaginal bleeding in pregnancy, second trimester    ED Discharge Orders    None       Kerrie Buffaloeese, Arita Severtson FrancisM, NP 10/16/17 1857    Maia PlanLong, Joshua G, MD 10/17/17 1001

## 2017-10-16 NOTE — Discharge Instructions (Signed)
[facility]

## 2017-10-17 ENCOUNTER — Other Ambulatory Visit: Payer: Medicaid Other

## 2017-10-17 ENCOUNTER — Other Ambulatory Visit: Payer: Self-pay | Admitting: Advanced Practice Midwife

## 2017-10-17 DIAGNOSIS — Z1379 Encounter for other screening for genetic and chromosomal anomalies: Secondary | ICD-10-CM

## 2017-10-17 NOTE — Progress Notes (Signed)
Patient is here panaroma.   Michele ShellerHeather Shanel Maldonado 9:12 AM 10/17/17

## 2017-10-17 NOTE — Progress Notes (Signed)
Panorama sent per patient request- [employer] ex Pick up# [id number]. Panorama collection kit [phone]

## 2017-10-18 LAB — GC/CHLAMYDIA PROBE AMP (~~LOC~~) NOT AT ARMC
CHLAMYDIA, DNA PROBE: NEGATIVE
Neisseria Gonorrhea: NEGATIVE

## 2017-10-27 ENCOUNTER — Encounter (HOSPITAL_COMMUNITY): Payer: Self-pay | Admitting: Obstetrics and Gynecology

## 2017-10-31 ENCOUNTER — Ambulatory Visit (INDEPENDENT_AMBULATORY_CARE_PROVIDER_SITE_OTHER): Payer: Medicaid Other | Admitting: Medical

## 2017-10-31 ENCOUNTER — Encounter: Payer: Self-pay | Admitting: Medical

## 2017-10-31 VITALS — BP 103/55 | HR 81 | Wt 181.8 lb

## 2017-10-31 DIAGNOSIS — O361920 Maternal care for other isoimmunization, second trimester, not applicable or unspecified: Secondary | ICD-10-CM

## 2017-10-31 DIAGNOSIS — O34219 Maternal care for unspecified type scar from previous cesarean delivery: Secondary | ICD-10-CM

## 2017-10-31 DIAGNOSIS — Z3480 Encounter for supervision of other normal pregnancy, unspecified trimester: Secondary | ICD-10-CM

## 2017-10-31 DIAGNOSIS — D696 Thrombocytopenia, unspecified: Secondary | ICD-10-CM

## 2017-10-31 NOTE — Progress Notes (Signed)
   PRENATAL VISIT NOTE  Subjective:  Michele Maldonado is a 31 y.o. [phone] at 376w3d being seen today for ongoing prenatal care.  She is currently monitored for the following issues for this low-risk pregnancy and has Thrombocytopenia (HCC); Previous cesarean delivery, antepartum; Marijuana abuse; History of nephrotic syndrome; and Supervision of other normal pregnancy, antepartum on their problem list.  Patient reports no complaints.  Contractions: Not present. Vag. Bleeding: None.  Movement: Absent. Denies leaking of fluid.   The following portions of the patient's history were reviewed and updated as appropriate: allergies, current medications, past family history, past medical history, past social history, past surgical history and problem list. Problem list updated.  Objective:   Vitals:   10/31/17 0920  BP: (!) 103/55  Pulse: 81  Weight: 181 lb 12.8 oz (82.5 kg)    Fetal Status: Fetal Heart Rate (bpm): 134   Movement: Absent     General:  Alert, oriented and cooperative. Patient is in no acute distress.  Skin: Skin is warm and dry. No rash noted.   Cardiovascular: Normal heart rate noted  Respiratory: Normal respiratory effort, no problems with respiration noted  Abdomen: Soft, gravid, appropriate for gestational age.  Pain/Pressure: Absent     Pelvic: Cervical exam deferred        Extremities: Normal range of motion.  Edema: None  Mental Status:  Normal mood and affect. Normal behavior. Normal judgment and thought content.   Assessment and Plan:  Pregnancy: [id number] at 6876w3d  1. Maternal atypical antibody affecting pregnancy in second trimester, single or unspecified fetus - AMB referral to maternal fetal medicine - Anti Kell antibody   2. Supervision of other normal pregnancy, antepartum - Doing well, no complaints  3. Thrombocytopenia (HCC) - Last checked 12/2 was normal - Plan to recheck at 35 weeks  4. Previous cesarean delivery, antepartum - Has had VBAC since  C/S - [facility], will sign consent at 28 weeks  Preterm labor/second trimester warning symptoms and general obstetric precautions including but not limited to vaginal bleeding, contractions, leaking of fluid and fetal movement were reviewed in detail with the patient. Please refer to After Visit Summary for other counseling recommendations.  Return in about 4 weeks (around 11/28/2017) for LOB.   Vonzella NippleJulie Bayan Kushnir, PA-C

## 2017-10-31 NOTE — Patient Instructions (Signed)
Second Trimester of Pregnancy The second trimester is from week 13 through week 28, month 4 through 6. This is often the time in pregnancy that you feel your best. Often times, morning sickness has lessened or quit. You may have more energy, and you may get hungry more often. Your unborn baby (fetus) is growing rapidly. At the end of the sixth month, he or she is about 9 inches long and weighs about 1 pounds. You will likely feel the baby move (quickening) between 18 and 20 weeks of pregnancy. Follow these instructions at home:  Avoid all smoking, herbs, and alcohol. Avoid drugs not approved by your doctor.  Do not use any tobacco products, including cigarettes, chewing tobacco, and electronic cigarettes. If you need help quitting, ask your doctor. You may get counseling or other support to help you quit.  Only take medicine as told by your doctor. Some medicines are safe and some are not during pregnancy.  Exercise only as told by your doctor. Stop exercising if you start having cramps.  Eat regular, healthy meals.  Wear a good support bra if your breasts are tender.  Do not use hot tubs, steam rooms, or saunas.  Wear your seat belt when driving.  Avoid raw meat, uncooked cheese, and liter boxes and soil used by cats.  Take your prenatal vitamins.  Take 1500-2000 milligrams of calcium daily starting at the 20th week of pregnancy until you deliver your baby.  Try taking medicine that helps you poop (stool softener) as needed, and if your doctor approves. Eat more fiber by eating fresh fruit, vegetables, and whole grains. Drink enough fluids to keep your pee (urine) clear or pale yellow.  Take warm water baths (sitz baths) to soothe pain or discomfort caused by hemorrhoids. Use hemorrhoid cream if your doctor approves.  If you have puffy, bulging veins (varicose veins), wear support hose. Raise (elevate) your feet for 15 minutes, 3-4 times a day. Limit salt in your diet.  Avoid heavy  lifting, wear low heals, and sit up straight.  Rest with your legs raised if you have leg cramps or low back pain.  Visit your dentist if you have not gone during your pregnancy. Use a soft toothbrush to brush your teeth. Be gentle when you floss.  You can have sex (intercourse) unless your doctor tells you not to.  Go to your doctor visits. Get help if:  You feel dizzy.  You have mild cramps or pressure in your lower belly (abdomen).  You have a nagging pain in your belly area.  You continue to feel sick to your stomach (nauseous), throw up (vomit), or have watery poop (diarrhea).  You have bad smelling fluid coming from your vagina.  You have pain with peeing (urination). Get help right away if:  You have a fever.  You are leaking fluid from your vagina.  You have spotting or bleeding from your vagina.  You have severe belly cramping or pain.  You lose or gain weight rapidly.  You have trouble catching your breath and have chest pain.  You notice sudden or extreme puffiness (swelling) of your face, hands, ankles, feet, or legs.  You have not felt the baby move in over an hour.  You have severe headaches that do not go away with medicine.  You have vision changes. This information is not intended to replace advice given to you by your health care provider. Make sure you discuss any questions you have with your health care   provider. Document Released: 01/26/2010 Document Revised: 04/08/2016 Document Reviewed: 01/02/2013 Elsevier Interactive Patient Education  2017 [employer]  

## 2017-11-03 ENCOUNTER — Encounter (HOSPITAL_COMMUNITY): Payer: Self-pay

## 2017-11-03 ENCOUNTER — Ambulatory Visit (HOSPITAL_COMMUNITY)
Admission: RE | Admit: 2017-11-03 | Discharge: 2017-11-03 | Disposition: A | Discharge status: Home / Self Care | Payer: Medicaid Other | Source: Ambulatory Visit | Attending: Medical | Admitting: Medical

## 2017-11-03 DIAGNOSIS — Z3A17 17 weeks gestation of pregnancy: Secondary | ICD-10-CM | POA: Insufficient documentation

## 2017-11-03 DIAGNOSIS — O360120 Maternal care for anti-D [Rh] antibodies, second trimester, not applicable or unspecified: Secondary | ICD-10-CM | POA: Insufficient documentation

## 2017-11-03 NOTE — Consult Note (Signed)
MATERNAL FETAL MEDICINE CONSULT  Patient Name: Kalman ShanRavett Outten Medical Record Number:  [id number] Date of Birth: 30-Jan-1986 Requesting Physician Name:  Kathlene CoteWenzel, Julie N, PA-C Date of Service: 11/03/2017  Chief Complaint Anti-Kell alloimmunization  History of Present Illness Jamala Roxan HockeyRobinson is a 31 y.o. 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  Her antibody screen returned an anti-Kell antibody that was too weak to titer.  She has has had 5 prior deliveries, the last 2 where with the same father as her current pregnancy.  Her first 3 pregnancies were with another man.  During her last delivery she required a blood transfusion due to a postpartum hemorrhage.  She has no acute issues or concerns today.  Review of Systems Pertinent items are noted in HPI.  Patient History OB History  Gravida Para Term Preterm AB Living  6 5 4 1  0 5  SAB TAB Ectopic Multiple Live Births  0 0 0 0 5    # Outcome Date GA Lbr Len/2nd Weight Sex Delivery Anes PTL Lv  6 Current           5 Term 01/20/17 2546w3d 11:59 / 00:06 9 lb 11 oz (4.394 kg) F VBAC EPI  LIV  4 Preterm 12/29/13 4748w6d  4 lb 5 oz (1.956 kg) M CS-LTranv Spinal  LIV  3 Term 04/07/10 7822w0d  9 lb (4.082 kg) M Vag-Spont EPI  LIV  2 Term 12/20/08 8522w0d  8 lb 4 oz (3.742 kg) M Vag-Spont EPI  LIV  1 Term 06/07/06 2322w0d  7 lb 14 oz (3.572 kg) F Vag-Spont EPI  LIV      Past Medical History:  Diagnosis Date  . MVP (mitral valve prolapse)   . Pregnant   . Preterm labor     Past Surgical History:  Procedure Laterality Date  . CESAREAN SECTION N/A 12/29/2013   Procedure: CESAREAN SECTION;  Surgeon: Allie BossierMyra C Dove, MD;  Location: [facility];  Service: Obstetrics;  Laterality: N/A;  . REPAIR VAGINAL CUFF N/A 12/30/2013   Procedure: REPAIR VAGINAL CUFF;  Surgeon: Allie BossierMyra C Dove, MD;  Location: [facility];  Service: Gynecology;  Laterality: N/A;  Insertion of Bakri Balloon    Social History   Socioeconomic  History  . Marital status: Single    Spouse name: None  . Number of children: None  . Years of education: None  . Highest education level: None  Social Needs  . Financial resource strain: None  . Food insecurity - worry: None  . Food insecurity - inability: None  . Transportation needs - medical: None  . Transportation needs - non-medical: None  Occupational History  . None  Tobacco Use  . Smoking status: Never Smoker  . Smokeless tobacco: Never Used  Substance and Sexual Activity  . Alcohol use: No  . Drug use: No    Comment: former  . Sexual activity: Yes    Birth control/protection: None  Other Topics Concern  . None  Social History Narrative  . None    Family History  Problem Relation Age of Onset  . Hypertension Maternal Grandmother   . Diabetes Maternal Grandmother   . Thyroid disease Mother    In addition, the patient has no family history of mental retardation, birth defects, or genetic diseases.  Physical Examination Vitals:   11/03/17 0831  BP: 99/62  Pulse: 73   General appearance - alert, well appearing, and in no distress Mental status - alert, oriented to  person, place, and time  Assessment and Recommendations 1.  Anti-Kell alloimmunization.  Ms. Roxan HockeyRobinson has anti-Kell antibodies that were too weak to titer.  We discussed the nature of alloimmunization, the potential for significant fetal anemia, paternal and fetal genotyping, the monitoring for anemia using MCA doppler, and the treatment of alloimmunization with fetal transfusion.  As she has anti-Kell antibodies, where antibody titer levels do not correlate with the risk of anemia, she should start MCA doppler assessment with her anatomy scan which is scheduled for tomorrow.  Ms. Roxan HockeyRobinson will bring the father of the baby with her to her ultrasound appointment tomorrow so his blood can be [facility] to [facility] for Kell genotyping.  If the father of the baby is Kell negative Ms. Roxan HockeyRobinson  should return to routine prenatal care.  If homozygous positive for Kell then the fetus should be assumed to be Kell positive.  If he is heterozygous for the Kell gene, there is a 50% chance the baby will also be Kell positive.  In that case an amniocentesis can be performed to confirm the fetal genotype.  If the fetus is suspected or confirmed to be Kell positive she should also begin once or twice weekly fetal testing beginning at 32 weeks, or earlier if evidence of anemia is discovered.  I spent 30 minutes with Ms. Roxan HockeyRobinson today of which 50% was face-to-face counseling.  Thank you for referring [facility].  Please do not hesitate to contact us with questions.   Rema FendtNITSCHE,Stina Gane, MD

## 2017-11-04 ENCOUNTER — Ambulatory Visit (HOSPITAL_COMMUNITY)
Admission: RE | Admit: 2017-11-04 | Discharge: 2017-11-04 | Disposition: A | Discharge status: Home / Self Care | Payer: Medicaid Other | Source: Ambulatory Visit | Attending: Obstetrics and Gynecology | Admitting: Obstetrics and Gynecology

## 2017-11-04 ENCOUNTER — Other Ambulatory Visit: Payer: Self-pay | Admitting: Obstetrics and Gynecology

## 2017-11-04 ENCOUNTER — Encounter (HOSPITAL_COMMUNITY): Payer: Self-pay

## 2017-11-04 ENCOUNTER — Other Ambulatory Visit (HOSPITAL_COMMUNITY): Payer: Self-pay | Admitting: *Deleted

## 2017-11-04 DIAGNOSIS — O361920 Maternal care for other isoimmunization, second trimester, not applicable or unspecified: Secondary | ICD-10-CM

## 2017-11-04 DIAGNOSIS — D696 Thrombocytopenia, unspecified: Secondary | ICD-10-CM

## 2017-11-04 DIAGNOSIS — Z3689 Encounter for other specified antenatal screening: Secondary | ICD-10-CM | POA: Insufficient documentation

## 2017-11-04 DIAGNOSIS — Z3A17 17 weeks gestation of pregnancy: Secondary | ICD-10-CM

## 2017-11-04 DIAGNOSIS — O34219 Maternal care for unspecified type scar from previous cesarean delivery: Secondary | ICD-10-CM

## 2017-11-04 DIAGNOSIS — R718 Other abnormality of red blood cells: Secondary | ICD-10-CM | POA: Diagnosis present

## 2017-11-18 ENCOUNTER — Ambulatory Visit (HOSPITAL_COMMUNITY)
Admission: RE | Admit: 2017-11-18 | Discharge: 2017-11-18 | Disposition: A | Discharge status: Home / Self Care | Payer: Medicaid Other | Source: Ambulatory Visit | Attending: Obstetrics and Gynecology | Admitting: Obstetrics and Gynecology

## 2017-11-18 ENCOUNTER — Encounter (HOSPITAL_COMMUNITY): Payer: Self-pay

## 2017-11-18 ENCOUNTER — Other Ambulatory Visit (HOSPITAL_COMMUNITY): Payer: Self-pay | Admitting: *Deleted

## 2017-11-18 DIAGNOSIS — Z3A19 19 weeks gestation of pregnancy: Secondary | ICD-10-CM | POA: Insufficient documentation

## 2017-11-18 DIAGNOSIS — O361920 Maternal care for other isoimmunization, second trimester, not applicable or unspecified: Secondary | ICD-10-CM | POA: Diagnosis present

## 2017-11-18 DIAGNOSIS — O321XX0 Maternal care for breech presentation, not applicable or unspecified: Secondary | ICD-10-CM | POA: Insufficient documentation

## 2017-11-28 ENCOUNTER — Encounter: Payer: Self-pay | Admitting: Advanced Practice Midwife

## 2017-12-01 ENCOUNTER — Encounter (HOSPITAL_COMMUNITY): Payer: Self-pay

## 2017-12-02 ENCOUNTER — Other Ambulatory Visit (HOSPITAL_COMMUNITY): Payer: Self-pay | Admitting: *Deleted

## 2017-12-02 ENCOUNTER — Encounter (HOSPITAL_COMMUNITY): Payer: Self-pay

## 2017-12-02 ENCOUNTER — Ambulatory Visit (HOSPITAL_COMMUNITY)
Admission: RE | Admit: 2017-12-02 | Discharge: 2017-12-02 | Disposition: A | Discharge status: Home / Self Care | Payer: Medicaid Other | Source: Ambulatory Visit | Attending: Obstetrics and Gynecology | Admitting: Obstetrics and Gynecology

## 2017-12-02 DIAGNOSIS — Z3A21 21 weeks gestation of pregnancy: Secondary | ICD-10-CM | POA: Insufficient documentation

## 2017-12-02 DIAGNOSIS — O361920 Maternal care for other isoimmunization, second trimester, not applicable or unspecified: Secondary | ICD-10-CM

## 2017-12-02 HISTORY — DX: Thrombocytopenia, unspecified: D69.6

## 2017-12-06 ENCOUNTER — Encounter: Payer: Self-pay | Admitting: General Practice

## 2017-12-06 ENCOUNTER — Ambulatory Visit (INDEPENDENT_AMBULATORY_CARE_PROVIDER_SITE_OTHER): Payer: Medicaid Other

## 2017-12-06 VITALS — BP 113/60 | HR 88 | Wt 195.0 lb

## 2017-12-06 DIAGNOSIS — O361920 Maternal care for other isoimmunization, second trimester, not applicable or unspecified: Secondary | ICD-10-CM

## 2017-12-06 DIAGNOSIS — Z3480 Encounter for supervision of other normal pregnancy, unspecified trimester: Secondary | ICD-10-CM

## 2017-12-06 DIAGNOSIS — Z3482 Encounter for supervision of other normal pregnancy, second trimester: Secondary | ICD-10-CM

## 2017-12-06 NOTE — Patient Instructions (Signed)

## 2017-12-06 NOTE — Progress Notes (Signed)
   PRENATAL VISIT NOTE  Subjective:  Michele Maldonado is a 32 y.o. [phone] at 5219w0d being seen today for ongoing prenatal care.  She is currently monitored for the following issues for this low-risk pregnancy and has Thrombocytopenia (HCC); Previous cesarean delivery, antepartum; Marijuana abuse; History of nephrotic syndrome; and Supervision of other normal pregnancy, antepartum on their problem list.  Patient reports no complaints.  Contractions: Not present. Vag. Bleeding: None.  Movement: Present. Denies leaking of fluid.   The following portions of the patient's history were reviewed and updated as appropriate: allergies, current medications, past family history, past medical history, past social history, past surgical history and problem list. Problem list updated.  Objective:   Vitals:   12/06/17 1148  BP: 113/60  Pulse: 88  Weight: 195 lb (88.5 kg)    Fetal Status: Fetal Heart Rate (bpm): 147 Fundal Height: 22 cm Movement: Present     General:  Alert, oriented and cooperative. Patient is in no acute distress.  Skin: Skin is warm and dry. No rash noted.   Cardiovascular: Normal heart rate noted  Respiratory: Normal respiratory effort, no problems with respiration noted  Abdomen: Soft, gravid, appropriate for gestational age.  Pain/Pressure: Absent     Pelvic: Cervical exam deferred        Extremities: Normal range of motion.  Edema: None  Mental Status:  Normal mood and affect. Normal behavior. Normal judgment and thought content.   Assessment and Plan:  Pregnancy: [id number] at 119w0d  1. Supervision of other normal pregnancy, antepartum - Routine care - Patient requesting a note for work due to weekly dopplers being done at [facility]- note given  2. Maternal atypical antibody affecting pregnancy in second trimester, single or unspecified fetus - Weekly dopplers, normal last week. Next visit 1/25  Preterm labor symptoms and general obstetric precautions including but not limited  to vaginal bleeding, contractions, leaking of fluid and fetal movement were reviewed in detail with the patient. Please refer to After Visit Summary for other counseling recommendations.  Return in about 4 weeks (around 01/03/2018) for Return OB visit, 2hr GTT and labs.   Rolm BookbinderCaroline M Desha Bitner, CNM 12/06/17 12:00 PM

## 2017-12-09 ENCOUNTER — Ambulatory Visit (HOSPITAL_COMMUNITY)
Admission: RE | Admit: 2017-12-09 | Discharge: 2017-12-09 | Disposition: A | Discharge status: Home / Self Care | Payer: Medicaid Other | Source: Ambulatory Visit | Attending: Obstetrics and Gynecology | Admitting: Obstetrics and Gynecology

## 2017-12-09 ENCOUNTER — Encounter (HOSPITAL_COMMUNITY): Payer: Self-pay

## 2017-12-09 ENCOUNTER — Other Ambulatory Visit (HOSPITAL_COMMUNITY): Payer: Self-pay | Admitting: Maternal and Fetal Medicine

## 2017-12-09 DIAGNOSIS — Z3480 Encounter for supervision of other normal pregnancy, unspecified trimester: Secondary | ICD-10-CM

## 2017-12-09 DIAGNOSIS — R718 Other abnormality of red blood cells: Secondary | ICD-10-CM

## 2017-12-09 DIAGNOSIS — Z3A22 22 weeks gestation of pregnancy: Secondary | ICD-10-CM | POA: Diagnosis not present

## 2017-12-09 DIAGNOSIS — O361920 Maternal care for other isoimmunization, second trimester, not applicable or unspecified: Secondary | ICD-10-CM

## 2017-12-16 ENCOUNTER — Ambulatory Visit (HOSPITAL_COMMUNITY)
Admission: RE | Admit: 2017-12-16 | Discharge: 2017-12-16 | Disposition: A | Discharge status: Home / Self Care | Payer: Medicaid Other | Source: Ambulatory Visit | Attending: Obstetrics and Gynecology | Admitting: Obstetrics and Gynecology

## 2017-12-16 ENCOUNTER — Encounter (HOSPITAL_COMMUNITY): Payer: Self-pay

## 2017-12-16 DIAGNOSIS — Z3480 Encounter for supervision of other normal pregnancy, unspecified trimester: Secondary | ICD-10-CM

## 2017-12-16 DIAGNOSIS — Z3A23 23 weeks gestation of pregnancy: Secondary | ICD-10-CM | POA: Insufficient documentation

## 2017-12-16 DIAGNOSIS — O361920 Maternal care for other isoimmunization, second trimester, not applicable or unspecified: Secondary | ICD-10-CM | POA: Insufficient documentation

## 2017-12-16 DIAGNOSIS — R718 Other abnormality of red blood cells: Secondary | ICD-10-CM | POA: Diagnosis not present

## 2017-12-23 ENCOUNTER — Other Ambulatory Visit (HOSPITAL_COMMUNITY): Payer: Self-pay | Admitting: Obstetrics and Gynecology

## 2017-12-23 ENCOUNTER — Encounter (HOSPITAL_COMMUNITY): Payer: Self-pay

## 2017-12-23 ENCOUNTER — Ambulatory Visit (HOSPITAL_COMMUNITY)
Admission: RE | Admit: 2017-12-23 | Discharge: 2017-12-23 | Disposition: A | Discharge status: Home / Self Care | Payer: Medicaid Other | Source: Ambulatory Visit | Attending: Obstetrics and Gynecology | Admitting: Obstetrics and Gynecology

## 2017-12-23 DIAGNOSIS — O321XX0 Maternal care for breech presentation, not applicable or unspecified: Secondary | ICD-10-CM | POA: Insufficient documentation

## 2017-12-23 DIAGNOSIS — O361920 Maternal care for other isoimmunization, second trimester, not applicable or unspecified: Secondary | ICD-10-CM | POA: Diagnosis present

## 2017-12-23 DIAGNOSIS — Z3A24 24 weeks gestation of pregnancy: Secondary | ICD-10-CM | POA: Diagnosis not present

## 2017-12-30 ENCOUNTER — Ambulatory Visit (HOSPITAL_COMMUNITY)
Admission: RE | Admit: 2017-12-30 | Discharge: 2017-12-30 | Disposition: A | Discharge status: Home / Self Care | Payer: Medicaid Other | Source: Ambulatory Visit | Attending: Obstetrics and Gynecology | Admitting: Obstetrics and Gynecology

## 2017-12-30 ENCOUNTER — Other Ambulatory Visit (HOSPITAL_COMMUNITY): Payer: Self-pay | Admitting: *Deleted

## 2017-12-30 ENCOUNTER — Other Ambulatory Visit (HOSPITAL_COMMUNITY): Payer: Self-pay | Admitting: Obstetrics and Gynecology

## 2017-12-30 ENCOUNTER — Encounter (HOSPITAL_COMMUNITY): Payer: Self-pay

## 2017-12-30 DIAGNOSIS — Z3A25 25 weeks gestation of pregnancy: Secondary | ICD-10-CM | POA: Insufficient documentation

## 2017-12-30 DIAGNOSIS — O361920 Maternal care for other isoimmunization, second trimester, not applicable or unspecified: Secondary | ICD-10-CM

## 2017-12-30 DIAGNOSIS — O361990 Maternal care for other isoimmunization, unspecified trimester, not applicable or unspecified: Secondary | ICD-10-CM

## 2018-01-04 ENCOUNTER — Encounter: Payer: Self-pay | Admitting: Advanced Practice Midwife

## 2018-01-05 ENCOUNTER — Ambulatory Visit (HOSPITAL_COMMUNITY)
Admission: RE | Admit: 2018-01-05 | Discharge: 2018-01-05 | Disposition: A | Discharge status: Home / Self Care | Payer: Medicaid Other | Source: Ambulatory Visit | Attending: Advanced Practice Midwife | Admitting: Advanced Practice Midwife

## 2018-01-06 ENCOUNTER — Ambulatory Visit (HOSPITAL_COMMUNITY): Payer: Medicaid Other

## 2018-01-06 ENCOUNTER — Other Ambulatory Visit (HOSPITAL_COMMUNITY): Payer: Self-pay

## 2018-01-09 ENCOUNTER — Encounter (HOSPITAL_COMMUNITY): Payer: Self-pay

## 2018-01-09 ENCOUNTER — Other Ambulatory Visit (HOSPITAL_COMMUNITY): Payer: Self-pay | Admitting: Maternal and Fetal Medicine

## 2018-01-09 ENCOUNTER — Other Ambulatory Visit (HOSPITAL_COMMUNITY): Payer: Medicaid Other

## 2018-01-09 ENCOUNTER — Ambulatory Visit (HOSPITAL_COMMUNITY)
Admission: RE | Admit: 2018-01-09 | Discharge: 2018-01-09 | Disposition: A | Discharge status: Home / Self Care | Payer: Medicaid Other | Source: Ambulatory Visit | Attending: Maternal and Fetal Medicine | Admitting: Maternal and Fetal Medicine

## 2018-01-09 DIAGNOSIS — O361920 Maternal care for other isoimmunization, second trimester, not applicable or unspecified: Secondary | ICD-10-CM | POA: Diagnosis not present

## 2018-01-09 DIAGNOSIS — O361990 Maternal care for other isoimmunization, unspecified trimester, not applicable or unspecified: Secondary | ICD-10-CM

## 2018-01-09 DIAGNOSIS — Z3A26 26 weeks gestation of pregnancy: Secondary | ICD-10-CM | POA: Insufficient documentation

## 2018-01-09 NOTE — Addendum Note (Signed)
Encounter addended by: Emeline DarlingKiser, Jiali Linney E on: 01/09/2018 12:18 PM  Actions taken: Imaging Exam ended

## 2018-01-10 ENCOUNTER — Encounter: Payer: Self-pay | Admitting: Student

## 2018-01-10 DIAGNOSIS — I341 Nonrheumatic mitral (valve) prolapse: Secondary | ICD-10-CM | POA: Insufficient documentation

## 2018-01-11 ENCOUNTER — Ambulatory Visit (INDEPENDENT_AMBULATORY_CARE_PROVIDER_SITE_OTHER): Payer: Medicaid Other | Admitting: Student

## 2018-01-11 ENCOUNTER — Encounter: Payer: Self-pay | Admitting: Student

## 2018-01-11 VITALS — BP 120/62 | HR 62 | Wt 206.0 lb

## 2018-01-11 DIAGNOSIS — Z87441 Personal history of nephrotic syndrome: Secondary | ICD-10-CM

## 2018-01-11 DIAGNOSIS — O09292 Supervision of pregnancy with other poor reproductive or obstetric history, second trimester: Secondary | ICD-10-CM | POA: Diagnosis not present

## 2018-01-11 DIAGNOSIS — O361990 Maternal care for other isoimmunization, unspecified trimester, not applicable or unspecified: Secondary | ICD-10-CM | POA: Insufficient documentation

## 2018-01-11 DIAGNOSIS — I341 Nonrheumatic mitral (valve) prolapse: Secondary | ICD-10-CM | POA: Diagnosis not present

## 2018-01-11 DIAGNOSIS — O361920 Maternal care for other isoimmunization, second trimester, not applicable or unspecified: Secondary | ICD-10-CM

## 2018-01-11 DIAGNOSIS — Z23 Encounter for immunization: Secondary | ICD-10-CM | POA: Diagnosis not present

## 2018-01-11 DIAGNOSIS — Z634 Disappearance and death of family member: Secondary | ICD-10-CM | POA: Diagnosis not present

## 2018-01-11 DIAGNOSIS — Z3482 Encounter for supervision of other normal pregnancy, second trimester: Secondary | ICD-10-CM | POA: Diagnosis not present

## 2018-01-11 DIAGNOSIS — Z3480 Encounter for supervision of other normal pregnancy, unspecified trimester: Secondary | ICD-10-CM

## 2018-01-11 NOTE — Progress Notes (Signed)
   PRENATAL VISIT NOTE  Subjective:  Michele Maldonado is a 32 y.o. [phone] at 53w1dbeing seen today for ongoing prenatal care.  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  Patient reports no complaints.  Contractions: Regular. Vag. Bleeding: None.  Movement: Present. Denies leaking of fluid.   The following portions of the patient's history were reviewed and updated as appropriate: allergies, current medications, past family history, past medical history, past social history, past surgical history and problem list. Problem list updated.  Objective:   Vitals:   01/11/18 1049  BP: 120/62  Pulse: 62  Weight: 206 lb (93.4 kg)    Fetal Status: Fetal Heart Rate (bpm): 148   Movement: Present   Fundal height 28 cm  General:  Alert, oriented and cooperative. Patient is in no acute distress.  Skin: Skin is warm and dry. No rash noted.   Cardiovascular: Normal heart rate noted  Respiratory: Normal respiratory effort, no problems with respiration noted  Abdomen: Soft, gravid, appropriate for gestational age.  Pain/Pressure: Present     Pelvic: Cervical exam deferred        Extremities: Normal range of motion.  Edema: Trace  Mental Status:  Normal mood and affect. Normal behavior. Normal judgment and thought content.   Assessment and Plan:  Pregnancy: [id number] 271w1d1. Supervision of other normal pregnancy, antepartum - Doing well - CBC - Glucose Tolerance, 2 Hours w/1 Hour; Future - HIV antibody - RPR - Tdap vaccine greater than or equal to 7yo IM -  Antibody screen  2. History of nephrotic syndrome - vs atypical preeclampsia --- labs ordered per consultation with Dr. AnHarolyn Rutherford Comp Met (CMET) - Lactate Dehydrogenase (LDH) - Protein / creatinine ratio, urine - Uric acid - Antibody screen  3. 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 - Antibody screen  4. Mitral valve prolapse -Last echo in 2015 - ECHOCARDIOGRAM COMPLETE; Future  5. Hx of macrosomia in infant in prior pregnancy, currently pregnant, second trimester   Preterm labor symptoms and general obstetric precautions including but not limited to vaginal bleeding, contractions, leaking of fluid and fetal movement were reviewed in detail with the patient. Please refer to After Visit Summary for other counseling recommendations.  Return in about 2 weeks (around 01/25/2018) for Red chart patient -- with MD ONLY!!! (per Dr. AnHarolyn Rutherford   ErJorje GuildNP

## 2018-01-11 NOTE — Patient Instructions (Signed)

## 2018-01-12 LAB — CBC
HEMATOCRIT: 35.4 % (ref 34.0–46.6)
HEMOGLOBIN: 11.8 g/dL (ref 11.1–15.9)
MCH: 30.5 pg (ref 26.6–33.0)
MCHC: 33.3 g/dL (ref 31.5–35.7)
MCV: 92 fL (ref 79–97)
Platelets: 164 10*3/uL (ref 150–379)
RBC: 3.87 x10E6/uL (ref 3.77–5.28)
RDW: 13.8 % (ref 12.3–15.4)
WBC: 10.1 10*3/uL (ref 3.4–10.8)

## 2018-01-12 LAB — COMPREHENSIVE METABOLIC PANEL
A/G RATIO: 1.5 (ref 1.2–2.2)
ALT: 8 IU/L (ref 0–32)
AST: 14 IU/L (ref 0–40)
Albumin: 3.8 g/dL (ref 3.5–5.5)
Alkaline Phosphatase: 61 IU/L (ref 39–117)
BUN/Creatinine Ratio: 13 (ref 9–23)
BUN: 7 mg/dL (ref 6–20)
CALCIUM: 8.6 mg/dL — AB (ref 8.7–10.2)
CHLORIDE: 102 mmol/L (ref 96–106)
CO2: 21 mmol/L (ref 20–29)
Creatinine, Ser: 0.52 mg/dL — ABNORMAL LOW (ref 0.57–1.00)
GFR, EST AFRICAN AMERICAN: 148 mL/min/{1.73_m2} (ref >59)
GFR, EST NON AFRICAN AMERICAN: 129 mL/min/{1.73_m2} (ref >59)
Globulin, Total: 2.6 g/dL (ref 1.5–4.5)
Glucose: 78 mg/dL (ref 65–99)
POTASSIUM: 3.9 mmol/L (ref 3.5–5.2)
Sodium: 138 mmol/L (ref 134–144)
TOTAL PROTEIN: 6.4 g/dL (ref 6.0–8.5)

## 2018-01-12 LAB — PROTEIN / CREATININE RATIO, URINE
CREATININE, UR: 99.3 mg/dL
PROTEIN UR: 18.2 mg/dL
PROTEIN/CREAT RATIO: 183 mg/g{creat} (ref 0–200)

## 2018-01-12 LAB — URIC ACID: Uric Acid: 3.1 mg/dL (ref 2.5–7.1)

## 2018-01-12 LAB — RPR: RPR: NONREACTIVE

## 2018-01-12 LAB — LACTATE DEHYDROGENASE: LDH: 163 IU/L (ref 119–226)

## 2018-01-12 LAB — HIV ANTIBODY (ROUTINE TESTING W REFLEX): HIV SCREEN 4TH GENERATION: NONREACTIVE

## 2018-01-13 ENCOUNTER — Ambulatory Visit (HOSPITAL_COMMUNITY)
Admission: RE | Admit: 2018-01-13 | Discharge: 2018-01-13 | Disposition: A | Discharge status: Home / Self Care | Payer: Medicaid Other | Source: Ambulatory Visit | Attending: Advanced Practice Midwife | Admitting: Advanced Practice Midwife

## 2018-01-13 ENCOUNTER — Other Ambulatory Visit (HOSPITAL_COMMUNITY): Payer: Self-pay | Admitting: Maternal and Fetal Medicine

## 2018-01-13 ENCOUNTER — Encounter (HOSPITAL_COMMUNITY): Payer: Self-pay

## 2018-01-13 DIAGNOSIS — Z362 Encounter for other antenatal screening follow-up: Secondary | ICD-10-CM

## 2018-01-13 DIAGNOSIS — O361920 Maternal care for other isoimmunization, second trimester, not applicable or unspecified: Secondary | ICD-10-CM | POA: Diagnosis not present

## 2018-01-13 DIAGNOSIS — Z3480 Encounter for supervision of other normal pregnancy, unspecified trimester: Secondary | ICD-10-CM

## 2018-01-13 DIAGNOSIS — Z3A27 27 weeks gestation of pregnancy: Secondary | ICD-10-CM

## 2018-01-13 DIAGNOSIS — O361990 Maternal care for other isoimmunization, unspecified trimester, not applicable or unspecified: Secondary | ICD-10-CM

## 2018-01-13 DIAGNOSIS — O09292 Supervision of pregnancy with other poor reproductive or obstetric history, second trimester: Secondary | ICD-10-CM

## 2018-01-17 ENCOUNTER — Inpatient Hospital Stay (HOSPITAL_COMMUNITY): Admission: RE | Admit: 2018-01-17 | Payer: Self-pay | Source: Ambulatory Visit

## 2018-01-20 ENCOUNTER — Encounter (HOSPITAL_COMMUNITY): Payer: Self-pay

## 2018-01-20 ENCOUNTER — Ambulatory Visit (HOSPITAL_COMMUNITY)
Admission: RE | Admit: 2018-01-20 | Discharge: 2018-01-20 | Disposition: A | Discharge status: Home / Self Care | Payer: Medicaid Other | Source: Ambulatory Visit | Attending: Advanced Practice Midwife | Admitting: Advanced Practice Midwife

## 2018-01-20 ENCOUNTER — Other Ambulatory Visit (HOSPITAL_COMMUNITY): Payer: Self-pay | Admitting: Maternal and Fetal Medicine

## 2018-01-20 DIAGNOSIS — Z3A28 28 weeks gestation of pregnancy: Secondary | ICD-10-CM

## 2018-01-20 DIAGNOSIS — O34219 Maternal care for unspecified type scar from previous cesarean delivery: Secondary | ICD-10-CM | POA: Insufficient documentation

## 2018-01-20 DIAGNOSIS — O361920 Maternal care for other isoimmunization, second trimester, not applicable or unspecified: Secondary | ICD-10-CM | POA: Diagnosis not present

## 2018-01-20 DIAGNOSIS — O361990 Maternal care for other isoimmunization, unspecified trimester, not applicable or unspecified: Secondary | ICD-10-CM | POA: Diagnosis present

## 2018-01-20 DIAGNOSIS — O09299 Supervision of pregnancy with other poor reproductive or obstetric history, unspecified trimester: Secondary | ICD-10-CM | POA: Insufficient documentation

## 2018-01-20 DIAGNOSIS — O09213 Supervision of pregnancy with history of pre-term labor, third trimester: Secondary | ICD-10-CM | POA: Insufficient documentation

## 2018-01-20 DIAGNOSIS — Z3480 Encounter for supervision of other normal pregnancy, unspecified trimester: Secondary | ICD-10-CM

## 2018-01-20 DIAGNOSIS — O09292 Supervision of pregnancy with other poor reproductive or obstetric history, second trimester: Secondary | ICD-10-CM

## 2018-01-27 ENCOUNTER — Other Ambulatory Visit (HOSPITAL_COMMUNITY): Payer: Self-pay | Admitting: Maternal and Fetal Medicine

## 2018-01-27 ENCOUNTER — Encounter (HOSPITAL_COMMUNITY): Payer: Self-pay

## 2018-01-27 ENCOUNTER — Ambulatory Visit (HOSPITAL_COMMUNITY)
Admission: RE | Admit: 2018-01-27 | Discharge: 2018-01-27 | Disposition: A | Discharge status: Home / Self Care | Payer: Medicaid Other | Source: Ambulatory Visit | Attending: Advanced Practice Midwife | Admitting: Advanced Practice Midwife

## 2018-01-27 DIAGNOSIS — Z3480 Encounter for supervision of other normal pregnancy, unspecified trimester: Secondary | ICD-10-CM

## 2018-01-27 DIAGNOSIS — O09293 Supervision of pregnancy with other poor reproductive or obstetric history, third trimester: Secondary | ICD-10-CM | POA: Diagnosis not present

## 2018-01-27 DIAGNOSIS — Z3A29 29 weeks gestation of pregnancy: Secondary | ICD-10-CM

## 2018-01-27 DIAGNOSIS — O361990 Maternal care for other isoimmunization, unspecified trimester, not applicable or unspecified: Secondary | ICD-10-CM

## 2018-01-27 DIAGNOSIS — O09213 Supervision of pregnancy with history of pre-term labor, third trimester: Secondary | ICD-10-CM | POA: Diagnosis not present

## 2018-01-27 DIAGNOSIS — O34219 Maternal care for unspecified type scar from previous cesarean delivery: Secondary | ICD-10-CM | POA: Insufficient documentation

## 2018-01-27 DIAGNOSIS — O361920 Maternal care for other isoimmunization, second trimester, not applicable or unspecified: Secondary | ICD-10-CM

## 2018-01-27 DIAGNOSIS — O09292 Supervision of pregnancy with other poor reproductive or obstetric history, second trimester: Secondary | ICD-10-CM

## 2018-01-29 ENCOUNTER — Encounter (HOSPITAL_COMMUNITY): Payer: Self-pay | Admitting: *Deleted

## 2018-01-29 ENCOUNTER — Inpatient Hospital Stay (HOSPITAL_COMMUNITY)
Admission: AD | Admit: 2018-01-29 | Discharge: 2018-01-29 | Disposition: A | Discharge status: Home / Self Care | Payer: Medicaid Other | Source: Ambulatory Visit | Attending: Obstetrics & Gynecology | Admitting: Obstetrics & Gynecology

## 2018-01-29 ENCOUNTER — Other Ambulatory Visit: Payer: Self-pay

## 2018-01-29 DIAGNOSIS — O9989 Other specified diseases and conditions complicating pregnancy, childbirth and the puerperium: Secondary | ICD-10-CM

## 2018-01-29 DIAGNOSIS — M5441 Lumbago with sciatica, right side: Secondary | ICD-10-CM | POA: Insufficient documentation

## 2018-01-29 DIAGNOSIS — Z3A29 29 weeks gestation of pregnancy: Secondary | ICD-10-CM | POA: Diagnosis not present

## 2018-01-29 DIAGNOSIS — Z7982 Long term (current) use of aspirin: Secondary | ICD-10-CM | POA: Diagnosis not present

## 2018-01-29 DIAGNOSIS — M5442 Lumbago with sciatica, left side: Secondary | ICD-10-CM | POA: Insufficient documentation

## 2018-01-29 DIAGNOSIS — O99413 Diseases of the circulatory system complicating pregnancy, third trimester: Secondary | ICD-10-CM | POA: Insufficient documentation

## 2018-01-29 DIAGNOSIS — O99891 Other specified diseases and conditions complicating pregnancy: Secondary | ICD-10-CM

## 2018-01-29 DIAGNOSIS — M549 Dorsalgia, unspecified: Secondary | ICD-10-CM

## 2018-01-29 DIAGNOSIS — M5432 Sciatica, left side: Secondary | ICD-10-CM

## 2018-01-29 DIAGNOSIS — M5431 Sciatica, right side: Secondary | ICD-10-CM

## 2018-01-29 DIAGNOSIS — I341 Nonrheumatic mitral (valve) prolapse: Secondary | ICD-10-CM | POA: Diagnosis not present

## 2018-01-29 DIAGNOSIS — O26893 Other specified pregnancy related conditions, third trimester: Secondary | ICD-10-CM | POA: Insufficient documentation

## 2018-01-29 MED ORDER — CYCLOBENZAPRINE HCL 10 MG PO TABS: 10.0000 mg | ORAL_TABLET | Freq: Two times a day (BID) | ORAL | 0 refills | Status: DC | PRN

## 2018-01-29 NOTE — Discharge Instructions (Signed)
Back Exercises The following exercises strengthen the muscles that help to support the back. They also help to keep the lower back flexible. Doing these exercises can help to prevent back pain or lessen existing pain. If you have back pain or discomfort, try doing these exercises 2-3 times each day or as told by your health care provider. When the pain goes away, do them once each day, but increase the number of times that you repeat the steps for each exercise (do more repetitions). If you do not have back pain or discomfort, do these exercises once each day or as told by your health care provider. Exercises Single Knee to Chest  Repeat these steps 3-5 times for each leg: 1. Lie on your back on a firm bed or the floor with your legs extended. 2. Bring one knee to your chest. Your other leg should stay extended and in contact with the floor. 3. Hold your knee in place by grabbing your knee or thigh. 4. Pull on your knee until you feel a gentle stretch in your lower back. 5. Hold the stretch for 10-30 seconds. 6. Slowly release and straighten your leg.  Pelvic Tilt  Repeat these steps 5-10 times: 1. Lie on your back on a firm bed or the floor with your legs extended. 2. Bend your knees so they are pointing toward the ceiling and your feet are flat on the floor. 3. Tighten your lower abdominal muscles to press your lower back against the floor. This motion will tilt your pelvis so your tailbone points up toward the ceiling instead of pointing to your feet or the floor. 4. With gentle tension and even breathing, hold this position for 5-10 seconds.  Cat-Cow  Repeat these steps until your lower back becomes more flexible: 1. Get into a hands-and-knees position on a firm surface. Keep your hands under your shoulders, and keep your knees under your hips. You may place padding under your knees for comfort. 2. Let your head hang down, and point your tailbone toward the floor so your lower back  becomes rounded like the back of a cat. 3. Hold this position for 5 seconds. 4. Slowly lift your head and point your tailbone up toward the ceiling so your back forms a sagging arch like the back of a cow. 5. Hold this position for 5 seconds.  Press-Ups  Repeat these steps 5-10 times: 1. Lie on your abdomen (face-down) on the floor. 2. Place your palms near your head, about shoulder-width apart. 3. While you keep your back as relaxed as possible and keep your hips on the floor, slowly straighten your arms to raise the top half of your body and lift your shoulders. Do not use your back muscles to raise your upper torso. You may adjust the placement of your hands to make yourself more comfortable. 4. Hold this position for 5 seconds while you keep your back relaxed. 5. Slowly return to lying flat on the floor.  Bridges  Repeat these steps 10 times: 1. Lie on your back on a firm surface. 2. Bend your knees so they are pointing toward the ceiling and your feet are flat on the floor. 3. Tighten your buttocks muscles and lift your buttocks off of the floor until your waist is at almost the same height as your knees. You should feel the muscles working in your buttocks and the back of your thighs. If you do not feel these muscles, slide your feet 1-2 inches farther away  from your buttocks. 4. Hold this position for 3-5 seconds. 5. Slowly lower your hips to the starting position, and allow your buttocks muscles to relax completely.  If this exercise is too easy, try doing it with your arms crossed over your chest. Abdominal Crunches  Repeat these steps 5-10 times: 1. Lie on your back on a firm bed or the floor with your legs extended. 2. Bend your knees so they are pointing toward the ceiling and your feet are flat on the floor. 3. Cross your arms over your chest. 4. Tip your chin slightly toward your chest without bending your neck. 5. Tighten your abdominal muscles and slowly raise your  trunk (torso) high enough to lift your shoulder blades a tiny bit off of the floor. Avoid raising your torso higher than that, because it can put too much stress on your low back and it does not help to strengthen your abdominal muscles. 6. Slowly return to your starting position.  Back Lifts Repeat these steps 5-10 times: 1. Lie on your abdomen (face-down) with your arms at your sides, and rest your forehead on the floor. 2. Tighten the muscles in your legs and your buttocks. 3. Slowly lift your chest off of the floor while you keep your hips pressed to the floor. Keep the back of your head in line with the curve in your back. Your eyes should be looking at the floor. 4. Hold this position for 3-5 seconds. 5. Slowly return to your starting position.  Contact a health care provider if:  Your back pain or discomfort gets much worse when you do an exercise.  Your back pain or discomfort does not lessen within 2 hours after you exercise. If you have any of these problems, stop doing these exercises right away. Do not do them again unless your health care provider says that you can. Get help right away if:  You develop sudden, severe back pain. If this happens, stop doing the exercises right away. Do not do them again unless your health care provider says that you can. This information is not intended to replace advice given to you by your health care provider. Make sure you discuss any questions you have with your health care provider. Document Released: 12/09/2004 Document Revised: 03/10/2016 Document Reviewed: 12/26/2014 Elsevier Interactive Patient Education  2017 [employer]  Sciatica Sciatica is pain, numbness, weakness, or tingling along the path of the sciatic nerve. The sciatic nerve starts in the lower back and runs down the back of each leg. The nerve controls the muscles in the lower leg and in the back of the knee. It also provides feeling (sensation) to the back of the thigh,  the lower leg, and the sole of the foot. Sciatica is a symptom of another medical condition that pinches or puts pressure on the sciatic nerve. Generally, sciatica only affects one side of the body. Sciatica usually goes away on its own or with treatment. In some cases, sciatica may keep coming back (recur). What are the causes? This condition is caused by pressure on the sciatic nerve, or pinching of the sciatic nerve. This may be the result of:  A disk in between the bones of the spine (vertebrae) bulging out too far (herniated disk).  Age-related changes in the spinal disks (degenerative disk disease).  A pain disorder that affects a muscle in the buttock (piriformis syndrome).  Extra bone growth (bone spur) near the sciatic nerve.  An injury or break (fracture) of the  pelvis.  Pregnancy.  Tumor (rare).  What increases the risk? The following factors may make you more likely to develop this condition:  Playing sports that place pressure or stress on the spine, such as football or weight lifting.  Having poor strength and flexibility.  A history of back injury.  A history of back surgery.  Sitting for long periods of time.  Doing activities that involve repetitive bending or lifting.  Obesity.  What are the signs or symptoms? Symptoms can vary from mild to very severe, and they may include:  Any of these problems in the lower back, leg, hip, or buttock: ? Mild tingling or dull aches. ? Burning sensations. ? Sharp pains.  Numbness in the back of the calf or the sole of the foot.  Leg weakness.  Severe back pain that makes movement difficult.  These symptoms may get worse when you cough, sneeze, or laugh, or when you sit or stand for long periods of time. Being overweight may also make symptoms worse. In some cases, symptoms may recur over time. How is this diagnosed? This condition may be diagnosed based on:  Your symptoms.  A physical exam. Your health care  provider may ask you to do certain movements to check whether those movements trigger your symptoms.  You may have tests, including: ? Blood tests. ? X-rays. ? MRI. ? CT scan.  How is this treated? In many cases, this condition improves on its own, without any treatment. However, treatment may include:  Reducing or modifying physical activity during periods of pain.  Exercising and stretching to strengthen your abdomen and improve the flexibility of your spine.  Icing and applying heat to the affected area.  Medicines that help: ? To relieve pain and swelling. ? To relax your muscles.  Injections of medicines that help to relieve pain, irritation, and inflammation around the sciatic nerve (steroids).  Surgery.  Follow these instructions at home: Medicines  Take over-the-counter and prescription medicines only as told by your health care provider.  Do not drive or operate heavy machinery while taking prescription pain medicine. Managing pain  If directed, apply ice to the affected area. ? Put ice in a plastic bag. ? Place a towel between your skin and the bag. ? Leave the ice on for 20 minutes, 2-3 times a day.  After icing, apply heat to the affected area before you exercise or as often as told by your health care provider. Use the heat source that your health care provider recommends, such as a moist heat pack or a heating pad. ? Place a towel between your skin and the heat source. ? Leave the heat on for 20-30 minutes. ? Remove the heat if your skin turns bright red. This is especially important if you are unable to feel pain, heat, or cold. You may have a greater risk of getting burned. Activity  Return to your normal activities as told by your health care provider. Ask your health care provider what activities are safe for you. ? Avoid activities that make your symptoms worse.  Take brief periods of rest throughout the day. Resting in a lying or standing position is  usually better than sitting to rest. ? When you rest for longer periods, mix in some mild activity or stretching between periods of rest. This will help to prevent stiffness and pain. ? Avoid sitting for long periods of time without moving. Get up and move around at least one time each hour.  Exercise  and stretch regularly, as told by your health care provider.  Do not lift anything that is heavier than 10 lb (4.5 kg) while you have symptoms of sciatica. When you do not have symptoms, you should still avoid heavy lifting, especially repetitive heavy lifting.  When you lift objects, always use proper lifting technique, which includes: ? Bending your knees. ? Keeping the load close to your body. ? Avoiding twisting. General instructions  Use good posture. ? Avoid leaning forward while sitting. ? Avoid hunching over while standing.  Maintain a healthy weight. Excess weight puts extra stress on your back and makes it difficult to maintain good posture.  Wear supportive, comfortable shoes. Avoid wearing high heels.  Avoid sleeping on a mattress that is too soft or too hard. A mattress that is firm enough to support your back when you sleep may help to reduce your pain.  Keep all follow-up visits as told by your health care provider. This is important. Contact a health care provider if:  You have pain that wakes you up when you are sleeping.  You have pain that gets worse when you lie down.  Your pain is worse than you have experienced in the past.  Your pain lasts longer than 4 weeks.  You experience unexplained weight loss. Get help right away if:  You lose control of your bowel or bladder (incontinence).  You have: ? Weakness in your lower back, pelvis, buttocks, or legs that gets worse. ? Redness or swelling of your back. ? A burning sensation when you urinate. This information is not intended to replace advice given to you by your health care provider. Make sure you discuss  any questions you have with your health care provider. Document Released: 10/26/2001 Document Revised: 04/06/2016 Document Reviewed: 07/11/2015 Elsevier Interactive Patient Education  [employer].

## 2018-01-29 NOTE — MAU Provider Note (Signed)
History     CSN: [id number]  Arrival date and time: 01/29/18 1842   First Provider Initiated Contact with Patient 01/29/18 09-Apr-2037     Chief Complaint  Patient presents with  . Back Pain  . "Tail Pain"   HPI Michele Maldonado is a 32 y.o. [id number] at [redacted]w[redacted]d who presents with lower back and tail bone pain. She states it is a constant ache and she was worried because the baby is having so many complications. She denies any leaking or bleeding. Reports good fetal movement. Tried tylenol for the pain with no relief.   OB History    Gravida Para Term Preterm AB Living   6 5 4 1  0 4   SAB TAB Ectopic Multiple Live Births   0 0 0 0 5      Obstetric Comments   Child born in [birth year] deceased --- died at 69 months old, taken to ED unresponsive -- pt not ready to talk       Past Medical History:  Diagnosis Date  . MVP (mitral valve prolapse)   . Pregnant   . Preterm labor   . Thrombocytopenia (HCC)     Past Surgical History:  Procedure Laterality Date  . CESAREAN SECTION N/A 12/29/2013   Procedure: CESAREAN SECTION;  Surgeon: Allie Bossier, MD;  Location: [facility];  Service: Obstetrics;  Laterality: N/A;  . REPAIR VAGINAL CUFF N/A 12/30/2013   Procedure: REPAIR VAGINAL CUFF;  Surgeon: Allie Bossier, MD;  Location: [facility];  Service: Gynecology;  Laterality: N/A;  Insertion of Bakri Balloon    Family History  Problem Relation Age of Onset  . Hypertension Maternal Grandmother   . Diabetes Maternal Grandmother   . Thyroid disease Mother     Social History   Tobacco Use  . Smoking status: Never Smoker  . Smokeless tobacco: Never Used  Substance Use Topics  . Alcohol use: No  . Drug use: No    Comment: former    Allergies: No Known Allergies  Medications Prior to Admission  Medication Sig Dispense Refill Last Dose  . aspirin EC 81 MG tablet Take 1 tablet (81 mg total) daily by mouth. 30 tablet 11 Taking  . Prenatal Vit-Fe Fumarate-FA (PRENATAL COMPLETE) 14-0.4 MG TABS Take 1 tablet by  mouth daily after breakfast. 30 each 2 Taking    Review of Systems  Constitutional: Negative.  Negative for fatigue and fever.  HENT: Negative.   Respiratory: Negative.  Negative for shortness of breath.   Cardiovascular: Negative.  Negative for chest pain.  Gastrointestinal: Negative.  Negative for abdominal pain, constipation, diarrhea, nausea and vomiting.  Genitourinary: Negative.  Negative for dysuria.  Musculoskeletal: Positive for back pain.  Neurological: Negative.  Negative for dizziness and headaches.   Physical Exam   Blood pressure 118/61, pulse 89, temperature 98.5 F (36.9 C), temperature source Oral, resp. rate 18, height 5\' 8"  (1.727 m), weight 214 lb (97.1 kg), last menstrual period 07/05/2017, SpO2 99 %.  Physical Exam  Nursing note and vitals reviewed. Constitutional: She is oriented to person, place, and time. She appears well-developed and well-nourished. No distress.  HENT:  Head: Normocephalic.  Eyes: Pupils are equal, round, and reactive to light.  Cardiovascular: Normal rate, regular rhythm and normal heart sounds.  Respiratory: Effort normal and breath sounds normal. No respiratory distress.  GI: Soft. Bowel sounds are normal. She exhibits no distension. There is no tenderness.  Neurological: She is alert and oriented to person, place, and time.  Skin: Skin is warm and dry.  Psychiatric: She has a normal mood and affect. Her behavior is normal. Judgment and thought content normal.   Fetal Tracing:  Baseline: 120  Variability: moderate Accels: 10x10 Decels: variable  Toco: none   MAU Course  Procedures  MDM Patient unable to leave urine sample. Discussed with patient possibility of pain being related to UTI. Patient does not want to attempt to give a sample  Cervix closed and thick.  FHT reviewed with Dr. Despina HiddenEure  Patient driving, unable to give muscle relaxer here. Will prescribe for patient to try at home.  Patient requesting to leave after  cervical exam. Does not want to be monitored anymore.   Assessment and Plan   1. Back pain affecting pregnancy in third trimester   2. Bilateral sciatica   3. [redacted] weeks gestation of pregnancy    -Discharge home in stable condition -Rx for flexeril given to patient -Preterm labor precautions discussed -[facility] -Patient may return to MAU as needed or if her condition were to change or worsen  Rolm BookbinderCaroline M Taniela Feltus CNM 01/29/2018, 8:49 PM

## 2018-01-29 NOTE — MAU Note (Signed)
Pt presents with c/o lower back pain & tail pain that began yesterday.  Denies ctxs.   Reports has taken Tylenol, no relief noted. Denies VB or LOF.  Reports +FM.

## 2018-01-30 ENCOUNTER — Other Ambulatory Visit (HOSPITAL_COMMUNITY): Payer: Self-pay | Admitting: *Deleted

## 2018-01-30 DIAGNOSIS — O361130 Maternal care for Anti-A sensitization, third trimester, not applicable or unspecified: Secondary | ICD-10-CM

## 2018-01-31 ENCOUNTER — Ambulatory Visit (HOSPITAL_COMMUNITY)
Admission: RE | Admit: 2018-01-31 | Discharge: 2018-01-31 | Disposition: A | Discharge status: Home / Self Care | Payer: Medicaid Other | Source: Ambulatory Visit | Attending: Advanced Practice Midwife | Admitting: Advanced Practice Midwife

## 2018-01-31 ENCOUNTER — Encounter (HOSPITAL_COMMUNITY): Payer: Self-pay

## 2018-01-31 DIAGNOSIS — O361130 Maternal care for Anti-A sensitization, third trimester, not applicable or unspecified: Secondary | ICD-10-CM | POA: Insufficient documentation

## 2018-01-31 DIAGNOSIS — O361920 Maternal care for other isoimmunization, second trimester, not applicable or unspecified: Secondary | ICD-10-CM

## 2018-01-31 DIAGNOSIS — O09213 Supervision of pregnancy with history of pre-term labor, third trimester: Secondary | ICD-10-CM | POA: Diagnosis not present

## 2018-01-31 DIAGNOSIS — O09293 Supervision of pregnancy with other poor reproductive or obstetric history, third trimester: Secondary | ICD-10-CM | POA: Diagnosis not present

## 2018-01-31 DIAGNOSIS — Z3A30 30 weeks gestation of pregnancy: Secondary | ICD-10-CM | POA: Diagnosis not present

## 2018-01-31 DIAGNOSIS — O34219 Maternal care for unspecified type scar from previous cesarean delivery: Secondary | ICD-10-CM | POA: Diagnosis not present

## 2018-01-31 DIAGNOSIS — Z3480 Encounter for supervision of other normal pregnancy, unspecified trimester: Secondary | ICD-10-CM

## 2018-01-31 DIAGNOSIS — O09292 Supervision of pregnancy with other poor reproductive or obstetric history, second trimester: Secondary | ICD-10-CM

## 2018-02-01 ENCOUNTER — Ambulatory Visit (INDEPENDENT_AMBULATORY_CARE_PROVIDER_SITE_OTHER): Payer: Medicaid Other | Admitting: Obstetrics and Gynecology

## 2018-02-01 ENCOUNTER — Encounter: Payer: Self-pay | Admitting: Obstetrics and Gynecology

## 2018-02-01 ENCOUNTER — Encounter: Payer: Self-pay | Admitting: General Practice

## 2018-02-01 ENCOUNTER — Other Ambulatory Visit: Payer: Medicaid Other

## 2018-02-01 VITALS — BP 118/62 | HR 82 | Wt 211.6 lb

## 2018-02-01 DIAGNOSIS — Z3480 Encounter for supervision of other normal pregnancy, unspecified trimester: Secondary | ICD-10-CM

## 2018-02-01 DIAGNOSIS — I341 Nonrheumatic mitral (valve) prolapse: Secondary | ICD-10-CM

## 2018-02-01 DIAGNOSIS — Z862 Personal history of diseases of the blood and blood-forming organs and certain disorders involving the immune mechanism: Secondary | ICD-10-CM

## 2018-02-01 DIAGNOSIS — Z87441 Personal history of nephrotic syndrome: Secondary | ICD-10-CM | POA: Diagnosis not present

## 2018-02-01 DIAGNOSIS — O361920 Maternal care for other isoimmunization, second trimester, not applicable or unspecified: Secondary | ICD-10-CM | POA: Diagnosis not present

## 2018-02-01 DIAGNOSIS — O34219 Maternal care for unspecified type scar from previous cesarean delivery: Secondary | ICD-10-CM | POA: Diagnosis not present

## 2018-02-01 DIAGNOSIS — Z634 Disappearance and death of family member: Secondary | ICD-10-CM

## 2018-02-01 NOTE — Progress Notes (Signed)
   PRENATAL VISIT NOTE  Subjective:  Michele Maldonado is a 32 y.o. [id number] at 10218w1d being seen today for ongoing prenatal care.  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  Patient reports occasional contractions and some swelling of feet but feels it is normal.  Contractions: Irregular. Vag. Bleeding: None.  Movement: Present. Denies leaking of fluid.   The following portions of the patient's history were reviewed and updated as appropriate: allergies, current medications, past family history, past medical history, past social history, past surgical history and problem list. Problem list updated.  Objective:   Vitals:   02/01/18 0851  BP: 118/62  Pulse: 82  Weight: 211 lb 9.6 oz (96 kg)    Fetal Status: Fetal Heart Rate (bpm): 127   Movement: Present     General:  Alert, oriented and cooperative. Patient is in no acute distress.  Skin: Skin is warm and dry. No rash noted.   Cardiovascular: Normal heart rate noted  Respiratory: Normal respiratory effort, no problems with respiration noted  Abdomen: Soft, gravid, appropriate for gestational age.  Pain/Pressure: Absent     Pelvic: Cervical exam deferred        Extremities: Normal range of motion.  Edema: None  Mental Status:  Normal mood and affect. Normal behavior. Normal judgment and thought content.   Assessment and Plan:  Pregnancy: [id number] at 8018w1d  1. Previous cesarean delivery, antepartum Reviewed risks/benefits of TOLAC versus RCS in detail. Patient counseled regarding potential  vaginal delivery, chance of success, future implications, possible uterine rupture and need for urgent/emergent repeat cesarean. Counseled regarding potential need for repeat c-section for reasons unrelated to first c-section. Counseled regarding scheduled repeat cesarean including risks of bleeding, infection, damage to surrounding tissue, abnormal placentation, implications for future pregnancies. All questions answered.  Patient desires TOLAC, consent signed 02/01/2018.  2. History of nephrotic syndrome No swelling today  3. Supervision of other normal pregnancy, antepartum Counseled regarding risks/benefits of flu vaccine, patient declines vaccine.   4. 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 Death of child @ 5 months, does not want to talk about it  6. Hx of thrombocytopenia Repeat labs 34-37 weeks  7. Mitral valve prolapse Reports h/o heart murmur as a child but has never had issues with heart Echo scheduled for 02/08/18  Preterm labor symptoms and general obstetric precautions including but not limited to vaginal bleeding, contractions, leaking of fluid and fetal movement were reviewed in detail with the patient. Please refer to After Visit Summary for other counseling recommendations.  Return in about 2 weeks (around 02/15/2018) for OB visit (MD).   Conan BowensKelly M Osualdo Hansell, MD

## 2018-02-02 LAB — GLUCOSE TOLERANCE, 2 HOURS W/ 1HR
GLUCOSE, 1 HOUR: 91 mg/dL (ref 65–179)
GLUCOSE, 2 HOUR: 80 mg/dL (ref 65–152)
Glucose, Fasting: 109 mg/dL — ABNORMAL HIGH (ref 65–91)

## 2018-02-03 ENCOUNTER — Ambulatory Visit (HOSPITAL_COMMUNITY): Admission: RE | Admit: 2018-02-03 | Payer: Medicaid Other | Source: Ambulatory Visit

## 2018-02-03 ENCOUNTER — Other Ambulatory Visit (HOSPITAL_COMMUNITY): Payer: Self-pay | Admitting: Obstetrics and Gynecology

## 2018-02-03 ENCOUNTER — Encounter (HOSPITAL_COMMUNITY): Payer: Self-pay

## 2018-02-03 ENCOUNTER — Ambulatory Visit (HOSPITAL_COMMUNITY)
Admission: RE | Admit: 2018-02-03 | Discharge: 2018-02-03 | Disposition: A | Discharge status: Home / Self Care | Payer: Medicaid Other | Source: Ambulatory Visit | Attending: Advanced Practice Midwife | Admitting: Advanced Practice Midwife

## 2018-02-03 DIAGNOSIS — O09293 Supervision of pregnancy with other poor reproductive or obstetric history, third trimester: Secondary | ICD-10-CM | POA: Insufficient documentation

## 2018-02-03 DIAGNOSIS — O09213 Supervision of pregnancy with history of pre-term labor, third trimester: Secondary | ICD-10-CM | POA: Diagnosis not present

## 2018-02-03 DIAGNOSIS — O09299 Supervision of pregnancy with other poor reproductive or obstetric history, unspecified trimester: Secondary | ICD-10-CM

## 2018-02-03 DIAGNOSIS — O09899 Supervision of other high risk pregnancies, unspecified trimester: Secondary | ICD-10-CM

## 2018-02-03 DIAGNOSIS — Z3A30 30 weeks gestation of pregnancy: Secondary | ICD-10-CM | POA: Diagnosis not present

## 2018-02-03 DIAGNOSIS — O34219 Maternal care for unspecified type scar from previous cesarean delivery: Secondary | ICD-10-CM

## 2018-02-03 DIAGNOSIS — O09219 Supervision of pregnancy with history of pre-term labor, unspecified trimester: Secondary | ICD-10-CM

## 2018-02-03 DIAGNOSIS — O361130 Maternal care for Anti-A sensitization, third trimester, not applicable or unspecified: Secondary | ICD-10-CM

## 2018-02-03 DIAGNOSIS — O361920 Maternal care for other isoimmunization, second trimester, not applicable or unspecified: Secondary | ICD-10-CM | POA: Diagnosis not present

## 2018-02-03 DIAGNOSIS — Q270 Congenital absence and hypoplasia of umbilical artery: Secondary | ICD-10-CM

## 2018-02-03 LAB — AB SCR+ANTIBODY ID: Antibody Screen: POSITIVE — AB

## 2018-02-03 LAB — ANTIBODY SCREEN

## 2018-02-07 ENCOUNTER — Encounter (HOSPITAL_COMMUNITY): Payer: Self-pay

## 2018-02-07 ENCOUNTER — Other Ambulatory Visit (HOSPITAL_COMMUNITY): Payer: Self-pay | Admitting: Obstetrics and Gynecology

## 2018-02-07 ENCOUNTER — Other Ambulatory Visit (HOSPITAL_COMMUNITY): Payer: Self-pay

## 2018-02-07 ENCOUNTER — Other Ambulatory Visit (HOSPITAL_COMMUNITY): Payer: Self-pay | Admitting: *Deleted

## 2018-02-07 ENCOUNTER — Ambulatory Visit (HOSPITAL_COMMUNITY)
Admission: RE | Admit: 2018-02-07 | Discharge: 2018-02-07 | Disposition: A | Discharge status: Home / Self Care | Payer: Medicaid Other | Source: Ambulatory Visit | Attending: Advanced Practice Midwife | Admitting: Advanced Practice Midwife

## 2018-02-07 DIAGNOSIS — O09213 Supervision of pregnancy with history of pre-term labor, third trimester: Secondary | ICD-10-CM | POA: Insufficient documentation

## 2018-02-07 DIAGNOSIS — O09293 Supervision of pregnancy with other poor reproductive or obstetric history, third trimester: Secondary | ICD-10-CM | POA: Insufficient documentation

## 2018-02-07 DIAGNOSIS — O361920 Maternal care for other isoimmunization, second trimester, not applicable or unspecified: Secondary | ICD-10-CM

## 2018-02-07 DIAGNOSIS — Z8759 Personal history of other complications of pregnancy, childbirth and the puerperium: Secondary | ICD-10-CM

## 2018-02-07 DIAGNOSIS — O34219 Maternal care for unspecified type scar from previous cesarean delivery: Secondary | ICD-10-CM | POA: Diagnosis not present

## 2018-02-07 DIAGNOSIS — Z98891 History of uterine scar from previous surgery: Secondary | ICD-10-CM

## 2018-02-07 DIAGNOSIS — O361130 Maternal care for Anti-A sensitization, third trimester, not applicable or unspecified: Secondary | ICD-10-CM | POA: Diagnosis present

## 2018-02-07 DIAGNOSIS — Z3A31 31 weeks gestation of pregnancy: Secondary | ICD-10-CM | POA: Diagnosis not present

## 2018-02-07 DIAGNOSIS — O361190 Maternal care for Anti-A sensitization, unspecified trimester, not applicable or unspecified: Secondary | ICD-10-CM

## 2018-02-07 DIAGNOSIS — O09292 Supervision of pregnancy with other poor reproductive or obstetric history, second trimester: Secondary | ICD-10-CM

## 2018-02-07 DIAGNOSIS — Z3480 Encounter for supervision of other normal pregnancy, unspecified trimester: Secondary | ICD-10-CM

## 2018-02-08 ENCOUNTER — Other Ambulatory Visit (HOSPITAL_COMMUNITY): Payer: Self-pay | Admitting: *Deleted

## 2018-02-08 ENCOUNTER — Ambulatory Visit (HOSPITAL_COMMUNITY)
Admission: RE | Admit: 2018-02-08 | Discharge: 2018-02-08 | Disposition: A | Discharge status: Home / Self Care | Payer: Medicaid Other | Source: Ambulatory Visit | Attending: Advanced Practice Midwife | Admitting: Advanced Practice Midwife

## 2018-02-08 ENCOUNTER — Ambulatory Visit (HOSPITAL_COMMUNITY): Payer: Self-pay

## 2018-02-08 ENCOUNTER — Encounter (HOSPITAL_COMMUNITY): Payer: Self-pay

## 2018-02-08 ENCOUNTER — Telehealth (HOSPITAL_COMMUNITY): Payer: Self-pay | Admitting: *Deleted

## 2018-02-08 DIAGNOSIS — Z3A31 31 weeks gestation of pregnancy: Secondary | ICD-10-CM | POA: Insufficient documentation

## 2018-02-08 DIAGNOSIS — O361190 Maternal care for Anti-A sensitization, unspecified trimester, not applicable or unspecified: Secondary | ICD-10-CM | POA: Diagnosis present

## 2018-02-08 DIAGNOSIS — O09219 Supervision of pregnancy with history of pre-term labor, unspecified trimester: Secondary | ICD-10-CM | POA: Insufficient documentation

## 2018-02-08 DIAGNOSIS — O361130 Maternal care for Anti-A sensitization, third trimester, not applicable or unspecified: Secondary | ICD-10-CM | POA: Insufficient documentation

## 2018-02-08 DIAGNOSIS — O361930 Maternal care for other isoimmunization, third trimester, not applicable or unspecified: Secondary | ICD-10-CM

## 2018-02-08 DIAGNOSIS — O34219 Maternal care for unspecified type scar from previous cesarean delivery: Secondary | ICD-10-CM | POA: Insufficient documentation

## 2018-02-08 MED ORDER — BETAMETHASONE SOD PHOS & ACET 6 (3-3) MG/ML IJ SUSP
12.0000 mg | Freq: Once | INTRAMUSCULAR | Status: AC
  Administered 2018-02-08: 12 mg via INTRAMUSCULAR
  Filled 2018-02-08: qty 2

## 2018-02-08 NOTE — Telephone Encounter (Signed)
Pt left MFM without receiving BMZ inj #1.  Called pt, requested she return for injection. Pt agreeable and voiced understanding.

## 2018-02-09 ENCOUNTER — Encounter (HOSPITAL_COMMUNITY): Payer: Self-pay

## 2018-02-09 ENCOUNTER — Other Ambulatory Visit (HOSPITAL_COMMUNITY): Payer: Self-pay | Admitting: Obstetrics and Gynecology

## 2018-02-09 ENCOUNTER — Ambulatory Visit (HOSPITAL_COMMUNITY): Admission: RE | Admit: 2018-02-09 | Payer: Medicaid Other | Source: Ambulatory Visit

## 2018-02-09 ENCOUNTER — Ambulatory Visit (HOSPITAL_COMMUNITY)
Admission: RE | Admit: 2018-02-09 | Discharge: 2018-02-09 | Disposition: A | Discharge status: Home / Self Care | Payer: Medicaid Other | Source: Ambulatory Visit | Attending: Advanced Practice Midwife | Admitting: Advanced Practice Midwife

## 2018-02-09 DIAGNOSIS — O09293 Supervision of pregnancy with other poor reproductive or obstetric history, third trimester: Secondary | ICD-10-CM | POA: Diagnosis not present

## 2018-02-09 DIAGNOSIS — O361930 Maternal care for other isoimmunization, third trimester, not applicable or unspecified: Secondary | ICD-10-CM | POA: Diagnosis present

## 2018-02-09 DIAGNOSIS — Z3A31 31 weeks gestation of pregnancy: Secondary | ICD-10-CM | POA: Diagnosis present

## 2018-02-09 DIAGNOSIS — Z3480 Encounter for supervision of other normal pregnancy, unspecified trimester: Secondary | ICD-10-CM

## 2018-02-09 DIAGNOSIS — O34219 Maternal care for unspecified type scar from previous cesarean delivery: Secondary | ICD-10-CM | POA: Insufficient documentation

## 2018-02-09 DIAGNOSIS — O361920 Maternal care for other isoimmunization, second trimester, not applicable or unspecified: Secondary | ICD-10-CM

## 2018-02-09 DIAGNOSIS — O09292 Supervision of pregnancy with other poor reproductive or obstetric history, second trimester: Secondary | ICD-10-CM

## 2018-02-09 MED ORDER — BETAMETHASONE SOD PHOS & ACET 6 (3-3) MG/ML IJ SUSP
12.0000 mg | Freq: Once | INTRAMUSCULAR | Status: AC
  Administered 2018-02-09: 12 mg via INTRAMUSCULAR
  Filled 2018-02-09: qty 2

## 2018-02-10 ENCOUNTER — Ambulatory Visit (HOSPITAL_COMMUNITY): Payer: Medicaid Other

## 2018-02-10 ENCOUNTER — Ambulatory Visit (HOSPITAL_COMMUNITY): Payer: Medicaid Other | Attending: Student

## 2018-02-14 ENCOUNTER — Encounter: Payer: Self-pay | Admitting: Advanced Practice Midwife

## 2018-02-15 ENCOUNTER — Encounter: Payer: Self-pay | Admitting: Advanced Practice Midwife

## 2018-02-17 ENCOUNTER — Encounter: Payer: Self-pay | Admitting: Obstetrics and Gynecology

## 2018-02-21 ENCOUNTER — Encounter: Payer: Self-pay | Admitting: *Deleted

## 2018-02-23 ENCOUNTER — Other Ambulatory Visit: Payer: Self-pay

## 2018-03-02 ENCOUNTER — Encounter: Payer: Self-pay | Admitting: Obstetrics & Gynecology

## 2018-03-09 ENCOUNTER — Encounter: Payer: Self-pay | Admitting: *Deleted

## 2018-03-16 ENCOUNTER — Ambulatory Visit: Payer: Self-pay | Admitting: Family Medicine

## 2018-03-17 ENCOUNTER — Encounter: Payer: Self-pay | Admitting: Obstetrics & Gynecology

## 2018-03-24 ENCOUNTER — Encounter: Payer: Self-pay | Admitting: Family Medicine

## 2018-03-31 ENCOUNTER — Encounter: Payer: Self-pay | Admitting: Obstetrics and Gynecology

## 2018-03-31 ENCOUNTER — Encounter: Payer: Self-pay | Admitting: Family Medicine

## 2018-03-31 ENCOUNTER — Ambulatory Visit: Payer: Self-pay | Admitting: Family Medicine

## 2018-04-07 ENCOUNTER — Encounter: Payer: Self-pay | Admitting: Obstetrics & Gynecology

## 2018-04-25 ENCOUNTER — Encounter: Payer: Self-pay | Admitting: Nurse Practitioner

## 2018-04-25 ENCOUNTER — Encounter: Payer: Self-pay | Admitting: General Practice

## 2018-04-25 ENCOUNTER — Ambulatory Visit (INDEPENDENT_AMBULATORY_CARE_PROVIDER_SITE_OTHER): Payer: Medicaid Other | Admitting: Nurse Practitioner

## 2018-04-25 VITALS — BP 114/69 | HR 79 | Ht 68.0 in | Wt 215.0 lb

## 2018-04-25 DIAGNOSIS — Z3041 Encounter for surveillance of contraceptive pills: Secondary | ICD-10-CM | POA: Diagnosis not present

## 2018-04-25 MED ORDER — NORGESTIMATE-ETH ESTRADIOL 0.25-35 MG-MCG PO TABS
1.0000 | ORAL_TABLET | Freq: Every day | ORAL | 2 refills | Status: DC
Start: 2018-04-25 — End: 2020-05-14

## 2018-04-25 NOTE — Progress Notes (Signed)
   GYNECOLOGY OFFICE VISIT NOTE   History:  32 y.o. [id number] here today for pill refill.  She is too late for a postpartum checkup.  Baby was born preterm at 7429w3d on 02-10-18.  Baby has gone home from the NICU and she stopped pumping her breastmilk.  She is currently taking Camilla.  Records from [facility] reviewed.  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  The baby's heart rate dropped and she had a general anesthesia for emergency C/S.  She denies any abnormal vaginal discharge, bleeding, pelvic pain or other concerns.   Of note, on those records, she reported having a DVT.  When reviewed with her here, she states it was a misunderstanding and she denies ever having a DVT.  No contraindications for taking pills.  She has taking combined pills before with no problem.  Past Medical History:  Diagnosis Date  . MVP (mitral valve prolapse)   . Pregnant   . Preterm labor   . Thrombocytopenia (HCC)     Past Surgical History:  Procedure Laterality Date  . CESAREAN SECTION N/A 12/29/2013   Procedure: CESAREAN SECTION;  Surgeon: Allie BossierMyra C Dove, MD;  Location: [facility];  Service: Obstetrics;  Laterality: N/A;  . REPAIR VAGINAL CUFF N/A 12/30/2013   Procedure: REPAIR VAGINAL CUFF;  Surgeon: Allie BossierMyra C Dove, MD;  Location: [facility];  Service: Gynecology;  Laterality: N/A;  Insertion of Bakri Balloon    The following portions of the patient's history were reviewed and updated as appropriate: allergies, current medications, past family history, past medical history, past social history, past surgical history and problem list.   Health Maintenance:  Normal pap on 07-20-16.    Review of Systems:  Pertinent items noted in HPI and remainder of comprehensive ROS otherwise negative.  Objective:  Physical Exam BP 114/69   Pulse 79   Ht 5\' 8"  (1.727 m)   Wt 215 lb (97.5 kg)   LMP 07/05/2017 (Exact Date)   BMI 32.69 kg/m  CONSTITUTIONAL: Well-developed,  well-nourished female in no acute distress.  HENT:  Normocephalic, atraumatic. External right and left ear normal.  EYES: Conjunctivae and EOM are normal. Pupils are equal, round. No scleral icterus.  NECK: Normal range of motion, supple, no masses SKIN: Skin is warm and dry. No rash noted. Not diaphoretic. No erythema. No pallor. NEUROLOGIC: Alert and oriented to person, place, and time. Normal reflexes, muscle tone coordination. No cranial nerve deficit noted. PSYCHIATRIC: Normal mood and affect. Normal behavior. Normal judgment and thought content. CARDIOVASCULAR: Normal heart rate noted RESPIRATORY: Effort and breath sounds normal, no problems with respiration noted PELVIC: Deferred MUSCULOSKELETAL: Normal range of motion. No edema noted.  Labs and Imaging No results found.  Assessment & Plan:  Encounter for surveillance of birth control pills.  Prescribed combined pills for 3 months.  Will need BP check and pill refill in 3 months.  Routine preventative health maintenance measures emphasized. Please refer to After Visit Summary for other counseling recommendations.   Return in about 3 months (around 07/26/2018).   Total face-to-face time with patient: 15 minutes.  Over 50% of encounter was spent on counseling and coordination of care.  Nolene BernheimERRI Sarajane Fambrough, RN, MSN, NP-BC Nurse Practitioner, [facility], [facility] 04/25/2018 6:24 PM

## 2018-06-25 ENCOUNTER — Encounter (HOSPITAL_COMMUNITY): Payer: Self-pay | Admitting: Urgent Care

## 2018-06-25 ENCOUNTER — Ambulatory Visit (HOSPITAL_COMMUNITY)
Admission: EM | Admit: 2018-06-25 | Discharge: 2018-06-25 | Disposition: A | Discharge status: Home / Self Care | Payer: Medicaid Other | Attending: Urgent Care | Admitting: Urgent Care

## 2018-06-25 ENCOUNTER — Other Ambulatory Visit: Payer: Self-pay

## 2018-06-25 DIAGNOSIS — M25531 Pain in right wrist: Secondary | ICD-10-CM

## 2018-06-25 DIAGNOSIS — W25XXXA Contact with sharp glass, initial encounter: Secondary | ICD-10-CM

## 2018-06-25 DIAGNOSIS — S61511A Laceration without foreign body of right wrist, initial encounter: Secondary | ICD-10-CM

## 2018-06-25 NOTE — ED Provider Notes (Signed)
  MRN: [id number] DOB: 03-12-1986  Subjective:   Michele Maldonado is a 32 y.o. female presenting for suffering a laceration to her right wrist from a vein that had glass and fell onto her wrist.  Patient came to the clinic immediately.  She states that her Tdap was updated 4 to 5 months ago.  Pain is sharp, severe over her wound only.  Denies active bleeding, loss of range of motion for her wrist and hand, swelling.  She reports that she has never smoked. She has never used smokeless tobacco. She reports that she does not drink alcohol or use drugs.   No current facility-administered medications for this encounter.   Current Outpatient Medications:  .  norethindrone (MICRONOR,CAMILA,ERRIN) 0.35 MG tablet, Take 1 tablet by mouth daily., Disp: , Rfl:  .  norgestimate-ethinyl estradiol (ORTHO-CYCLEN,SPRINTEC,PREVIFEM) 0.25-35 MG-MCG tablet, Take 1 tablet by mouth daily., Disp: 1 Package, Rfl: 2   No Known Allergies  Past Medical History:  Diagnosis Date  . MVP (mitral valve prolapse)   . Pregnant   . Preterm labor   . Thrombocytopenia (HCC)      Past Surgical History:  Procedure Laterality Date  . CESAREAN SECTION N/A 12/29/2013   Procedure: CESAREAN SECTION;  Surgeon: Allie BossierMyra C Dove, MD;  Location: [facility];  Service: Obstetrics;  Laterality: N/A;  . REPAIR VAGINAL CUFF N/A 12/30/2013   Procedure: REPAIR VAGINAL CUFF;  Surgeon: Allie BossierMyra C Dove, MD;  Location: [facility];  Service: Gynecology;  Laterality: N/A;  Insertion of Bakri Balloon    Objective:   Vitals: BP 118/66 (BP Location: Left Arm)   Pulse 68   Temp 98.6 F (37 C) (Oral)   Resp 18   SpO2 100%   Physical Exam  Constitutional: She is oriented to person, place, and time. She appears well-developed and well-nourished.  Cardiovascular: Normal rate.  Pulmonary/Chest: Effort normal.  Neurological: She is alert and oriented to person, place, and time.  Skin:       PROCEDURE NOTE: laceration repair Verbal consent obtained from  patient.  Local anesthesia with 5cc Lidocaine 2% with epinephrine.  Wound explored for tendon, ligament damage. Wound scrubbed with soap and water and rinsed. Wound closed with #6 4-0 Ethilon (simple interrupted and horizontal mattress) sutures.  Wound cleansed and dressed.   Assessment and Plan :   Wrist laceration, right, initial encounter  Acute pain of right wrist  Laceration repaired successfully.  Wound care reviewed.  Patient to return to clinic in 7 to 10 days for suture removal.   Wallis BambergMani, Rhiannon Sassaman, PA-C 06/26/18 0809

## 2018-06-25 NOTE — Discharge Instructions (Signed)

## 2018-06-25 NOTE — ED Notes (Signed)
Non adherent dressing and coband applied to pt's right wrist.

## 2018-06-25 NOTE — ED Notes (Signed)
Wound cleaned with Saf-Clean.

## 2018-06-25 NOTE — ED Triage Notes (Signed)
Pt states a frame fell off the wall and the glass cut the top of her right wrist.

## 2018-06-26 ENCOUNTER — Encounter (HOSPITAL_COMMUNITY): Payer: Self-pay | Admitting: Urgent Care

## 2018-07-03 IMAGING — US US MFM MCA DOPPLER
1 series · 15 of 21 positions shown · non-contrast
Comparison: none

[Series 1: us mfm mca doppler · 15 of 21 slices shown]
[im 1/21]
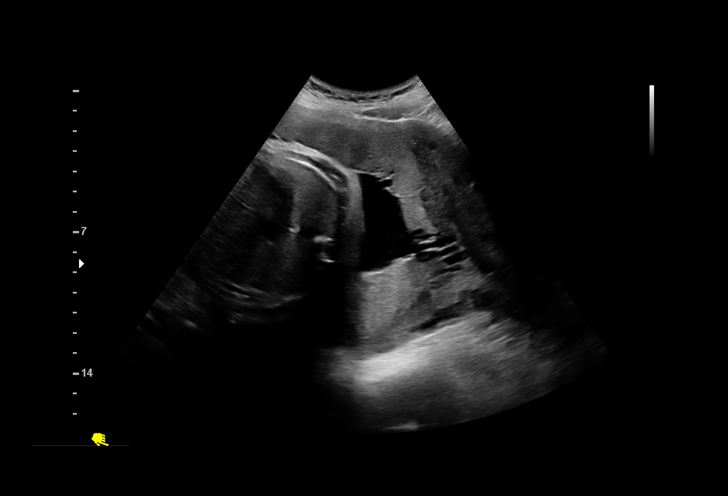
[im 3/21]
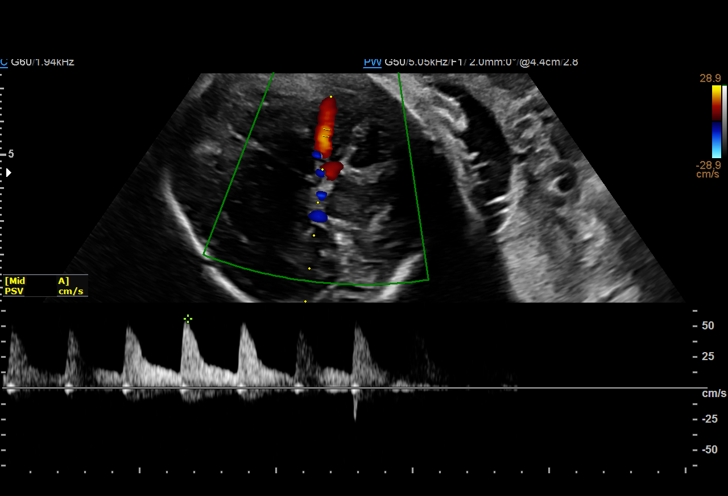
[im 4/21]
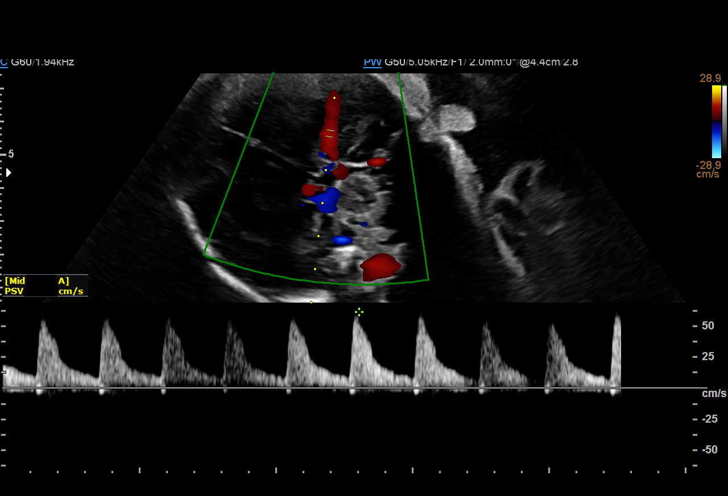
[im 5/21]
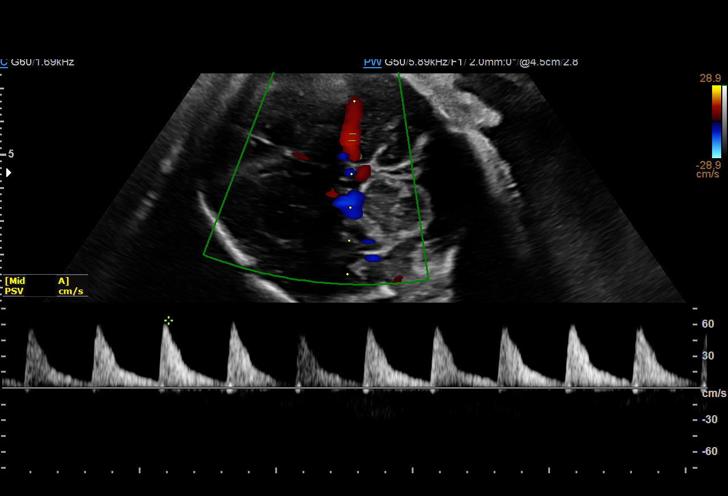
[im 7/21]
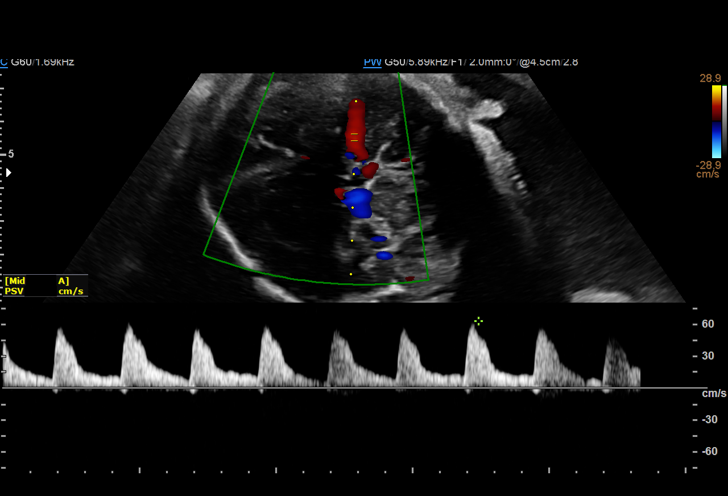
[im 8/21]
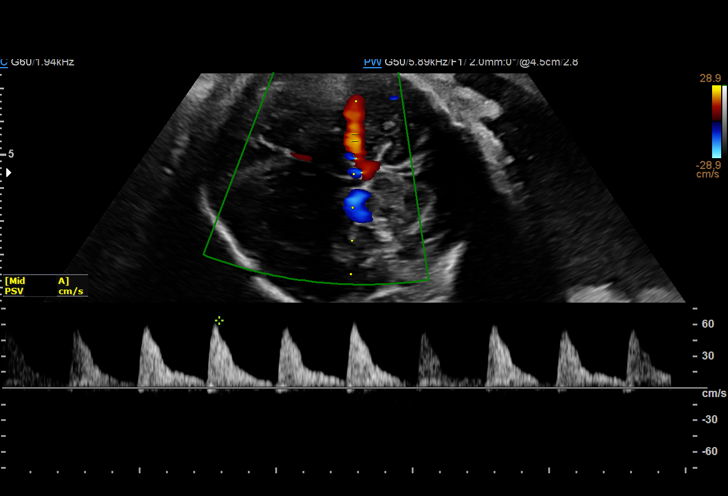
[im 10/21]
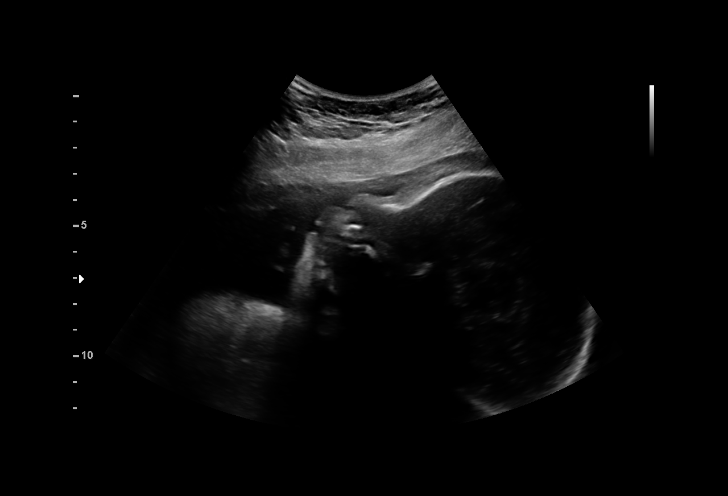
[im 11/21]
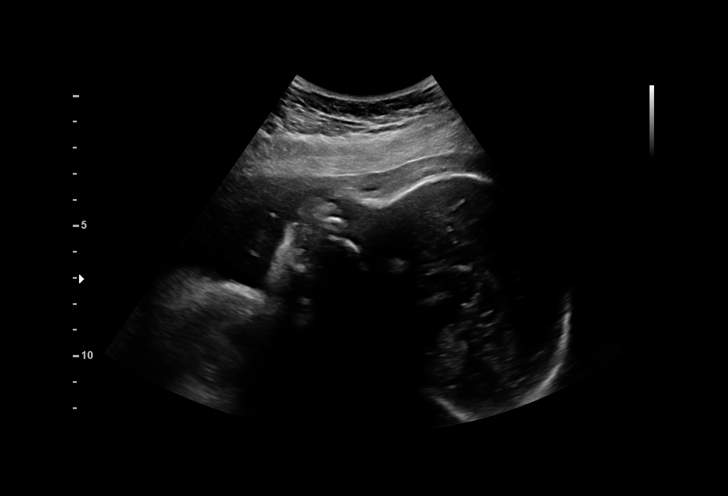
[im 12/21]
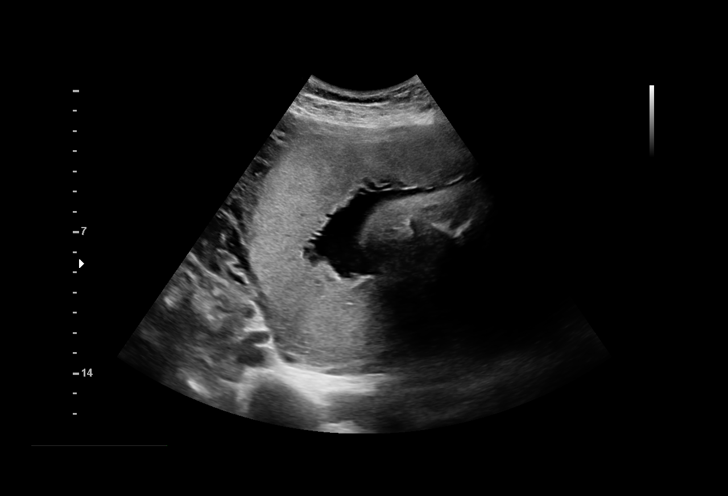
[im 14/21]
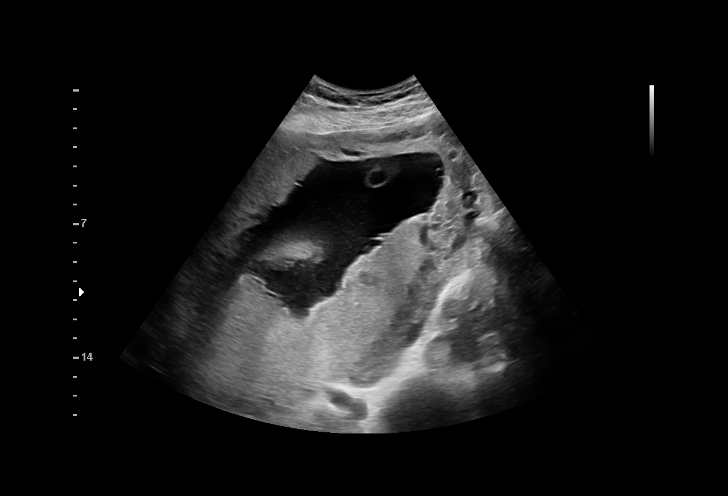
[im 15/21]
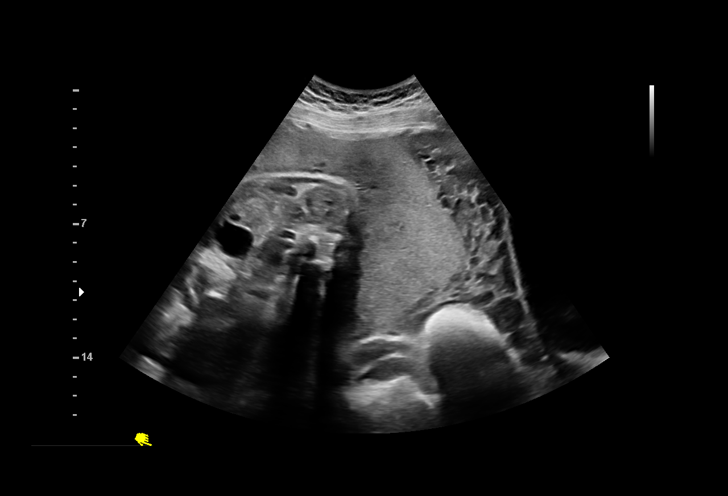
[im 17/21]
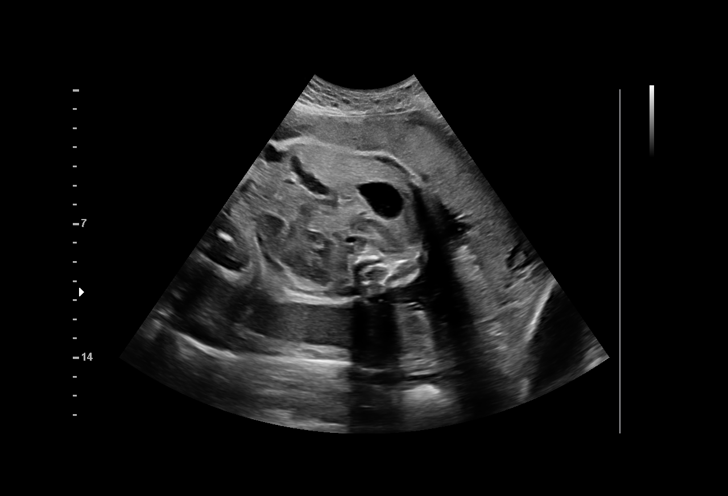
[im 18/21]
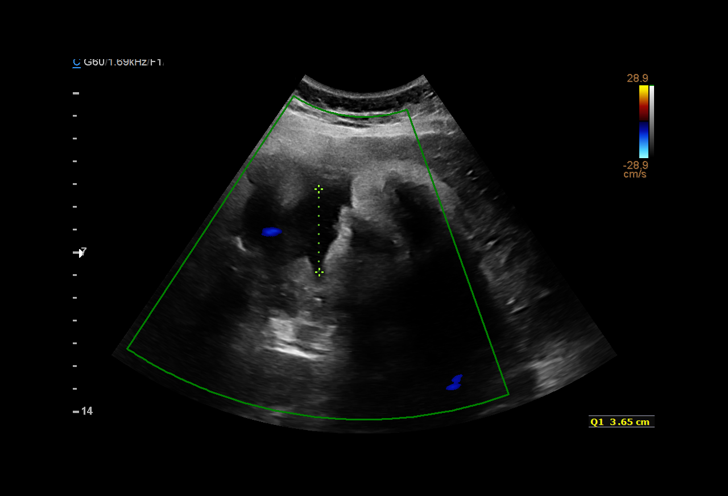
[im 19/21]
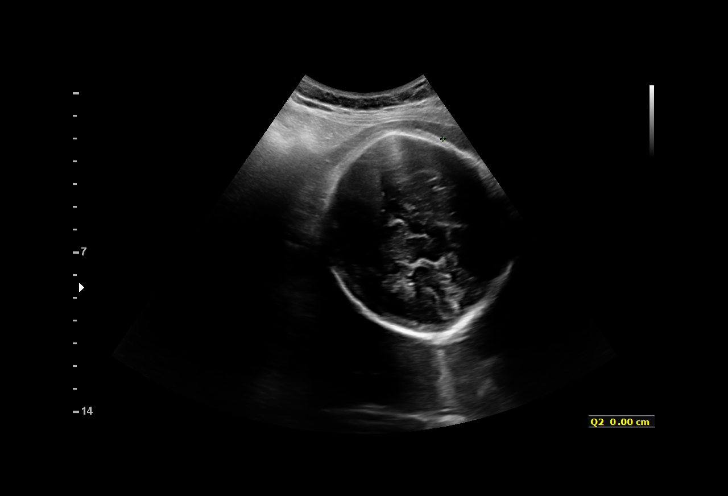
[im 21/21]
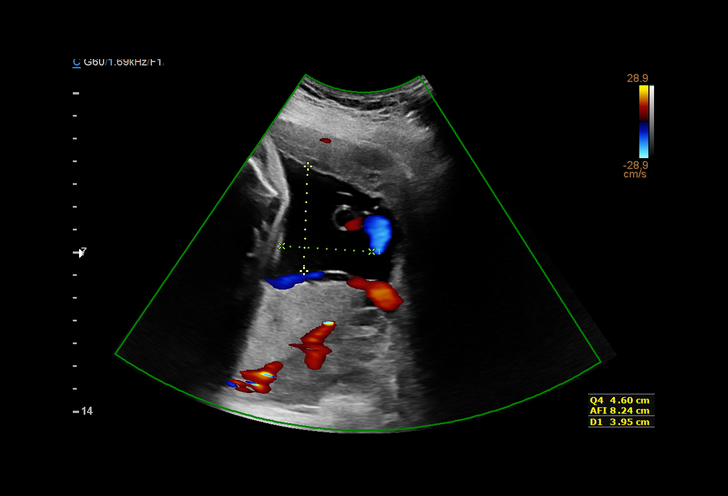

[15 of 21 positions shown; findings below may reference images not displayed]

OB/Gyn Clinic

1  US MFM MCA DOPPLER                          76821.01

1  JAROM GALVIS              [id number]      [id number]     [id number]
Indications

31 weeks gestation of pregnancy
Seong isoimmunization during pregnancy in
second trimester, single fetus; titer: too weak
to titer; FOB status: K/k; 50% chance fetus
at risk for anemia
2 vessel umbilical cord
History of cesarean delivery, currently
pregnant
Poor obstetric history: Previous preterm
delivery, antepartum (@88w1d)
Poor obstetric history: Prior fetal
macrosomia, antepartum (4,394g)
OB History

Blood Type:            Height:  5'8"   Weight (lb):  183       BMI:
Gravidity:    6         Term:   4        Prem:   1
Living:       5
Fetal Evaluation

Num Of Fetuses:     1
Fetal Heart         124
Rate(bpm):
Cardiac Activity:   Observed
Presentation:       Transverse, head to maternal left
Amniotic Fluid
AFI FV:      Subjectively within normal limits

AFI Sum(cm)     %Tile       Largest Pocket(cm)
8.25            4

RUQ(cm)       RLQ(cm)       LUQ(cm)        LLQ(cm)
3.65          4.6           0              0
Gestational Age

LMP:           31w 2d        Date:  07/05/17                 EDD:   04/11/18
Best:          31w 2d     Det. By:  LMP  (07/05/17)          EDD:   04/11/18
Doppler - Fetal Vessels

Middle Cerebral Artery
PSV    MoM
(cm/s)
64.3

Cervix Uterus Adnexa

Cervix
Not visualized (advanced GA >76wks)
Impression

IUP at 31+2 weeks with Seong isoimmunization and elevated
MCA dopplers yesterday
Cephalic presentation
Right lateral placenta without previa
MCA peak systolic velocities obtained with MoM from
Recommendations

For PUBS tomorrow.

## 2018-07-05 ENCOUNTER — Ambulatory Visit (HOSPITAL_COMMUNITY)
Admission: EM | Admit: 2018-07-05 | Discharge: 2018-07-05 | Disposition: A | Discharge status: Home / Self Care | Payer: Medicaid Other

## 2018-07-05 DIAGNOSIS — Z4802 Encounter for removal of sutures: Secondary | ICD-10-CM

## 2018-07-05 DIAGNOSIS — S61511D Laceration without foreign body of right wrist, subsequent encounter: Secondary | ICD-10-CM

## 2018-07-05 NOTE — ED Notes (Signed)
Bed: UC01 Expected date:  Expected time:  Means of arrival:  Comments: Hold for appt 

## 2018-07-05 NOTE — ED Triage Notes (Signed)
Pt here for 6 stiches removed from R wrist. Wound well healed, no bleeding. Pt had no further questions. 6 stitches removed.

## 2019-01-02 ENCOUNTER — Encounter: Payer: Self-pay | Admitting: Student

## 2019-01-02 ENCOUNTER — Ambulatory Visit: Payer: Self-pay | Admitting: Student

## 2019-01-17 ENCOUNTER — Ambulatory Visit: Payer: Self-pay | Admitting: Nurse Practitioner

## 2020-01-31 ENCOUNTER — Emergency Department (HOSPITAL_COMMUNITY): Payer: Self-pay

## 2020-01-31 ENCOUNTER — Encounter (HOSPITAL_COMMUNITY): Payer: Self-pay | Admitting: Emergency Medicine

## 2020-01-31 ENCOUNTER — Other Ambulatory Visit: Payer: Self-pay

## 2020-01-31 ENCOUNTER — Emergency Department (HOSPITAL_COMMUNITY)
Admission: EM | Admit: 2020-01-31 | Discharge: 2020-01-31 | Disposition: A | Discharge status: Home / Self Care | Payer: Self-pay | Attending: Emergency Medicine | Admitting: Emergency Medicine

## 2020-01-31 DIAGNOSIS — R202 Paresthesia of skin: Secondary | ICD-10-CM | POA: Diagnosis not present

## 2020-01-31 DIAGNOSIS — Y93I9 Activity, other involving external motion: Secondary | ICD-10-CM | POA: Diagnosis not present

## 2020-01-31 DIAGNOSIS — Y999 Unspecified external cause status: Secondary | ICD-10-CM | POA: Diagnosis not present

## 2020-01-31 DIAGNOSIS — Z79899 Other long term (current) drug therapy: Secondary | ICD-10-CM | POA: Insufficient documentation

## 2020-01-31 DIAGNOSIS — Y92410 Unspecified street and highway as the place of occurrence of the external cause: Secondary | ICD-10-CM | POA: Insufficient documentation

## 2020-01-31 LAB — POC URINE PREG, ED: Preg Test, Ur: NEGATIVE

## 2020-01-31 MED ORDER — IBUPROFEN 400 MG PO TABS
600.0000 mg | ORAL_TABLET | Freq: Once | ORAL | Status: AC
  Administered 2020-01-31: 600 mg via ORAL
  Filled 2020-01-31: qty 1
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
## 2020-01-31 NOTE — ED Notes (Signed)
Patient transported to X-ray 

## 2020-05-09 ENCOUNTER — Encounter (HOSPITAL_COMMUNITY): Payer: Self-pay

## 2020-05-09 ENCOUNTER — Other Ambulatory Visit: Payer: Self-pay | Admitting: Physician Assistant

## 2020-05-09 ENCOUNTER — Emergency Department (HOSPITAL_COMMUNITY)
Admission: EM | Admit: 2020-05-09 | Discharge: 2020-05-09 | Disposition: A | Discharge status: Home / Self Care | Payer: Medicaid Other | Attending: Emergency Medicine | Admitting: Emergency Medicine

## 2020-05-09 ENCOUNTER — Emergency Department (HOSPITAL_COMMUNITY): Payer: Medicaid Other

## 2020-05-09 ENCOUNTER — Other Ambulatory Visit: Payer: Self-pay

## 2020-05-09 DIAGNOSIS — Z79899 Other long term (current) drug therapy: Secondary | ICD-10-CM | POA: Diagnosis not present

## 2020-05-09 DIAGNOSIS — O99891 Other specified diseases and conditions complicating pregnancy: Secondary | ICD-10-CM | POA: Insufficient documentation

## 2020-05-09 DIAGNOSIS — Z3A00 Weeks of gestation of pregnancy not specified: Secondary | ICD-10-CM | POA: Diagnosis not present

## 2020-05-09 DIAGNOSIS — I4891 Unspecified atrial fibrillation: Secondary | ICD-10-CM

## 2020-05-09 DIAGNOSIS — R002 Palpitations: Secondary | ICD-10-CM | POA: Diagnosis not present

## 2020-05-09 DIAGNOSIS — Z3201 Encounter for pregnancy test, result positive: Secondary | ICD-10-CM

## 2020-05-09 DIAGNOSIS — Z3A01 Less than 8 weeks gestation of pregnancy: Secondary | ICD-10-CM

## 2020-05-09 HISTORY — DX: Maternal care for other isoimmunization, unspecified trimester, not applicable or unspecified: O36.1990

## 2020-05-09 HISTORY — DX: Ventricular septal defect: Q21.0

## 2020-05-09 LAB — BASIC METABOLIC PANEL
Anion gap: 10 (ref 5–15)
BUN: 7 mg/dL (ref 6–20)
CO2: 20 mmol/L — ABNORMAL LOW (ref 22–32)
Calcium: 8.6 mg/dL — ABNORMAL LOW (ref 8.9–10.3)
Chloride: 104 mmol/L (ref 98–111)
Creatinine, Ser: 0.55 mg/dL (ref 0.44–1.00)
GFR calc Af Amer: >60 mL/min (ref >60)
GFR calc non Af Amer: >60 mL/min (ref >60)
Glucose, Bld: 83 mg/dL (ref 70–99)
Potassium: 3.9 mmol/L (ref 3.5–5.1)
Sodium: 134 mmol/L — ABNORMAL LOW (ref 135–145)

## 2020-05-09 LAB — CBC
HCT: 39.3 % (ref 36.0–46.0)
Hemoglobin: 13.2 g/dL (ref 12.0–15.0)
MCH: 30.6 pg (ref 26.0–34.0)
MCHC: 33.6 g/dL (ref 30.0–36.0)
MCV: 91 fL (ref 80.0–100.0)
Platelets: 152 10*3/uL (ref 150–400)
RBC: 4.32 MIL/uL (ref 3.87–5.11)
RDW: 13.3 % (ref 11.5–15.5)
WBC: 8.8 10*3/uL (ref 4.0–10.5)
nRBC: 0 % (ref 0.0–0.2)

## 2020-05-09 LAB — TROPONIN I (HIGH SENSITIVITY): Troponin I (High Sensitivity): <2 ng/L (ref <18)

## 2020-05-09 LAB — TSH: TSH: 0.415 u[IU]/mL (ref 0.350–4.500)

## 2020-05-09 LAB — MAGNESIUM: Magnesium: 1.9 mg/dL (ref 1.7–2.4)

## 2020-05-09 LAB — I-STAT BETA HCG BLOOD, ED (MC, WL, AP ONLY): I-stat hCG, quantitative: >2000 m[IU]/mL — ABNORMAL HIGH (ref <5)

## 2020-05-09 LAB — HCG, QUANTITATIVE, PREGNANCY: hCG, Beta Chain, Quant, S: 23523 m[IU]/mL — ABNORMAL HIGH (ref <5)

## 2020-05-09 MED ORDER — PRENATAL 27-0.8 MG PO TABS: 1.0000 | ORAL_TABLET | Freq: Every day | ORAL | 2 refills | Status: AC

## 2020-05-09 NOTE — Progress Notes (Signed)
Per discussion between EDPs and Dr. Rennis Golden, plan OP echo and early f/u with atrial fib clinic I have sent a message the appropriate offices for scheduling and they will call the patient with this information. Kynsli Haapala PA-C

## 2020-05-09 NOTE — Progress Notes (Signed)
2

## 2020-05-09 NOTE — ED Provider Notes (Signed)
Medical Decision Making: Care of patient assumed from outgoing provider at  12:23 PM.  Agree with history, physical exam and plan.  See their note for further details.  Briefly, 34 y.o. female with PMH/PSH as below.  Chief Complaint  Patient presents with   Palpitations   Shortness of Breath    Past Medical History:  Diagnosis Date   Kell isoimmunization during pregnancy    a. per prior notes - h/o anti Kell alloimmunization.   MVP (mitral valve prolapse)    Nephrotic syndrome    per prior OB notes   Pregnant    Preterm labor    31w   Thrombocytopenia ([place])    Ventricular septal defect    Past Surgical History:  Procedure Laterality Date   CESAREAN SECTION N/A 12/29/2013   Procedure: CESAREAN SECTION;  Surgeon: Emily Filbert, MD;  Location: [facility];  Service: Obstetrics;  Laterality: N/A;   REPAIR VAGINAL CUFF N/A 12/30/2013   Procedure: REPAIR VAGINAL CUFF;  Surgeon: Emily Filbert, MD;  Location: [facility];  Service: Gynecology;  Laterality: N/A;  Insertion of Bakri Balloon     Results for orders placed or performed during the hospital encounter of 08/65/78  Basic metabolic panel  Result Value Ref Range   Sodium 134 (L) 135 - 145 mmol/L   Potassium 3.9 3.5 - 5.1 mmol/L   Chloride 104 98 - 111 mmol/L   CO2 20 (L) 22 - 32 mmol/L   Glucose, Bld 83 70 - 99 mg/dL   BUN 7 6 - 20 mg/dL   Creatinine, Ser 0.55 0.44 - 1.00 mg/dL   Calcium 8.6 (L) 8.9 - 10.3 mg/dL   GFR calc non Af Amer >60 >60 mL/min   GFR calc Af Amer >60 >60 mL/min   Anion gap 10 5 - 15  CBC  Result Value Ref Range   WBC 8.8 4.0 - 10.5 K/uL   RBC 4.32 3.87 - 5.11 MIL/uL   Hemoglobin 13.2 12.0 - 15.0 g/dL   HCT 39.3 36 - 46 %   MCV 91.0 80.0 - 100.0 fL   MCH 30.6 26.0 - 34.0 pg   MCHC 33.6 30.0 - 36.0 g/dL   RDW 13.3 11.5 - 15.5 %   Platelets 152 150 - 400 K/uL   nRBC 0.0 0.0 - 0.2 %  TSH  Result Value Ref Range   TSH 0.415 0.350 - 4.500 uIU/mL  Magnesium  Result Value Ref Range   Magnesium 1.9  1.7 - 2.4 mg/dL  hCG, quantitative, pregnancy  Result Value Ref Range   hCG, Beta Chain, Quant, S 23,523 (H) <5 mIU/mL  I-Stat beta hCG blood, ED  Result Value Ref Range   I-stat hCG, quantitative >2,000.0 (H) <5 mIU/mL   Comment 3          Troponin I (High Sensitivity)  Result Value Ref Range   Troponin I (High Sensitivity) <2 <18 ng/L   DG Chest 2 View  Result Date: 05/09/2020 CLINICAL DATA:  Shortness of breath that started last night EXAM: CHEST - 2 VIEW COMPARISON:  January 31, 2020 FINDINGS: Cardiomediastinal contours and hilar structures are normal. Lungs are clear.  No sign of effusion. On limited assessment skeletal structures without acute bone finding. Mild dextroconvex curvature of the thoracic spine. IMPRESSION: No acute cardiopulmonary disease. Electronically Signed   By: Zetta Bills M.D.   On: 05/09/2020 10:33   US OB Comp AddL Gest Less 14 Wks  Result Date: 05/09/2020 CLINICAL DATA:  Positive  pregnancy test EXAM: TWIN OBSTETRIC <14WK [place] AND TRANSVAGINAL OB US COMPARISON:  None. FINDINGS: Number of IUPs:  2 Chorionicity/Amnionicity:  Dichorionic-diamniotic (thick membrane) TWIN 1 Yolk sac:  Visualized. Embryo:  Visualized. Cardiac Activity: Visualized. Heart Rate: 143 bpm CRL: 3.8 mm   6 w 0 d                  [place] EDC: 01/02/2021 TWIN 2 Yolk sac:  Visualized. Embryo:  Visualized. Cardiac Activity: Visualized. Heart Rate: 140 bpm CRL: 3.7 mm   6 w 0 d                  [place] EDC: 01/02/2021 Subchorionic hemorrhage:  None visualized. Maternal uterus/adnexae: Unremarkable. Right ovary only seen transabdominally due to location. Trace free fluid. IMPRESSION: Two live (dichorionic-diamniotic) intrauterine pregnancies as described. Electronically Signed   By: Guadlupe Spanish M.D.   On: 05/09/2020 15:32   US OB LESS THAN 14 WEEKS WITH OB TRANSVAGINAL  Result Date: 05/09/2020 CLINICAL DATA:  Positive pregnancy test EXAM: TWIN OBSTETRIC <14WK [place] AND TRANSVAGINAL OB US COMPARISON:  None.  FINDINGS: Number of IUPs:  2 Chorionicity/Amnionicity:  Dichorionic-diamniotic (thick membrane) TWIN 1 Yolk sac:  Visualized. Embryo:  Visualized. Cardiac Activity: Visualized. Heart Rate: 143 bpm CRL: 3.8 mm   6 w 0 d                  [place] EDC: 01/02/2021 TWIN 2 Yolk sac:  Visualized. Embryo:  Visualized. Cardiac Activity: Visualized. Heart Rate: 140 bpm CRL: 3.7 mm   6 w 0 d                  [place] EDC: 01/02/2021 Subchorionic hemorrhage:  None visualized. Maternal uterus/adnexae: Unremarkable. Right ovary only seen transabdominally due to location. Trace free fluid. IMPRESSION: Two live (dichorionic-diamniotic) intrauterine pregnancies as described. Electronically Signed   By: Guadlupe Spanish M.D.   On: 05/09/2020 15:32    Medications - No data to display  Clinical Course as of May 10 1222  Fri May 09, 2020  1506 New onset afib. Pregnant. Vague cardiac history. Cards consulted, cards to see. At [place]. Rate controlled.    [AL]    Clinical Course User Index [AL] Janeece Fitting, MD    Interval MDM: Patient's ultrasound with 2 live intrauterine pregnancies.  Cardiology evaluated the patient.  Patient is extremely low risk for stroke.  As patient is rate controlled with stable vital signs she is appropriate for outpatient follow-up.  Strict return precautions given for unstable vital signs/syncope/dizziness/tachycardia, discussed possible over-the-counter monitoring with apple watch or Fitbit for patient's pulse.  Cardiology arranged for outpatient echocardiogram likely Monday.  Patient has OB/GYN of her own but she was provided resources for [employer] [facility].  Patient prescribed prenatal vitamins.  Also discussed patient's diagnosis with her mother at request of patient.  Diagnosis, treatment and plan of care was discussed and agreed upon with patient.  Patient comfortable with discharge at this time.    Janeece Fitting, MD 05/10/20 1223    Benjiman Core, MD 05/10/20 2005

## 2020-05-09 NOTE — ED Triage Notes (Signed)
Pt reports sob that started last night while laying in bed, pt then woke up this morning feeling heart palpitations. States she had she 2nd COVID vaccine on 5/11. Resp e.u at this time

## 2020-05-09 NOTE — ED Provider Notes (Signed)
[facility] EMERGENCY DEPARTMENT Provider Note   CSN: [id number] Arrival date & time: 05/09/20  5732     History Chief Complaint  Patient presents with  . Palpitations  . Shortness of Breath    Michele Maldonado is a 34 y.o. [phone] female who presents with SOB and palpitations. She states that last night she felt a little SOB but didn't think much of it. She woke up this morning to go to work. While there she had SOB and palpitations. It felt like anxiety. It comes and goes. Currently she feels fine but her symptoms were concerning her so she came to the ED. 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 She denies syncope, headache, chest pain, cough, abdominal pain, vomiting. Her LMP was around June 1. She ran out of her oral birth control several weeks ago. She reports a lot of caffeine use and smokes marijuana. She denies excessive ETOH use or other drug use. 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  HPI     Past Medical History:  Diagnosis Date  . MVP (mitral valve prolapse)   . Pregnant   . Preterm labor   . Thrombocytopenia [facility])     Patient Active Problem List   Diagnosis Date Noted  . Death of child 2018/02/06  . Mitral valve prolapse 01/10/2018  . History of nephrotic syndrome 10/26/2016  . Marijuana abuse 07/24/2016  . Hx of thrombocytopenia 12/27/2013    Past Surgical History:  Procedure Laterality Date  . CESAREAN SECTION N/A 12/29/2013   Procedure: CESAREAN SECTION;  Surgeon: Allie Bossier, MD;  Location: [facility];  Service: Obstetrics;  Laterality: N/A;  . REPAIR VAGINAL CUFF N/A 12/30/2013   Procedure: REPAIR VAGINAL CUFF;  Surgeon: Allie Bossier, MD;  Location: [facility];  Service: Gynecology;  Laterality: N/A;  Insertion of Bakri Balloon     OB History    Gravida  6   Para  5   Term  4   Preterm  1   AB  0   Living  4     SAB   0   TAB  0   Ectopic  0   Multiple  0   Live Births  5        Obstetric Comments  Child born in 2015-03-27 deceased --- died at 65 months old, taken to ED unresponsive -- pt not ready to talk         Family History  Problem Relation Age of Onset  . Hypertension Maternal Grandmother   . Diabetes Maternal Grandmother   . Thyroid disease Mother     Social History   Tobacco Use  . Smoking status: Never Smoker  . Smokeless tobacco: Never Used  Vaping Use  . Vaping Use: Never used  Substance Use Topics  . Alcohol use: No  . Drug use: No    Types: Marijuana    Comment: former    Home Medications Prior to Admission medications   Medication Sig Start Date End Date Taking? Authorizing Provider  acetaminophen (TYLENOL) 500 MG tablet Take 1,000 mg by mouth every 6 (six) hours as needed for mild pain.   Yes [provider]  aspirin 325 MG tablet Take 325 mg by mouth once.   Yes [provider]  ibuprofen (ADVIL) 200 MG tablet Take 400  mg by mouth every 6 (six) hours as needed for headache or moderate pain.   Yes [provider]  norgestimate-ethinyl estradiol (ORTHO-CYCLEN,SPRINTEC,PREVIFEM) 0.25-35 MG-MCG tablet Take 1 tablet by mouth daily. 04/25/18  Yes Burleson, Rona Ravens, NP    Allergies    Patient has no known allergies.  Review of Systems   Review of Systems  Constitutional: Negative for chills and fever.  HENT: Positive for congestion.   Respiratory: Negative for cough, shortness of breath and wheezing.   Cardiovascular: Positive for palpitations. Negative for chest pain and leg swelling.  Gastrointestinal: Positive for nausea. Negative for abdominal pain and vomiting.  Neurological: Positive for light-headedness.  All other systems reviewed and are negative.   Physical Exam Updated Vital Signs BP 113/78   Pulse 90   Temp 99 F (37.2 C) (Oral)   Resp 17   Ht 5\' 9"  (1.753 m)   Wt 68 kg   LMP 04/16/2020   SpO2 100%   BMI 22.15 kg/m     Physical Exam Vitals and nursing note reviewed.  Constitutional:      General: She is not in acute distress.    Appearance: Normal appearance. She is well-developed. She is not ill-appearing.  HENT:     Head: Normocephalic and atraumatic.  Eyes:     General: No scleral icterus.       Right eye: No discharge.        Left eye: No discharge.     Conjunctiva/sclera: Conjunctivae normal.     Pupils: Pupils are equal, round, and reactive to light.  Cardiovascular:     Rate and Rhythm: Normal rate. Rhythm irregularly irregular.  Pulmonary:     Effort: Pulmonary effort is normal. No respiratory distress.     Breath sounds: Normal breath sounds.  Abdominal:     General: There is no distension.     Palpations: Abdomen is soft.     Tenderness: There is no abdominal tenderness.  Musculoskeletal:     Cervical back: Normal range of motion.     Right lower leg: No edema.     Left lower leg: No edema.  Skin:    General: Skin is warm and dry.  Neurological:     Mental Status: She is alert and oriented to person, place, and time.  Psychiatric:        Behavior: Behavior normal.     ED Results / Procedures / Treatments   Labs (all labs ordered are listed, but only abnormal results are displayed) Labs Reviewed  BASIC METABOLIC PANEL - Abnormal; Notable for the following components:      Result Value   Sodium 134 (*)    CO2 20 (*)    Calcium 8.6 (*)    All other components within normal limits  HCG, QUANTITATIVE, PREGNANCY - Abnormal; Notable for the following components:   hCG, Beta Chain, Quant, S 23,523 (*)    All other components within normal limits  I-STAT BETA HCG BLOOD, ED ([facility], [facility], [facility] ONLY) - Abnormal; Notable for the following components:   I-stat hCG, quantitative >2,000.0 (*)    All other components within normal limits  CBC  TSH  MAGNESIUM  TROPONIN I (HIGH SENSITIVITY)    EKG EKG Interpretation  Date/Time:  Friday May 09 2020 12:18:28 EDT Ventricular Rate:   86 PR Interval:    QRS Duration: 74 QT Interval:  357 QTC Calculation: 427 R Axis:   71 Text Interpretation: Atrial fibrillation Confirmed by Quintella Reichert [phone]) on  05/09/2020 1:27:47 PM   Radiology DG Chest 2 View  Result Date: 05/09/2020 CLINICAL DATA:  Shortness of breath that started last night EXAM: CHEST - 2 VIEW COMPARISON:  January 31, 2020 FINDINGS: Cardiomediastinal contours and hilar structures are normal. Lungs are clear.  No sign of effusion. On limited assessment skeletal structures without acute bone finding. Mild dextroconvex curvature of the thoracic spine. IMPRESSION: No acute cardiopulmonary disease. Electronically Signed   By: Donzetta Kohut M.D.   On: 05/09/2020 10:33    Procedures Procedures (including critical care time)  Medications Ordered in ED Medications - No data to display  ED Course  I have reviewed the triage vital signs and the nursing notes.  Pertinent labs & imaging results that were available during my care of the patient were reviewed by me and considered in my medical decision making (see chart for details).  34 year old female presents with palpitations and SOB that started last night. EKG is concerning for A.fib. On exam she is calm and comfortable appearing. Heart rhythm is irregularly irregular. She is rate controlled and currently asymptomatic. Lungs are CTA. Abdomen is soft and non-tender. Of note her pregnancy test is positive here. She cannot give me an exact LMP but she thinks it was a month ago. CXR obtained is negative. Will order labs, hcg quant, Pelvic US. Will consult Cardiology.  Labs are reassuring. Hcg is 23,523. Cards were consulted and will come to see. Pelvic US is pending at shift change. Care signed out to Dr. Michel Harrow at shift change who will await Cards recommendations. CHADsVASC score is 1.  MDM Rules/Calculators/A&P  CHA2DS2-VASc Score = 1 The patient's score is based upon: Female gender CHF History: 0 HTN History:  0 Age : 0 Diabetes History: 0 Stroke History: 0 Vascular Disease History: 0 Gender: 1  { ASSESSMENT AND PLAN: Persistent Atrial Fibrillation (ICD10:  I48.19) The patient's CHA2DS2-VASc score is 1, indicating a 0.6% annual risk of stroke.     Signed,  Bethel Born, PA-C    05/09/2020 2:19 PM    Final Clinical Impression(s) / ED Diagnoses Final diagnoses:  Positive pregnancy test    Rx / DC Orders ED Discharge Orders    None       Bethel Born, PA-C 05/09/20 1534    Tilden Fossa, MD 05/12/20 [phone]

## 2020-05-09 NOTE — Discharge Instructions (Addendum)
You presented to the ED with general feelings of weakness and tiredness.  You are found to be in atrial fibrillation without rapid ventricular response.  You are currently [redacted] weeks pregnant with twins.  Please follow-up with cardiology early next week, they will contact you for appointment date and time for your echocardiogram.  Follow-up with your OB/GYN for this pregnancy.  Take your prenatal vitamins.

## 2020-05-09 NOTE — Consult Note (Addendum)
Cardiology Consultation:   Patient ID: Michele Maldonado MRN: [id number]; DOB: 08-Mar-1986  Admit date: 05/09/2020 Date of Consult: 05/09/2020  Primary Care Provider: Patient, No Pcp Per [facility] Cardiologist: Chrystie Nose, MD (new) [facility] Electrophysiologist:  None    Patient Profile:   Michele Maldonado is a 34 y.o. 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 Michel Harrow.  She is also noted to be pregnant with twins, which is also newly uncovered this visit.  History of Present Illness:   Regarding prior cardiac history, Ms. Gladwell's mother (an Charity fundraiser) reports by speaker phone that they were told the patient had a VSD when she was born that was not felt to require intervention. This initially did not close but she had follow-up echocardiogram later in childhood demonstrating the VSD was no longer a concern but did demonstrate mitral valve prolapse.  She had a very complicated pregnancy with her 66 year old son in which she recalls having to be on blood thinners and seizure medications for 1 week post delivery, but does not recall ever having a clotting issue that required longer term blood thinner. Hospital notes indicate she was admitted with extensive edema in lower extremities, thighs and to vulva and was felt to have nephrotic syndrome. She required C-section at [redacted]w[redacted]d. 2D echo during that admission showed mild LVH, EF 65%, mild LAE, trivial AI, small posterior pericardial effusion. She was discharged on HCTZ that she did not appear to have required long term. In 03/28/18 she was found to have anti Kell alloimmunization. She underwent a procedure for  percutaneous umbilical blood cord sampling but had to deliver at [redacted]w[redacted]d via C-section due to fetal bradycardia during procedure. Regarding her anti Kell status, she states she required frequent sonograms to track baby's progress.  She was in her USOH recently but noticed last night she felt SOB regardless of what position she was in. She had a restless night tossing and turning. 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 She took a baby aspirin and initially hoped perhaps it was a panic attack. Due to persistent symptoms she presented to the ED where she was found to be in new onset atrial fibrillation. She reports a lot of caffeine use and smokes marijuana. She drinks rare alcohol and denies any illicit substance use. No other unusual symptoms lately such as fever, chills, edema, weight gain/loss or bleeding. However, she ran out of her birth control several weeks ago and does not recall date of LMP. She was found to be pregnant in the ED with hCG 25k. OB US demonstrates two live di-di IUP estimated dating [redacted]w[redacted]d. Labs otherwise  show sodium 134, K 3.9, CO2 20, Cr 0.55, Mg 1.9, negative troponin, normal Hgb, normal TSH. CXR NAD. She has not required any medications for rate control while in the ED. She had her 2nd Covid vaccine on 5/11. She is not hypoxic and has not had any chest pain or edema. Mother has history of atrial fib along with another more distant relative with possible diagnosis.    Past Medical History:  Diagnosis Date  . Kell isoimmunization  during pregnancy    a. per prior notes - h/o anti Kell alloimmunization.  . MVP (mitral valve prolapse)   . Nephrotic syndrome    per prior OB notes  . Pregnant   . Preterm labor    31w  . Thrombocytopenia ([place])   . Ventricular septal defect     Past Surgical History:  Procedure Laterality Date  . CESAREAN SECTION N/A 12/29/2013   Procedure: CESAREAN SECTION;   Surgeon: Emily Filbert, MD;  Location: [facility];  Service: Obstetrics;  Laterality: N/A;  . REPAIR VAGINAL CUFF N/A 12/30/2013   Procedure: REPAIR VAGINAL CUFF;  Surgeon: Emily Filbert, MD;  Location: [facility];  Service: Gynecology;  Laterality: N/A;  Insertion of Bakri Balloon     Home Medications:  Prior to Admission medications   Medication Sig Start Date End Date Taking? Authorizing Provider  acetaminophen (TYLENOL) 500 MG tablet Take 1,000 mg by mouth every 6 (six) hours as needed for mild pain.   Yes [provider]  aspirin 325 MG tablet Take 325 mg by mouth once.   Yes [provider]  ibuprofen (ADVIL) 200 MG tablet Take 400 mg by mouth every 6 (six) hours as needed for headache or moderate pain.   Yes [provider]  norgestimate-ethinyl estradiol (ORTHO-CYCLEN,SPRINTEC,PREVIFEM) 0.25-35 MG-MCG tablet Take 1 tablet by mouth daily. 04/25/18  Yes Burleson, Rona Ravens, NP    Inpatient Medications: Scheduled Meds:  Continuous Infusions:  PRN Meds:   Allergies:   No Known Allergies  Social History:   Social History   Socioeconomic History  . Marital status: Single    Spouse name: Not on file  . Number of children: Not on file  . Years of education: Not on file  . Highest education level: Not on file  Occupational History  . Not on file  Tobacco Use  . Smoking status: Never Smoker  . Smokeless tobacco: Never Used  Vaping Use  . Vaping Use: Never used  Substance and Sexual Activity  . Alcohol use: No  . Drug use: No    Types: Marijuana    Comment: former  . Sexual activity: Yes    Birth control/protection: None  Other Topics Concern  . Not on file  Social History Narrative  . Not on file   Social Determinants of Health   Financial Resource Strain:   . Difficulty of Paying Living Expenses:   Food Insecurity:   . Worried About Charity fundraiser in the Last Year:   . Arboriculturist in the Last Year:   Transportation Needs:   . Lexicographer (Medical):   Marland Kitchen Lack of Transportation (Non-Medical):   Physical Activity:   . Days of Exercise per Week:   . Minutes of Exercise per Session:   Stress:   . Feeling of Stress :   Social Connections:   . Frequency of Communication with Friends and Family:   . Frequency of Social Gatherings with Friends and Family:   . Attends Religious Services:   . Active Member of Clubs or Organizations:   . Attends Archivist Meetings:   Marland Kitchen Marital Status:   Intimate Partner Violence:   . Fear of Current or Ex-Partner:   . Emotionally Abused:   Marland Kitchen Physically Abused:   . Sexually Abused:     Family History:    Family History  Problem Relation Age of Onset  . Hypertension Maternal Grandmother   . Diabetes Maternal Grandmother   .  Thyroid disease Mother   . Atrial fibrillation Mother      ROS:  Please see the history of present illness.  All other ROS reviewed and negative.     Physical Exam/Data:   Vitals:   05/09/20 0953 05/09/20 1004 05/09/20 1317  BP: 103/73  113/78  Pulse: 88  90  Resp: 17  17  Temp: 99 F (37.2 C)    TempSrc: Oral    SpO2: 100%  100%  Weight:  68 kg   Height: 5\' 9"  (1.753 m) 5\' 9"  (1.753 m)    No intake or output data in the 24 hours ending 05/09/20 1714 Last 3 Weights 05/09/2020 01/31/2020 04/25/2018  Weight (lbs) 150 lb 141 lb 215 lb  Weight (kg) 68.04 kg 63.957 kg 97.523 kg     Body mass index is 22.15 kg/m.  Vital Signs. BP 113/78   Pulse 90   Temp 99 F (37.2 C) (Oral)   Resp 17   Ht 5\' 9"  (1.753 m)   Wt 68 kg   LMP 04/16/2020   SpO2 100%   BMI 22.15 kg/m  General: Well developed, well nourished AAF, in no acute distress. Head: Normocephalic, atraumatic, sclera non-icteric, no xanthomas, nares are without discharge. Neck: Negative for carotid bruits. JVP not elevated. Lungs: Clear bilaterally to auscultation without wheezes, rales, or rhonchi. Breathing is unlabored. Heart: Irregularly irregular, rate controlled, S1 S2  without murmurs, rubs, or gallops.  Abdomen: Soft, non-tender, non-distended with normoactive bowel sounds. No rebound/guarding. Extremities: No clubbing or cyanosis. No edema. Distal pedal pulses are 2+ and equal bilaterally. Neuro: Alert and oriented X 3. Moves all extremities spontaneously. Psych:  Responds to questions appropriately with a normal affect.   EKG:  The EKG was personally reviewed and demonstrates:  atrial fibrillation 98bpm, no acute STT changes  Telemetry:  Telemetry was personally reviewed and demonstrates: atrial fib with variable rates 80s-120s  Relevant CV Studies: 2D Echo 12/2013 - Left ventricle: The cavity size was normal. Wall thickness  was increased in a pattern of mild LVH. The estimated  ejection fraction was 65%. Wall motion was normal; there  were no regional wall motion abnormalities.  - Aortic valve: Trivial regurgitation.  - Left atrium: The atrium was mildly dilated.  - Right ventricle: The cavity size was normal. Systolic  function was normal.  - Pericardium, extracardiac: Very small posterior  pericardial effusion.    Laboratory Data:  High Sensitivity Troponin:   Recent Labs  Lab 05/09/20 1015  TROPONINIHS <2     Chemistry Recent Labs  Lab 05/09/20 1015  NA 134*  K 3.9  CL 104  CO2 20*  GLUCOSE 83  BUN 7  CREATININE 0.55  CALCIUM 8.6*  GFRNONAA >60  GFRAA >60  ANIONGAP 10    No results for input(s): PROT, ALBUMIN, AST, ALT, ALKPHOS, BILITOT in the last 168 hours. Hematology Recent Labs  Lab 05/09/20 1015  WBC 8.8  RBC 4.32  HGB 13.2  HCT 39.3  MCV 91.0  MCH 30.6  MCHC 33.6  RDW 13.3  PLT 152   BNPNo results for input(s): BNP, PROBNP in the last 168 hours.  DDimer No results for input(s): DDIMER in the last 168 hours.   Radiology/Studies:  DG Chest 2 View  Result Date: 05/09/2020 CLINICAL DATA:  Shortness of breath that started last night EXAM: CHEST - 2 VIEW COMPARISON:  January 31, 2020 FINDINGS:  Cardiomediastinal contours and hilar structures are normal. Lungs are clear.  No sign of  effusion. On limited assessment skeletal structures without acute bone finding. Mild dextroconvex curvature of the thoracic spine. IMPRESSION: No acute cardiopulmonary disease. Electronically Signed   By: Donzetta Kohut M.D.   On: 05/09/2020 10:33   US OB Comp AddL Gest Less 14 Wks  Result Date: 05/09/2020 CLINICAL DATA:  Positive pregnancy test EXAM: TWIN OBSTETRIC <14WK [place] AND TRANSVAGINAL OB US COMPARISON:  None. FINDINGS: Number of IUPs:  2 Chorionicity/Amnionicity:  Dichorionic-diamniotic (thick membrane) TWIN 1 Yolk sac:  Visualized. Embryo:  Visualized. Cardiac Activity: Visualized. Heart Rate: 143 bpm CRL: 3.8 mm   6 w 0 d                  [place] EDC: 01/02/2021 TWIN 2 Yolk sac:  Visualized. Embryo:  Visualized. Cardiac Activity: Visualized. Heart Rate: 140 bpm CRL: 3.7 mm   6 w 0 d                  [place] EDC: 01/02/2021 Subchorionic hemorrhage:  None visualized. Maternal uterus/adnexae: Unremarkable. Right ovary only seen transabdominally due to location. Trace free fluid. IMPRESSION: Two live (dichorionic-diamniotic) intrauterine pregnancies as described. Electronically Signed   By: Guadlupe Spanish M.D.   On: 05/09/2020 15:32   US OB LESS THAN 14 WEEKS WITH OB TRANSVAGINAL  Result Date: 05/09/2020 CLINICAL DATA:  Positive pregnancy test EXAM: TWIN OBSTETRIC <14WK [place] AND TRANSVAGINAL OB US COMPARISON:  None. FINDINGS: Number of IUPs:  2 Chorionicity/Amnionicity:  Dichorionic-diamniotic (thick membrane) TWIN 1 Yolk sac:  Visualized. Embryo:  Visualized. Cardiac Activity: Visualized. Heart Rate: 143 bpm CRL: 3.8 mm   6 w 0 d                  [place] EDC: 01/02/2021 TWIN 2 Yolk sac:  Visualized. Embryo:  Visualized. Cardiac Activity: Visualized. Heart Rate: 140 bpm CRL: 3.7 mm   6 w 0 d                  [place] EDC: 01/02/2021 Subchorionic hemorrhage:  None visualized. Maternal uterus/adnexae: Unremarkable. Right ovary only seen  transabdominally due to location. Trace free fluid. IMPRESSION: Two live (dichorionic-diamniotic) intrauterine pregnancies as described. Electronically Signed   By: Guadlupe Spanish M.D.   On: 05/09/2020 15:32     Assessment and Plan:   1. New onset atrial fibrillation with CHADSVASC 1 for female 2. Newly recognized di-di twin intrauterin pregnancy estimated at [redacted]w[redacted]d 3. Prior history of pregnancy complications including nephrotic syndrome, anti Kell alloimmunization and preterm labor 4. H/o VSD with spontaneous closure  5. Remote mitral valve prolapse, not seen on 2015 echocardiogram  Ms. Dolinar presents with development of SOB last night regardless of position, then palpitations this morning with documentation of atrial fibrillation which is new for her. Rate is currently 80s-90s. She has no specific evidence of heart failure/volume overload or acute murmurs on exam. She is also incidentally noted to be pregnant and [place] just revealed di-di twins. TSH is wnl, CXR nonacute, troponin negative. Complex OB history noted. Will discuss further evaluation with Dr. Rennis Golden.  For questions or updates, please contact [facility] Please consult [email or web address] for contact info under    Signed, Laurann Montana, PA-C  05/09/2020 5:14 PM

## 2020-05-14 ENCOUNTER — Ambulatory Visit (INDEPENDENT_AMBULATORY_CARE_PROVIDER_SITE_OTHER): Payer: Self-pay | Admitting: Internal Medicine

## 2020-05-14 ENCOUNTER — Other Ambulatory Visit: Payer: Self-pay

## 2020-05-14 ENCOUNTER — Encounter: Payer: Self-pay | Admitting: Internal Medicine

## 2020-05-14 VITALS — BP 121/55 | HR 75 | Ht 69.0 in | Wt 156.0 lb

## 2020-05-14 DIAGNOSIS — Z3490 Encounter for supervision of normal pregnancy, unspecified, unspecified trimester: Secondary | ICD-10-CM

## 2020-05-14 DIAGNOSIS — R0602 Shortness of breath: Secondary | ICD-10-CM

## 2020-05-14 DIAGNOSIS — I4891 Unspecified atrial fibrillation: Secondary | ICD-10-CM

## 2020-05-14 DIAGNOSIS — I480 Paroxysmal atrial fibrillation: Secondary | ICD-10-CM

## 2020-05-14 LAB — D-DIMER, QUANTITATIVE: D-DIMER: 2.42 mg/L FEU — ABNORMAL HIGH (ref 0.00–0.49)

## 2020-05-14 NOTE — Progress Notes (Signed)
ELECTROPHYSIOLOGY CONSULT NOTE  Patient ID: Michele Maldonado, MRN: [id number], DOB/AGE: November 25, 1985 34 y.o. Admit date: (Not on file) Date of Consult: 05/14/2020  Primary Physician: Patient, No Pcp Per Primary Cardiologist: *new     Michele Maldonado is a 34 y.o. female who is being seen today for the evaluation of atrial fib at the request of ER.    HPI Michele Maldonado is a 34 y.o. female seen for atrial fib   Presented to the ER 05/09/2020 with palpitations and shortness of breath and found to be in atrial fibrillation with controlled ventricular response. Beta hCG was elevated.  Ultrasound confirmed unknown pregnancy with twins.  Pregnancy with her first child at 32-year-old son was complicated by "blood thinners and seizure medications "also found to have nephrotic syndrome and underwent C-section at 34 weeks.  Her second child was delivered early at 31-1/2 weeks because of fetal bradycardia during umbilical cord sampling.  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 Kell isoimmunization during pregnancy    a. per prior notes - h/o anti Kell alloimmunization.  . MVP (mitral valve prolapse)   . Nephrotic syndrome    per prior OB notes  . Pregnant   . Preterm labor    31w  . Thrombocytopenia ([facility])   . Ventricular septal defect       Surgical History:  Past Surgical History:  Procedure Laterality Date  . CESAREAN SECTION N/A 12/29/2013   Procedure: CESAREAN SECTION;  Surgeon: Allie Bossier, MD;  Location: [facility];  Service: Obstetrics;  Laterality: N/A;    . REPAIR VAGINAL CUFF N/A 12/30/2013   Procedure: REPAIR VAGINAL CUFF;  Surgeon: Allie Bossier, MD;  Location: [facility];  Service: Gynecology;  Laterality: N/A;  Insertion of Bakri Balloon     Home Meds: Current Meds  Medication Sig  . Prenatal Vit-Fe Fumarate-FA (MULTIVITAMIN-PRENATAL) 27-0.8 MG TABS tablet Take 1 tablet by mouth daily at 12 noon.    Allergies: No Known Allergies  Social History   Socioeconomic History  . Marital status: Single    Spouse name: Not on file  . Number of children: Not on file  . Years of education: Not on file  . Highest education level: Not on file  Occupational History  . Not on file  Tobacco Use  . Smoking status: Never Smoker  . Smokeless tobacco: Never Used  Vaping Use  . Vaping Use: Never used  Substance and Sexual Activity  . Alcohol use: No  . Drug use: No    Types: Marijuana    Comment: former  . Sexual activity: Yes    Birth control/protection: None  Other Topics Concern  . Not on file  Social History Narrative  . Not on file   Social Determinants of Health   Financial Resource Strain:   . Difficulty of Paying Living Expenses:   Food Insecurity:   . Worried About Running  Out of Food in the Last Year:   . Ran Out of Food in the Last Year:   Transportation Needs:   . Lack of Transportation (Medical):   Marland Kitchen Lack of Transportation (Non-Medical):   Physical Activity:   . Days of Exercise per Week:   . Minutes of Exercise per Session:   Stress:   . Feeling of Stress :   Social Connections:   . Frequency of Communication with Friends and Family:   . Frequency of Social Gatherings with Friends and Family:   . Attends Religious Services:   . Active Member of Clubs or Organizations:   . Attends Banker Meetings:   Marland Kitchen Marital Status:   Intimate Partner Violence:   . Fear of Current or Ex-Partner:   . Emotionally Abused:   Marland Kitchen Physically Abused:   . Sexually Abused:      Family History  Problem Relation Age of  Onset  . Hypertension Maternal Grandmother   . Diabetes Maternal Grandmother   . Thyroid disease Mother   . Atrial fibrillation Mother      ROS:  Please see the history of present illness.     All other systems reviewed and negative.    Physical Exam: Blood pressure (!) 121/55, pulse 75, height 5\' 9"  (1.753 m), weight 156 lb (70.8 kg), last menstrual period 04/16/2020, SpO2 98 %, unknown if currently breastfeeding. General: Well developed, well nourished female in no acute distress. Head: Normocephalic, atraumatic, sclera non-icteric, no xanthomas, nares are without discharge. EENT: normal  Lymph Nodes:  none Neck: Negative for carotid bruits. JVD not elevated. Back:without scoliosis kyphosis Lungs: Clear bilaterally to auscultation without wheezes, rales, or rhonchi. Breathing is unlabored. Heart: RRR with S1 S2. No   murmur . No rubs, or gallops appreciated. Abdomen: Soft, non-tender, non-distended with normoactive bowel sounds. No hepatomegaly. No rebound/guarding. No obvious abdominal masses. Msk:  Strength and tone appear normal for age. Extremities: No clubbing or cyanosis. No  edema.  Distal pedal pulses are 2+ and equal bilaterally. Skin: Warm and Dry Neuro: Alert and oriented X 3. CN III-XII intact Grossly normal sensory and motor function . Psych:  Responds to questions appropriately with a normal affect.      Labs: Cardiac Enzymes No results for input(s): CKTOTAL, CKMB, TROPONINI in the last 72 hours. CBC Lab Results  Component Value Date   WBC 8.8 05/09/2020   HGB 13.2 05/09/2020   HCT 39.3 05/09/2020   MCV 91.0 05/09/2020   PLT 152 05/09/2020   PROTIME: No results for input(s): LABPROT, INR in the last 72 hours. Chemistry  Recent Labs  Lab 05/09/20 1015  NA 134*  K 3.9  CL 104  CO2 20*  BUN 7  CREATININE 0.55  CALCIUM 8.6*  GLUCOSE 83   Lipids No results found for: CHOL, HDL, LDLCALC, TRIG BNP No results found for: PROBNP Thyroid Function  Tests: No results for input(s): TSH, T4TOTAL, T3FREE, THYROIDAB in the last 72 hours.  Invalid input(s): FREET3 Miscellaneous Lab Results  Component Value Date   DDIMER 3.76 (H) 12/30/2013    Radiology/Studies:  DG Chest 2 View  Result Date: 05/09/2020 CLINICAL DATA:  Shortness of breath that started last night EXAM: CHEST - 2 VIEW COMPARISON:  January 31, 2020 FINDINGS: Cardiomediastinal contours and hilar structures are normal. Lungs are clear.  No sign of effusion. On limited assessment skeletal structures without acute bone finding. Mild dextroconvex curvature of the thoracic spine. IMPRESSION: No acute cardiopulmonary disease. Electronically Signed  By: Donzetta Kohut M.D.   On: 05/09/2020 10:33   US OB Comp AddL Gest Less 14 Wks  Result Date: 05/09/2020 CLINICAL DATA:  Positive pregnancy test EXAM: TWIN OBSTETRIC <14WK [place] AND TRANSVAGINAL OB US COMPARISON:  None. FINDINGS: Number of IUPs:  2 Chorionicity/Amnionicity:  Dichorionic-diamniotic (thick membrane) TWIN 1 Yolk sac:  Visualized. Embryo:  Visualized. Cardiac Activity: Visualized. Heart Rate: 143 bpm CRL: 3.8 mm   6 w 0 d                  [place] EDC: 01/02/2021 TWIN 2 Yolk sac:  Visualized. Embryo:  Visualized. Cardiac Activity: Visualized. Heart Rate: 140 bpm CRL: 3.7 mm   6 w 0 d                  [place] EDC: 01/02/2021 Subchorionic hemorrhage:  None visualized. Maternal uterus/adnexae: Unremarkable. Right ovary only seen transabdominally due to location. Trace free fluid. IMPRESSION: Two live (dichorionic-diamniotic) intrauterine pregnancies as described. Electronically Signed   By: Guadlupe Spanish M.D.   On: 05/09/2020 15:32   US OB LESS THAN 14 WEEKS WITH OB TRANSVAGINAL  Result Date: 05/09/2020 CLINICAL DATA:  Positive pregnancy test EXAM: TWIN OBSTETRIC <14WK [place] AND TRANSVAGINAL OB US COMPARISON:  None. FINDINGS: Number of IUPs:  2 Chorionicity/Amnionicity:  Dichorionic-diamniotic (thick membrane) TWIN 1 Yolk sac:  Visualized. Embryo:   Visualized. Cardiac Activity: Visualized. Heart Rate: 143 bpm CRL: 3.8 mm   6 w 0 d                  [place] EDC: 01/02/2021 TWIN 2 Yolk sac:  Visualized. Embryo:  Visualized. Cardiac Activity: Visualized. Heart Rate: 140 bpm CRL: 3.7 mm   6 w 0 d                  [place] EDC: 01/02/2021 Subchorionic hemorrhage:  None visualized. Maternal uterus/adnexae: Unremarkable. Right ovary only seen transabdominally due to location. Trace free fluid. IMPRESSION: Two live (dichorionic-diamniotic) intrauterine pregnancies as described. Electronically Signed   By: Guadlupe Spanish M.D.   On: 05/09/2020 15:32    EKG:  05/09/2020 09 56 atrial fibrillation 05/09/2020 1228 atrial fibrillation   Assessment and Plan:  Paroxysmal atrial fibrillation  Pregnancy  VSD-spontaneously closure  Kell Ab autosensitization   Patient has paroxysmal atrial fibrillation.  No prior history of palpitations to suggest an underlying concealed bypass tract that might have served as a trigger.  No ddimer was checked, and up to date identifies a near doubling of the normal level, so its specificiy may be decreased but in the absence of an explanation of her atrial fibrillation I think it is worth a try using a high level to augment the specificity  Otherwise, we will follow her at this juncture with no therapies.    She is quite aware of her atrial fibrillation so in the event that it recurs more specific therapies can be pursued  Addendum  Her d-dimer was elevated and we arranged for CT scanning but she was presenting to ER  Prior to this I had reached out to OBGYN regarding the timing of a visit given the Kell autosensitization-- they were going to reach out to the patient    Sherryl Manges

## 2020-05-14 NOTE — Patient Instructions (Addendum)
Medication Instructions:  Your physician recommends that you continue on your current medications as directed. Please refer to the Current Medication list given to you today. *If you need a refill on your cardiac medications before your next appointment, please call your pharmacy*   Lab Work: D-Dimer today  If you have labs (blood work) drawn today and your tests are completely normal, you will receive your results only by: Marland Kitchen MyChart Message (if you have MyChart) OR . A paper copy in the mail If you have any lab test that is abnormal or we need to change your treatment, we will call you to review the results.   Testing/Procedures: Your physician has requested that you have an echocardiogram. Echocardiography is a painless test that uses sound waves to create images of your heart. It provides your doctor with information about the size and shape of your heart and how well your heart's chambers and valves are working. This procedure takes approximately one hour. There are no restrictions for this procedure.  Your echo is scheduled for 05/29/2020      Follow-Up: At [facility], you and your health needs are our priority.  As part of our continuing mission to provide you with exceptional heart care, we have created designated Provider Care Teams.  These Care Teams include your primary Cardiologist (physician) and Advanced Practice Providers (APPs -  Physician Assistants and Nurse Practitioners) who all work together to provide you with the care you need, when you need it.  We recommend signing up for the patient portal called "MyChart".  Sign up information is provided on this After Visit Summary.  MyChart is used to connect with patients for Virtual Visits (Telemedicine).  Patients are able to view lab/test results, encounter notes, upcoming appointments, etc.  Non-urgent messages can be sent to your provider as well.   To learn more about what you can do with MyChart, go to  [email or web address].    Your next appointment:   6 month(s)  The format for your next appointment:   In Person  Provider:   Sherryl Manges, MD

## 2020-05-15 ENCOUNTER — Telehealth: Payer: Self-pay

## 2020-05-15 ENCOUNTER — Ambulatory Visit: Payer: Self-pay | Admitting: *Deleted

## 2020-05-15 ENCOUNTER — Inpatient Hospital Stay (HOSPITAL_COMMUNITY)
Admission: AD | Admit: 2020-05-15 | Discharge: 2020-05-15 | Disposition: A | Discharge status: Home / Self Care | Payer: Medicaid Other | Attending: Obstetrics and Gynecology | Admitting: Obstetrics and Gynecology

## 2020-05-15 ENCOUNTER — Encounter (HOSPITAL_COMMUNITY): Payer: Self-pay | Admitting: Obstetrics and Gynecology

## 2020-05-15 DIAGNOSIS — N939 Abnormal uterine and vaginal bleeding, unspecified: Secondary | ICD-10-CM | POA: Insufficient documentation

## 2020-05-15 DIAGNOSIS — O209 Hemorrhage in early pregnancy, unspecified: Secondary | ICD-10-CM

## 2020-05-15 DIAGNOSIS — N898 Other specified noninflammatory disorders of vagina: Secondary | ICD-10-CM | POA: Insufficient documentation

## 2020-05-15 DIAGNOSIS — I341 Nonrheumatic mitral (valve) prolapse: Secondary | ICD-10-CM | POA: Diagnosis not present

## 2020-05-15 DIAGNOSIS — R7989 Other specified abnormal findings of blood chemistry: Secondary | ICD-10-CM

## 2020-05-15 DIAGNOSIS — O99891 Other specified diseases and conditions complicating pregnancy: Secondary | ICD-10-CM

## 2020-05-15 DIAGNOSIS — I4891 Unspecified atrial fibrillation: Secondary | ICD-10-CM

## 2020-05-15 DIAGNOSIS — R0602 Shortness of breath: Secondary | ICD-10-CM

## 2020-05-15 DIAGNOSIS — Z8249 Family history of ischemic heart disease and other diseases of the circulatory system: Secondary | ICD-10-CM | POA: Insufficient documentation

## 2020-05-15 DIAGNOSIS — Z3A01 Less than 8 weeks gestation of pregnancy: Secondary | ICD-10-CM

## 2020-05-15 LAB — CBC
HCT: 33.4 % — ABNORMAL LOW (ref 36.0–46.0)
Hemoglobin: 11.3 g/dL — ABNORMAL LOW (ref 12.0–15.0)
MCH: 30.1 pg (ref 26.0–34.0)
MCHC: 33.8 g/dL (ref 30.0–36.0)
MCV: 89.1 fL (ref 80.0–100.0)
Platelets: 147 10*3/uL — ABNORMAL LOW (ref 150–400)
RBC: 3.75 MIL/uL — ABNORMAL LOW (ref 3.87–5.11)
RDW: 13 % (ref 11.5–15.5)
WBC: 9 10*3/uL (ref 4.0–10.5)
nRBC: 0 % (ref 0.0–0.2)

## 2020-05-15 LAB — URINALYSIS, ROUTINE W REFLEX MICROSCOPIC
Bilirubin Urine: NEGATIVE
Glucose, UA: NEGATIVE mg/dL
Hgb urine dipstick: NEGATIVE
Ketones, ur: 5 mg/dL — AB
Leukocytes,Ua: NEGATIVE
Nitrite: NEGATIVE
Protein, ur: NEGATIVE mg/dL
Specific Gravity, Urine: 1.027 (ref 1.005–1.030)
pH: 5 (ref 5.0–8.0)

## 2020-05-15 LAB — WET PREP, GENITAL
Clue Cells Wet Prep HPF POC: NONE SEEN
Sperm: NONE SEEN
Trich, Wet Prep: NONE SEEN
Yeast Wet Prep HPF POC: NONE SEEN

## 2020-05-15 NOTE — Telephone Encounter (Signed)
Did not speak with pt.  She hung up before anyone could get on the line.

## 2020-05-15 NOTE — Telephone Encounter (Addendum)
Received a call from the Dr. Sherryl Manges from [facility] requesting that a provider give him a call directly at [phone] in regards to her appt scheduled in August.  Message routed to Dr. Shawnie Pons.  Per Shawnie Pons recommendation pt can keep her 06/12/20 appt scheduled as she will be at proper gestation.  Provider recommends reaching out to pt to make sure that she knows about her appt.  Attempted to contact pt and was unable to L/M due to no voicemail box not set up.

## 2020-05-15 NOTE — MAU Provider Note (Addendum)
Patient Michele Maldonado is a 34 y.o.  [phone]  at [redacted]w[redacted]d here with complaints of pink mucous discharge today when she wiped.  She reports a small pain on her left side as well. She denies contractions, vaginal bleeding, dysuria, NV. She was recently diagnosed with twin pregnancy last week. Last intercourse was 3 days ago.   Her medical history is significant for Afib (diagosed in ED on 6-26). She had a cardiology work-up yesterday, and was told today that she needed to be ruled out for a PE and that they would schedule her outpatient. She reports that her feelings of Afib (namely, feeling of anxious and palpatations) are not worse today and have no changed since her cardiology appointment yesterday. She thinks she is anxious about her twin pregnancy and that is why she came in to be seen today.  See phone notes from 05/15/2020 from cardiology.   She has a history of c/section for Kells isoimmunization, c-section, history of nephrotic syndrome, VSD, MVP, and loss of baby in Mar 20, 2017 due to SIDS.   History     CSN: [id number]  Arrival date and time: 05/15/20 1820   First Provider Initiated Contact with Patient 05/15/20 1925      Chief Complaint  Patient presents with  . Vaginal Bleeding  . Abdominal Pain  . Shortness of Breath   Vaginal Bleeding The patient's primary symptoms include vaginal discharge. The patient's pertinent negatives include no vaginal bleeding. This is a new problem. The current episode started today. The problem occurs rarely. The problem has been resolved. Pertinent negatives include no back pain, chills, constipation, diarrhea, dysuria, urgency or vomiting. Vaginal discharge characteristics: pink. Vaginal bleeding amount: "pink tinged mucous" She has not been passing clots. She has not been passing tissue. Nothing aggravates the symptoms.    OB History     Gravida  7   Para  5   Term  4   Preterm  1   AB  0   Living  4      SAB  0   TAB  0   Ectopic  0    Multiple  0   Live Births  5        Obstetric Comments  Child born in 03[birth year]20[birth year]2017 deceased --- died at 64 months old, taken to ED unresponsive -- pt not ready to talk          Past Medical History:  Diagnosis Date  . Kell isoimmunization during pregnancy    a. per prior notes - h/o anti Kell alloimmunization.  . MVP (mitral valve prolapse)   . Nephrotic syndrome    per prior OB notes  . Pregnant   . Preterm labor    31w  . Thrombocytopenia (HCC)   . Ventricular septal defect     Past Surgical History:  Procedure Laterality Date  . CESAREAN SECTION N/A 12/29/2013   Procedure: CESAREAN SECTION;  Surgeon: Allie Bossier, MD;  Location: [facility];  Service: Obstetrics;  Laterality: N/A;  . REPAIR VAGINAL CUFF N/A 12/30/2013   Procedure: REPAIR VAGINAL CUFF;  Surgeon: Allie Bossier, MD;  Location: [facility];  Service: Gynecology;  Laterality: N/A;  Insertion of Bakri Balloon    Family History  Problem Relation Age of Onset  . Hypertension Maternal Grandmother   . Diabetes Maternal Grandmother   . Thyroid disease Mother   . Atrial fibrillation Mother     Social History   Tobacco Use  . Smoking status: Never Smoker  .  Smokeless tobacco: Never Used  Vaping Use  . Vaping Use: Never used  Substance Use Topics  . Alcohol use: No  . Drug use: No    Types: Marijuana    Comment: former    Allergies: No Known Allergies  Medications Prior to Admission  Medication Sig Dispense Refill Last Dose  . Prenatal Vit-Fe Fumarate-FA (MULTIVITAMIN-PRENATAL) 27-0.8 MG TABS tablet Take 1 tablet by mouth daily at 12 noon. 30 tablet 2 05/15/2020 at Unknown time    Review of Systems  Constitutional: Negative for chills.  Gastrointestinal: Negative for constipation, diarrhea and vomiting.  Genitourinary: Positive for vaginal bleeding and vaginal discharge. Negative for dysuria and urgency.  Musculoskeletal: Negative for back pain.   Physical Exam   Blood pressure 112/61, pulse 72, temperature 98.4 F  (36.9 C), temperature source Oral, resp. rate 16, height 5\' 9"  (1.753 m), weight 70.7 kg, last menstrual period 03/24/2020, SpO2 100 %, unknown if currently breastfeeding.  Physical Exam HENT:     Head: Normocephalic.  Cardiovascular:     Rate and Rhythm: Normal rate.     Heart sounds: No murmur heard.  No gallop.   Pulmonary:     Effort: Pulmonary effort is normal.     Breath sounds: Normal breath sounds.  Abdominal:     Palpations: Abdomen is soft.  Genitourinary:    Comments: NEFG; no blood in the vagina, white discharge, cervix is long, closed, thick.  Neurological:     Mental Status: She is alert.     MAU Course  Procedures  MDM -wet prep: no signs of infection, GC CT pending -Patient reports significant stress and anxiety over death of child Mar 10, 2017 and recent diagnosis of twin pregnancy.  -EKG shows normal sinus rhythm.   -vital signs stable; she is able to recline, talk, laugh without distress and O2 sat is 95 or greater. She does not appear in any respiratory distress, no tripod breathing or flaring. Resp are 16/min.  Patient Vitals for the past 24 hrs:  BP Temp Temp src Pulse Resp SpO2 Height Weight  05/15/20 03-10-38 107/64 98.1 F (36.7 C) Oral 64 -- -- -- --  05/15/20 1840 112/61 98.4 F (36.9 C) Oral 72 16 100 % 5\' 9"  (1.753 m) 70.7 kg  05/15/20 1837 -- -- -- -- -- 100 % -- --    Patient reports that her symtpoms of Afib are not worse today and that she thinks it's anxiety. Patient and provider engaged in 20 min therapeutic listening session and patient feels better and more calm. She declines transfer to the ED but rather will follow up with cardiology outpatient.  -Patient not candidate for Rhogam (B positive blood type) Assessment and Plan   1. Vaginal discharge   2. Vaginal bleeding    2. Patient stable for discharge; no bleeding on exam and not done as patient does not have urgent or emergent complaint. GC CT pending.  3. Patient will call cardiology  tomorrow and report on her visit to MAU, including the fact that she still needs CTA.  4. Strict return precautions given, including worsening palpations and need to seek care at [facility] for any cardiac concerns vs. MAU for ob concerns.    [place] Carnetta Losada 05/15/2020, 7:38 PM

## 2020-05-15 NOTE — MAU Note (Signed)
When she wipes herself, saw some pink mucous. Only saw it once. 'little cramps' on the inside, started about 2 hrs ago. Having shortness of breath, off and on

## 2020-05-15 NOTE — Telephone Encounter (Signed)
Spoke with pt and advised per Dr Graciela Husbands pt with slightly elevated D-Dimer lab results.  Pt will need CTA to rule out pulmonary embolism.  Pt advised order has been placed and she should be contacted this evening or tomorrow to schedule appointment.  Pt states she is going to ED now d/t some spotting she has had today.  Pt advised to notify ED staff about her episode of AFIB, elevated D-Dimer and need for CTA to rule out PE.  Pt denies current CP or SOB and verbalizes understanding, agrees with current plan.

## 2020-05-15 NOTE — Telephone Encounter (Signed)
Called pt and confirmed that pt is aware of her NEW OB appt scheduled on 06/12/20.  Pt stated "yes".  Pt states that she went to the bathroom and wiped in the tissue was mucous that slight pink tinge to it.  I verified with pt that this is a one time episode.  Pt stated that it only happened one time.  I advised pt that she could see bloody show when wipes when she has sex or anything irritates the cervix because it so sensitive.  I advised pt that if she is bleeding like a period or has increase pain to go to MAU.  Pt verbalized understanding.   Addison Naegeli, RN

## 2020-05-16 ENCOUNTER — Other Ambulatory Visit: Payer: Medicaid Other

## 2020-05-16 LAB — GC/CHLAMYDIA PROBE AMP (~~LOC~~) NOT AT ARMC
Chlamydia: NEGATIVE
Comment: NEGATIVE
Comment: NORMAL
Neisseria Gonorrhea: NEGATIVE

## 2020-05-20 ENCOUNTER — Telehealth (HOSPITAL_COMMUNITY): Payer: Self-pay | Admitting: Physician Assistant

## 2020-05-20 NOTE — Telephone Encounter (Signed)
Please arrange the CT ( again ) thx

## 2020-05-20 NOTE — Telephone Encounter (Signed)
Thank you, will route to Lasandra Beech to find out if there anything we can do from the patient care fund? This is 34y/o F recently seen in ED for new onset atrial fib, also found to be pregnant with twins.   Also: In reviewing chart I also see that Dr. Graciela Husbands ordered a CTA for elevated d-dimer last week but CTA was not yet done. Patient was seen in MAU for spotting but discharged home, did not have study at that time. Will route to Dr. Graciela Husbands and his nurse for further direction with high priority on message.  Miyu Fenderson PA-C

## 2020-05-20 NOTE — Telephone Encounter (Signed)
Just an FYI.  Patient cancelled echocardiogram on 07/8 and 7/9 and 05/29/2020 and states her insurance does not cover it.  The order will be removed from the WQ.   Thank you

## 2020-05-22 ENCOUNTER — Other Ambulatory Visit (HOSPITAL_COMMUNITY): Payer: Medicaid Other

## 2020-05-23 ENCOUNTER — Other Ambulatory Visit (HOSPITAL_COMMUNITY): Payer: Medicaid Other

## 2020-05-27 NOTE — Telephone Encounter (Signed)
M  could I beg of you to call her and check on her and at least make sure she has followup with me/APP in the next 6-8 weeks plz Thanks SK

## 2020-05-27 NOTE — Telephone Encounter (Signed)
noted 

## 2020-05-28 NOTE — Telephone Encounter (Signed)
Attempted phone call and left voicemail message to call RN at [phone].

## 2020-05-29 ENCOUNTER — Other Ambulatory Visit (HOSPITAL_COMMUNITY): Payer: Medicaid Other

## 2020-06-02 ENCOUNTER — Other Ambulatory Visit: Payer: Self-pay

## 2020-06-02 ENCOUNTER — Ambulatory Visit (INDEPENDENT_AMBULATORY_CARE_PROVIDER_SITE_OTHER): Payer: Medicaid Other | Admitting: *Deleted

## 2020-06-02 VITALS — BP 113/60 | HR 85 | Ht 68.0 in | Wt 159.7 lb

## 2020-06-02 DIAGNOSIS — O26899 Other specified pregnancy related conditions, unspecified trimester: Secondary | ICD-10-CM

## 2020-06-02 DIAGNOSIS — R109 Unspecified abdominal pain: Secondary | ICD-10-CM | POA: Diagnosis not present

## 2020-06-02 DIAGNOSIS — R8290 Unspecified abnormal findings in urine: Secondary | ICD-10-CM

## 2020-06-02 NOTE — Progress Notes (Addendum)
Pt reports having menstrual-like abdominal cramping and odor in urine x3 days. CCUA obtained and was not indicative of infection. Culture will be sent. Pt was advised she will be notified of results and treatment if needed via MyChart. She was instructed to go to MAU if she develops heavy vaginal bleeding or increased sharp abdominal pain. Pt voiced understanding of all information and instructions given.   Chart reviewed for nurse visit. Agree with plan of care.   Currie Paris, NP 06/02/2020 11:42 AM

## 2020-06-03 LAB — POCT URINALYSIS DIP (DEVICE)
Bilirubin Urine: NEGATIVE
Glucose, UA: NEGATIVE mg/dL
Hgb urine dipstick: NEGATIVE
Ketones, ur: NEGATIVE mg/dL
Leukocytes,Ua: NEGATIVE
Nitrite: NEGATIVE
Protein, ur: NEGATIVE mg/dL
Specific Gravity, Urine: >1.03 — ABNORMAL HIGH (ref 1.005–1.030)
Urobilinogen, UA: 0.2 mg/dL (ref 0.0–1.0)
pH: 7.5 (ref 5.0–8.0)

## 2020-06-04 LAB — URINE CULTURE, OB REFLEX

## 2020-06-04 LAB — CULTURE, OB URINE

## 2020-06-12 ENCOUNTER — Telehealth: Payer: Self-pay

## 2020-06-12 ENCOUNTER — Other Ambulatory Visit: Payer: Self-pay

## 2020-06-12 ENCOUNTER — Ambulatory Visit (INDEPENDENT_AMBULATORY_CARE_PROVIDER_SITE_OTHER): Payer: Medicaid Other | Admitting: Obstetrics & Gynecology

## 2020-06-12 ENCOUNTER — Encounter: Payer: Self-pay | Admitting: Obstetrics & Gynecology

## 2020-06-12 ENCOUNTER — Other Ambulatory Visit (HOSPITAL_COMMUNITY)
Admission: RE | Admit: 2020-06-12 | Discharge: 2020-06-12 | Disposition: A | Discharge status: Home / Self Care | Payer: Medicaid Other | Source: Ambulatory Visit | Attending: Obstetrics & Gynecology | Admitting: Obstetrics & Gynecology

## 2020-06-12 VITALS — BP 134/66 | HR 66 | Wt 161.9 lb

## 2020-06-12 DIAGNOSIS — O0990 Supervision of high risk pregnancy, unspecified, unspecified trimester: Secondary | ICD-10-CM | POA: Diagnosis not present

## 2020-06-12 DIAGNOSIS — O361990 Maternal care for other isoimmunization, unspecified trimester, not applicable or unspecified: Secondary | ICD-10-CM

## 2020-06-12 DIAGNOSIS — Z3A11 11 weeks gestation of pregnancy: Secondary | ICD-10-CM | POA: Insufficient documentation

## 2020-06-12 DIAGNOSIS — Z98891 History of uterine scar from previous surgery: Secondary | ICD-10-CM

## 2020-06-12 DIAGNOSIS — O30009 Twin pregnancy, unspecified number of placenta and unspecified number of amniotic sacs, unspecified trimester: Secondary | ICD-10-CM | POA: Insufficient documentation

## 2020-06-12 DIAGNOSIS — O0991 Supervision of high risk pregnancy, unspecified, first trimester: Secondary | ICD-10-CM

## 2020-06-12 DIAGNOSIS — O30041 Twin pregnancy, dichorionic/diamniotic, first trimester: Secondary | ICD-10-CM | POA: Diagnosis not present

## 2020-06-12 DIAGNOSIS — F1210 Cannabis abuse, uncomplicated: Secondary | ICD-10-CM

## 2020-06-12 DIAGNOSIS — Z8751 Personal history of pre-term labor: Secondary | ICD-10-CM

## 2020-06-12 DIAGNOSIS — O30049 Twin pregnancy, dichorionic/diamniotic, unspecified trimester: Secondary | ICD-10-CM

## 2020-06-12 LAB — POCT URINALYSIS DIP (DEVICE)
Bilirubin Urine: NEGATIVE
Glucose, UA: NEGATIVE mg/dL
Hgb urine dipstick: NEGATIVE
Ketones, ur: NEGATIVE mg/dL
Leukocytes,Ua: NEGATIVE
Nitrite: NEGATIVE
Protein, ur: NEGATIVE mg/dL
Specific Gravity, Urine: >=1.03 (ref 1.005–1.030)
Urobilinogen, UA: 0.2 mg/dL (ref 0.0–1.0)
pH: 7 (ref 5.0–8.0)

## 2020-06-12 MED ORDER — BLOOD PRESSURE KIT DEVI: 1.0000 | 0 refills | Status: DC | PRN

## 2020-06-12 NOTE — Progress Notes (Signed)
Subjective:    Michele Maldonado is a [id number] [redacted]w[redacted]d being seen today for her first obstetrical visit.  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 Patient does intend to breast feed. Pregnancy history fully reviewed.  Pregnancy not planned and excited.  Considering BTL.  Patient reports lethargy.  Vitals:   06/12/20 0911  BP: (!) 134/66  Pulse: 66  Weight: 161 lb 14.4 oz (73.4 kg)    HISTORY: OB History  Gravida Para Term Preterm AB Living  7 6 4 2  0 5  SAB TAB Ectopic Multiple Live Births  0 0 0 0 6    # Outcome Date GA Lbr Len/2nd Weight Sex Delivery Anes PTL Lv  7 Current           6 Preterm 02/10/18 [redacted]w[redacted]d  4 lb 13 oz (2.183 kg) F CS-Unspec Spinal  LIV     Birth Comments28w2d Gene   5 Term 01/20/17 [redacted]w[redacted]d 11:59 / 00:06 9 lb 11 oz (4.394 kg) F VBAC EPI  DEC  4 Preterm 12/29/13 [redacted]w[redacted]d  4 lb 5 oz (1.956 kg) M CS-LTranv Spinal  LIV  3 Term 04/07/10 [redacted]w[redacted]d  9 lb (4.082 kg) M Vag-Spont EPI  LIV  2 Term 12/20/08 [redacted]w[redacted]d  8 lb 4 oz (3.742 kg) M Vag-Spont EPI  LIV  1 Term 06/07/06 [redacted]w[redacted]d  7 lb 14 oz (3.572 kg) F Vag-Spont EPI  LIV    Obstetric Comments  Child born in [birth year]/26[birth year]2017 deceased --- died at 61 months old, taken to ED unresponsive -- pt not ready to talk    Past Medical History:  Diagnosis Date  . Anemia   . Atrial fibrillation (HCC)   . Kell isoimmunization during pregnancy    a. per prior notes - h/o anti Kell alloimmunization.  . MVP (mitral valve prolapse)   . Thrombocytopenia (HCC)   . Ventricular septal defect    Past Surgical History:  Procedure Laterality Date  . CESAREAN SECTION N/A 12/29/2013   Procedure: CESAREAN SECTION;  Surgeon: 12/31/2013, MD;  Location: [facility];  Service: Obstetrics;  Laterality: N/A;  . REPAIR VAGINAL CUFF N/A 12/30/2013   Procedure: REPAIR VAGINAL CUFF;  Surgeon: 01/01/2014, MD;  Location: [facility];  Service: Gynecology;  Laterality: N/A;  Insertion of Bakri Balloon    Family History  Problem Relation Age of Onset  . Hypertension Maternal Grandmother   . Diabetes Maternal Grandmother   . Thyroid disease Mother   . Atrial fibrillation Maternal Grandfather      Exam    Uterus:     Pelvic Exam:    Perineum: No Hemorrhoids   Vulva: normal   Vagina:  normal mucosa   pH: n/a   Cervix: no cervical motion tenderness   Adnexa: normal adnexa   Bony Pelvis: average  System: Breast:  normal appearance, no masses or tenderness   Skin: normal coloration and turgor, no rashes    Neurologic: oriented, normal mood   Extremities: normal strength, tone, and muscle mass   HEENT sclera clear, anicteric, oropharynx clear, no lesions, neck supple with midline trachea and trachea midline   Mouth/Teeth mucous membranes moist, pharynx normal without lesions   Neck supple and no masses   Cardiovascular: regular rate and rhythm   Respiratory:  appears well, vitals normal, no respiratory distress, acyanotic, normal RR, neck free of mass or lymphadenopathy, chest clear, no wheezing, crepitations, rhonchi, normal symmetric air  entry   Abdomen: soft, non-tender; bowel sounds normal; no masses,  no organomegaly   Urinary: urethral meatus normal      Assessment:    Pregnancy: [id number] Patient Active Problem List   Diagnosis Date Noted  . Supervision of high risk pregnancy, antepartum 06/12/2020  . Twin pregnancy 06/12/2020  . A-fib (HCC) 05/14/2020  . Kell isoimmunization in pregnancy 01-16-18  . Death of child 2018-01-16  . Mitral valve prolapse 01/10/2018  . History of nephrotic syndrome 10/26/2016  . Marijuana abuse 07/24/2016  . Hx of thrombocytopenia 12/27/2013        Plan:     Initial labs drawn. Prenatal vitamins. Problem list reviewed and updated. Genetic Screening discussed NIPS and AFP   1.  Hx Kell isoimmunization.  FOB Kk.  MFM consult and [place]. 2.  Thrombocytopenia--slightly low today; will monitor 3.  A fib / MVP--needs follow up with  Cards.  Pulse RRR.  Sent note to Dr. Graciela Husbands.  Pt had opted no to go until she received her Medicaid card (worried about bills). 4.  Twin pregnancy--MFM [place] (di/di) 5.  Two preterm deliveries--neither due to PTL.  One is from the marked swelling from compress of IVC.  The other ws post amnio and fetal bradycardia  6.  Hx of c/s x2   Elsie Lincoln 06/12/2020

## 2020-06-12 NOTE — Progress Notes (Addendum)
NEW OB packet given  Home Medicaid Form completed  

## 2020-06-12 NOTE — Progress Notes (Signed)
Informal bedside US for FHR per M-mode - Twin A - 164, Twin B - 153.  Both babies observed with FM.

## 2020-06-12 NOTE — Telephone Encounter (Signed)
Per Dr Odessa Fleming request attempted phone call to remind pt abut scheduled Echo.  Per chart ok to leave detailed message.  Message left and reminded pt echo scheduled for 06/16/2020 at 105pm.  Please call office at [phone] for any further questions.

## 2020-06-12 NOTE — Patient Instructions (Signed)
[facility]  [address] [place], [place] Please go pick up BP cuff

## 2020-06-12 NOTE — Progress Notes (Signed)
Per Dr Graciela Husbands order requested for Echo; however, Echo has been ordered and scheduled for June 16, 2020 by Ronie Spies PA-C.  Dr Graciela Husbands notified by in-basket message.

## 2020-06-13 LAB — CBC/D/PLT+RPR+RH+ABO+RUB AB...
Antibody Screen: NEGATIVE
Basophils Absolute: 0.1 10*3/uL (ref 0.0–0.2)
Basos: 1 %
EOS (ABSOLUTE): 0.1 10*3/uL (ref 0.0–0.4)
Eos: 1 %
HCV Ab: <0.1 s/co ratio (ref 0.0–0.9)
HIV Screen 4th Generation wRfx: NONREACTIVE
Hematocrit: 36.2 % (ref 34.0–46.6)
Hemoglobin: 12.5 g/dL (ref 11.1–15.9)
Hepatitis B Surface Ag: NEGATIVE
Immature Grans (Abs): 0 10*3/uL (ref 0.0–0.1)
Immature Granulocytes: 0 %
Lymphocytes Absolute: 2.4 10*3/uL (ref 0.7–3.1)
Lymphs: 27 %
MCH: 30.9 pg (ref 26.6–33.0)
MCHC: 34.5 g/dL (ref 31.5–35.7)
MCV: 90 fL (ref 79–97)
Monocytes Absolute: 0.6 10*3/uL (ref 0.1–0.9)
Monocytes: 7 %
Neutrophils Absolute: 5.8 10*3/uL (ref 1.4–7.0)
Neutrophils: 64 %
Platelets: 204 10*3/uL (ref 150–450)
RBC: 4.04 x10E6/uL (ref 3.77–5.28)
RDW: 13.4 % (ref 11.7–15.4)
RPR Ser Ql: NONREACTIVE
Rh Factor: POSITIVE
Rubella Antibodies, IGG: 3.77 index (ref >0.99)
WBC: 9 10*3/uL (ref 3.4–10.8)

## 2020-06-13 LAB — HCV INTERPRETATION

## 2020-06-13 LAB — CYTOLOGY - PAP
Chlamydia: NEGATIVE
Comment: NEGATIVE
Comment: NEGATIVE
Comment: NORMAL
Diagnosis: NEGATIVE
High risk HPV: NEGATIVE
Neisseria Gonorrhea: NEGATIVE

## 2020-06-13 LAB — FERRITIN: Ferritin: 69 ng/mL (ref 15–150)

## 2020-06-15 DIAGNOSIS — Z419 Encounter for procedure for purposes other than remedying health state, unspecified: Secondary | ICD-10-CM | POA: Diagnosis not present

## 2020-06-16 ENCOUNTER — Other Ambulatory Visit: Payer: Self-pay

## 2020-06-16 ENCOUNTER — Ambulatory Visit (HOSPITAL_COMMUNITY): Payer: Medicaid Other | Attending: Cardiology

## 2020-06-16 DIAGNOSIS — I4891 Unspecified atrial fibrillation: Secondary | ICD-10-CM | POA: Insufficient documentation

## 2020-06-16 LAB — ECHOCARDIOGRAM COMPLETE
Area-P 1/2: 3.48 cm2
S' Lateral: 2.9 cm

## 2020-06-17 ENCOUNTER — Encounter: Payer: Medicaid Other | Admitting: Obstetrics and Gynecology

## 2020-06-17 NOTE — Telephone Encounter (Signed)
Pt's echocardiogram was completed on 06/16/2020.

## 2020-06-23 NOTE — Telephone Encounter (Signed)
Appointment scheduled for f/u with Maxine Glenn, PA-C on 07/18/2020.

## 2020-06-25 ENCOUNTER — Inpatient Hospital Stay (HOSPITAL_COMMUNITY)
Admission: AD | Admit: 2020-06-25 | Discharge: 2020-06-25 | Disposition: A | Discharge status: Home / Self Care | Payer: Medicaid Other | Attending: Obstetrics and Gynecology | Admitting: Obstetrics and Gynecology

## 2020-06-25 ENCOUNTER — Ambulatory Visit: Payer: Self-pay

## 2020-06-25 ENCOUNTER — Other Ambulatory Visit: Payer: Self-pay

## 2020-06-25 ENCOUNTER — Encounter (HOSPITAL_COMMUNITY): Payer: Self-pay | Admitting: Obstetrics and Gynecology

## 2020-06-25 DIAGNOSIS — O99411 Diseases of the circulatory system complicating pregnancy, first trimester: Secondary | ICD-10-CM | POA: Insufficient documentation

## 2020-06-25 DIAGNOSIS — O208 Other hemorrhage in early pregnancy: Secondary | ICD-10-CM | POA: Diagnosis not present

## 2020-06-25 DIAGNOSIS — O209 Hemorrhage in early pregnancy, unspecified: Secondary | ICD-10-CM | POA: Diagnosis not present

## 2020-06-25 DIAGNOSIS — I4891 Unspecified atrial fibrillation: Secondary | ICD-10-CM | POA: Diagnosis not present

## 2020-06-25 DIAGNOSIS — N930 Postcoital and contact bleeding: Secondary | ICD-10-CM | POA: Diagnosis not present

## 2020-06-25 DIAGNOSIS — Z3A13 13 weeks gestation of pregnancy: Secondary | ICD-10-CM | POA: Insufficient documentation

## 2020-06-25 DIAGNOSIS — O09211 Supervision of pregnancy with history of pre-term labor, first trimester: Secondary | ICD-10-CM | POA: Insufficient documentation

## 2020-06-25 DIAGNOSIS — I341 Nonrheumatic mitral (valve) prolapse: Secondary | ICD-10-CM | POA: Diagnosis not present

## 2020-06-25 DIAGNOSIS — O0941 Supervision of pregnancy with grand multiparity, first trimester: Secondary | ICD-10-CM | POA: Diagnosis not present

## 2020-06-25 DIAGNOSIS — O0990 Supervision of high risk pregnancy, unspecified, unspecified trimester: Secondary | ICD-10-CM | POA: Diagnosis not present

## 2020-06-25 DIAGNOSIS — O30041 Twin pregnancy, dichorionic/diamniotic, first trimester: Secondary | ICD-10-CM | POA: Insufficient documentation

## 2020-06-25 LAB — TYPE AND SCREEN
ABO/RH(D): B POS
Antibody Screen: NEGATIVE

## 2020-06-25 LAB — URINALYSIS, ROUTINE W REFLEX MICROSCOPIC
Bilirubin Urine: NEGATIVE
Glucose, UA: NEGATIVE mg/dL
Ketones, ur: NEGATIVE mg/dL
Leukocytes,Ua: NEGATIVE
Nitrite: NEGATIVE
Protein, ur: NEGATIVE mg/dL
Specific Gravity, Urine: 1.013 (ref 1.005–1.030)
pH: 8 (ref 5.0–8.0)

## 2020-06-25 LAB — CBC
HCT: 33.9 % — ABNORMAL LOW (ref 36.0–46.0)
Hemoglobin: 11.4 g/dL — ABNORMAL LOW (ref 12.0–15.0)
MCH: 30.5 pg (ref 26.0–34.0)
MCHC: 33.6 g/dL (ref 30.0–36.0)
MCV: 90.6 fL (ref 80.0–100.0)
Platelets: 178 10*3/uL (ref 150–400)
RBC: 3.74 MIL/uL — ABNORMAL LOW (ref 3.87–5.11)
RDW: 13.8 % (ref 11.5–15.5)
WBC: 9.4 10*3/uL (ref 4.0–10.5)
nRBC: 0 % (ref 0.0–0.2)

## 2020-06-25 LAB — WET PREP, GENITAL
Clue Cells Wet Prep HPF POC: NONE SEEN
Sperm: NONE SEEN
Trich, Wet Prep: NONE SEEN
Yeast Wet Prep HPF POC: NONE SEEN

## 2020-06-25 NOTE — Telephone Encounter (Signed)
  Pt. Reports she saw blood today after voiding and wiping. Reports she is [redacted] weeks pregnant.Has not called her OB/GYN. Reports she is going to ED. Reason for Disposition . [1] Intermittent lower abdominal pain (e.g., cramping) AND [2] present > 24 hours  Answer Assessment - Initial Assessment Questions 1. ONSET: "When did this bleeding start?"       This morning 2. DESCRIPTION: "Describe the bleeding that you are having." "How much bleeding is there?"    - SPOTTING: spotting, or pinkish / brownish mucous discharge; does not fill panti-liner or pad    - MILD:  less than 1 pad / hour; less than patient's usual menstrual bleeding   - MODERATE: 1-2 pads / hour; 1 menstrual cup every 6 hours; small-medium blood clots (e.g., pea, grape, small coin)   - SEVERE: soaking 2 or more pads/hour for 2 or more hours; 1 menstrual cup every 2 hours; bleeding not contained by pads or continuous red blood from vagina; large blood clots (e.g., golf ball, large coin)      Spotting 3. ABDOMINAL PAIN SEVERITY: If present, ask: "How bad is it?"  (e.g., Scale 1-10; mild, moderate, or severe)   - MILD (1-3): doesn't interfere with normal activities, abdomen soft and not tender to touch    - MODERATE (4-7): interferes with normal activities or awakens from sleep, tender to touch    - SEVERE (8-10): excruciating pain, doubled over, unable to do any normal activities     Mild 4. PREGNANCY: "Do you know how many weeks or months pregnant you are?" "When was the first day of your last normal menstrual period?"     13 weeks 5. HEMODYNAMIC STATUS: "Are you weak or feeling lightheaded?" If Yes, ask: "Can you stand and walk normally?"      No 6. OTHER SYMPTOMS: "What other symptoms are you having with the bleeding?" (e.g., passed tissue, vaginal discharge, fever, menstrual-type cramps)     No  Protocols used: PREGNANCY - VAGINAL BLEEDING LESS THAN [redacted] WEEKS EGA-A-AH

## 2020-06-25 NOTE — Discharge Instructions (Signed)

## 2020-06-25 NOTE — MAU Note (Signed)
Presents with c/o spotting w/ wiping.  States when initially wiped Vb was red but was pink with subsequent wiping.  Denies current abdominal pain or cramping, although had cramping last night.

## 2020-06-25 NOTE — MAU Provider Note (Signed)
History     [id number]  Arrival date and time: 06/25/20 1122    Chief Complaint  Patient presents with  . Vaginal Bleeding     HPI Michele Maldonado is a 34 y.o. at 75w2dby early ultrasound with PMHx notable for grand multiparity, thrombocytopenia during prior pregnancies, history of c/s x2, history of preterm delivery, history of nephrotic syndrome in prior pregnancy, history of mitral valve prolapse, afib, history of kell isoimmunization in prior pregnancy, death of a child, current twin pregnancy who presents for vaginal bleeding starting this morning. No history of similar symptoms this pregnancy. Last night endorsed some abdominal cramping post intercourse, which subsequently resolved without intervention. No abnormal vaginal discharge. No dysuria, hematuria, frequency, urgency, flank pain. No n/v/d/c. No fever/chills. No headaches or vision changes. No cp, sob, LE edema.    Review of notes, labs, imaging from prior visits.   --/--/PENDING (08/11 1233)   Past Medical History:  Diagnosis Date  . Anemia   . Atrial [place] ([facility]   . Kell isoimmunization during pregnancy    a. per prior notes - h/o anti Kell alloimmunization.  . MVP (mitral valve prolapse)   . Thrombocytopenia ([facility]   . Ventricular septal defect     Past Surgical History:  Procedure Laterality Date  . CESAREAN SECTION N/A 12/29/2013   Procedure: CESAREAN SECTION;  Surgeon: MEmily Filbert MD;  [facility];  Service: Obstetrics;  Laterality: N/A;  . REPAIR VAGINAL CUFF N/A 12/30/2013   Procedure: REPAIR VAGINAL CUFF;  Surgeon: MEmily Filbert MD;  [facility];  Service: Gynecology;  Laterality: N/A;  Insertion of Bakri Balloon    Family History  Problem Relation Age of Onset  . Hypertension Maternal Grandmother   . Diabetes Maternal Grandmother   . Thyroid disease Mother   . Atrial fibrillation Maternal Grandfather     Social History   Socioeconomic History  . Marital status: Single     Spouse name: Not on file  . Number of children: Not on file  . Years of education: Not on file  . Highest education level: Not on file  Occupational History  . Not on file  Tobacco Use  . Smoking status: Never Smoker  . Smokeless tobacco: Never Used  Vaping Use  . Vaping Use: Never used  Substance and Sexual Activity  . Alcohol use: Not Currently  . Drug use: No    Types: Marijuana    Comment: former  . Sexual activity: Yes    Birth control/protection: None  Other Topics Concern  . Not on file  Social History Narrative  . Not on file   Social Determinants of Health   Financial Resource Strain:   . Difficulty of Paying Living Expenses:   Food Insecurity: No Food Insecurity  . Worried About RCharity fundraiserin the Last Year: Never true  . Ran Out of Food in the Last Year: Never true  Transportation Needs: Unmet Transportation Needs  . Lack of Transportation (Medical): Yes  . Lack of Transportation (Non-Medical): Yes  Physical Activity:   . Days of Exercise per Week:   . Minutes of Exercise per Session:   Stress:   . Feeling of Stress :   Social Connections:   . Frequency of Communication with Friends and Family:   . Frequency of Social Gatherings with Friends and Family:   . Attends Religious Services:   . Active Member of Clubs or Organizations:   . Attends CArchivistMeetings:   .  Marital Status:   Intimate Partner Violence:   . Fear of Current or Ex-Partner:   . Emotionally Abused:   Marland Kitchen Physically Abused:   . Sexually Abused:     No Known Allergies  No current facility-administered medications on file prior to encounter.   Current Outpatient Medications on File Prior to Encounter  Medication Sig Dispense Refill  . Prenatal Vit-Fe Fumarate-FA (MULTIVITAMIN-PRENATAL) 27-0.8 MG TABS tablet Take 1 tablet by mouth daily at 12 noon. 30 tablet 2  . Blood Pressure Monitoring (BLOOD PRESSURE KIT) DEVI 1 Device by Does not apply route as needed. 1 each 0      ROS Pertinent positives and negative per HPI, all others reviewed and negative  Physical Exam   BP (!) 105/59 (BP Location: Right Arm)   Pulse 95   Temp 98.8 F (37.1 C) (Oral)   Resp 20   Ht '5\' 8"'$  (1.727 m)   Wt 75.3 kg   LMP 03/24/2020 (Approximate)   SpO2 100%   BMI 25.23 kg/m   Physical Exam Vitals and nursing note reviewed. Exam conducted with a chaperone present.  Constitutional:      General: She is not in acute distress.    Appearance: Normal appearance. She is normal weight.  HENT:     Head: Normocephalic and atraumatic.     Nose: Nose normal.     Mouth/Throat:     Mouth: Mucous membranes are moist.     Pharynx: Oropharynx is clear.  Eyes:     Extraocular Movements: Extraocular movements intact.     Conjunctiva/sclera: Conjunctivae normal.  Cardiovascular:     Rate and Rhythm: Normal rate.     Pulses: Normal pulses.  Pulmonary:     Effort: Pulmonary effort is normal.  Genitourinary:    Comments: Closed cervix on visualization. Scant blood tinged discharge. No frank bleeding from cervical os. Normal appearing cervix. Musculoskeletal:        General: Normal range of motion.     Cervical back: Normal range of motion and neck supple.  Skin:    General: Skin is warm and dry.  Neurological:     General: No focal deficit present.     Mental Status: She is alert and oriented to person, place, and time. Mental status is at baseline.  Psychiatric:        Mood and Affect: Mood normal.        Behavior: Behavior normal.     Cervical Exam  as noted above  Bedside Ultrasound Not indicated  My interpretation: n/a  FHT A: 145 bpm B: 154 bpm  Labs Results for orders placed or performed during the hospital encounter of 06/25/20 (from the past 24 hour(s))  Urinalysis, Routine w reflex microscopic Urine, Clean Catch     Status: Abnormal   Collection Time: 06/25/20 12:20 PM  Result Value Ref Range   Color, Urine YELLOW YELLOW   APPearance HAZY (A)  CLEAR   Specific Gravity, Urine 1.013 1.005 - 1.030   pH 8.0 5.0 - 8.0   Glucose, UA NEGATIVE NEGATIVE mg/dL   Hgb urine dipstick MODERATE (A) NEGATIVE   Bilirubin Urine NEGATIVE NEGATIVE   Ketones, ur NEGATIVE NEGATIVE mg/dL   Protein, ur NEGATIVE NEGATIVE mg/dL   Nitrite NEGATIVE NEGATIVE   Leukocytes,Ua NEGATIVE NEGATIVE   RBC / HPF 0-5 0 - 5 RBC/hpf   WBC, UA 0-5 0 - 5 WBC/hpf   Bacteria, UA RARE (A) NONE SEEN   Squamous Epithelial / LPF 6-10 0 -  5   Amorphous Crystal PRESENT   CBC     Status: Abnormal   Collection Time: 06/25/20 12:33 PM  Result Value Ref Range   WBC 9.4 4.0 - 10.5 K/uL   RBC 3.74 (L) 3.87 - 5.11 MIL/uL   Hemoglobin 11.4 (L) 12.0 - 15.0 g/dL   HCT 33.9 (L) 36 - 46 %   MCV 90.6 80.0 - 100.0 fL   MCH 30.5 26.0 - 34.0 pg   MCHC 33.6 30.0 - 36.0 g/dL   RDW 13.8 11.5 - 15.5 %   Platelets 178 150 - 400 K/uL   nRBC 0.0 0.0 - 0.2 %  Type and screen     Status: None (Preliminary result)   Collection Time: 06/25/20 12:33 PM  Result Value Ref Range   ABO/RH(D) PENDING    Antibody Screen PENDING    Sample Expiration      06/28/2020,2359 Performed at [facility], [place] [address], [place], [place] [address]   Wet prep, genital     Status: Abnormal   Collection Time: 06/25/20 12:50 PM   Specimen: Cervix  Result Value Ref Range   Yeast Wet Prep HPF POC NONE SEEN NONE SEEN   Trich, Wet Prep NONE SEEN NONE SEEN   Clue Cells Wet Prep HPF POC NONE SEEN NONE SEEN   WBC, Wet Prep HPF POC MANY (A) NONE SEEN   Sperm NONE SEEN     Imaging No results found.  MAU Course  Procedures  Lab Orders     Wet prep, genital     Urinalysis, Routine w reflex microscopic Urine, Clean Catch     CBC No orders of the defined types were placed in this encounter.  Imaging Orders  No imaging studies ordered today    MDM moderate  Assessment and Plan  34yo [id number] at 75w2dwith di-di twins presents for evaluation for vaginal bleeding.  #Post coital vaginal  bleeding Patient with scant blood tinged discharge after intercourse, no associated abnormal vaginal discharge or pain/cramping presents for evaluation. VSS. Cervical exam with scant blood tinged mucous, but closed/normal appearing cervical os. FHT reassuring for both twins. Wet prep and UA unremarkable. Hgb stable. Given reassuring physical exam and FHT, no ultrasound obtained today. Suspect most likely post coital vaginal bleeding. Discussed strict return precaution with patient including worsening bleeding or abdominal cramping. Patient voiced understanding and amendable with plan.  Dispo: discharge home  ARoxanne Gates MD OB Fellow, [place]8/09/2020 1:25 PM

## 2020-06-25 NOTE — MAU Note (Signed)
Pt had intercourse yesterday.

## 2020-06-26 ENCOUNTER — Telehealth (INDEPENDENT_AMBULATORY_CARE_PROVIDER_SITE_OTHER): Payer: Medicaid Other | Admitting: Lactation Services

## 2020-06-26 ENCOUNTER — Ambulatory Visit: Payer: Medicaid Other | Admitting: *Deleted

## 2020-06-26 DIAGNOSIS — O30049 Twin pregnancy, dichorionic/diamniotic, unspecified trimester: Secondary | ICD-10-CM

## 2020-06-26 DIAGNOSIS — O0990 Supervision of high risk pregnancy, unspecified, unspecified trimester: Secondary | ICD-10-CM

## 2020-06-26 LAB — GC/CHLAMYDIA PROBE AMP (~~LOC~~) NOT AT ARMC
Chlamydia: NEGATIVE
Comment: NEGATIVE
Comment: NORMAL
Neisseria Gonorrhea: NEGATIVE

## 2020-06-26 NOTE — Telephone Encounter (Signed)
Returned patients call. Patient has been to MAU and is concerned that due to bleeding her infants are not ok. Patient very upset and wants an [place] to confirm her infants are ok. Spoke with Dr. Shawnie Pons and Diane Day, patient to come to office this morning for [place] by Diane. Patient reports she is on the way.  Front office notified.

## 2020-06-26 NOTE — Progress Notes (Signed)
Pt presents for FHR check following MAU visit yesterday due to vaginal bleeding. Informal bedside US performed. Viable twin pregnancy identified with Twin A  FHR - 146 per PW doppler and Twin B FHR - 160 per PW doppler. Fetal movement was observed with both babies. Pt was able to visualize this real-time scan and voiced feelings of reassurance. Dr. Shawnie Pons notified of results. Pt has next office appt scheduled on 8/26.

## 2020-06-26 NOTE — Progress Notes (Signed)
Patient seen and assessed by nursing staff.  Agree with documentation and plan.  

## 2020-07-03 ENCOUNTER — Encounter: Payer: Self-pay | Admitting: General Practice

## 2020-07-10 ENCOUNTER — Other Ambulatory Visit: Payer: Self-pay

## 2020-07-10 ENCOUNTER — Other Ambulatory Visit (HOSPITAL_COMMUNITY)
Admission: RE | Admit: 2020-07-10 | Discharge: 2020-07-10 | Disposition: A | Discharge status: Home / Self Care | Payer: Medicaid Other | Source: Ambulatory Visit | Attending: Obstetrics and Gynecology | Admitting: Obstetrics and Gynecology

## 2020-07-10 ENCOUNTER — Ambulatory Visit (INDEPENDENT_AMBULATORY_CARE_PROVIDER_SITE_OTHER): Payer: Medicaid Other | Admitting: Obstetrics and Gynecology

## 2020-07-10 VITALS — BP 106/67 | HR 80 | Wt 170.0 lb

## 2020-07-10 DIAGNOSIS — Z87441 Personal history of nephrotic syndrome: Secondary | ICD-10-CM

## 2020-07-10 DIAGNOSIS — I341 Nonrheumatic mitral (valve) prolapse: Secondary | ICD-10-CM

## 2020-07-10 DIAGNOSIS — N898 Other specified noninflammatory disorders of vagina: Secondary | ICD-10-CM | POA: Insufficient documentation

## 2020-07-10 DIAGNOSIS — Z3A15 15 weeks gestation of pregnancy: Secondary | ICD-10-CM

## 2020-07-10 DIAGNOSIS — I482 Chronic atrial fibrillation, unspecified: Secondary | ICD-10-CM

## 2020-07-10 DIAGNOSIS — Z862 Personal history of diseases of the blood and blood-forming organs and certain disorders involving the immune mechanism: Secondary | ICD-10-CM

## 2020-07-10 DIAGNOSIS — O0990 Supervision of high risk pregnancy, unspecified, unspecified trimester: Secondary | ICD-10-CM

## 2020-07-10 DIAGNOSIS — O30042 Twin pregnancy, dichorionic/diamniotic, second trimester: Secondary | ICD-10-CM

## 2020-07-10 DIAGNOSIS — O361920 Maternal care for other isoimmunization, second trimester, not applicable or unspecified: Secondary | ICD-10-CM

## 2020-07-10 DIAGNOSIS — Z98891 History of uterine scar from previous surgery: Secondary | ICD-10-CM

## 2020-07-10 DIAGNOSIS — O34219 Maternal care for unspecified type scar from previous cesarean delivery: Secondary | ICD-10-CM

## 2020-07-10 LAB — POCT URINALYSIS DIP (DEVICE)
Bilirubin Urine: NEGATIVE
Glucose, UA: NEGATIVE mg/dL
Hgb urine dipstick: NEGATIVE
Ketones, ur: NEGATIVE mg/dL
Leukocytes,Ua: NEGATIVE
Nitrite: NEGATIVE
Protein, ur: NEGATIVE mg/dL
Specific Gravity, Urine: 1.025 (ref 1.005–1.030)
Urobilinogen, UA: 0.2 mg/dL (ref 0.0–1.0)
pH: 7 (ref 5.0–8.0)

## 2020-07-10 NOTE — Progress Notes (Signed)
Having vaginal discharge and irritation, explained how to complete a self swab and patient completed and brought back to me.

## 2020-07-10 NOTE — Progress Notes (Signed)
   PRENATAL VISIT NOTE  Subjective:  Michele Maldonado is a 34 y.o. [id number] at [redacted]w[redacted]d being seen today for ongoing prenatal care.  She is currently monitored for the following issues for this high-risk pregnancy and has Hx of thrombocytopenia; History of 2 cesarean sections; History of nephrotic syndrome; Mitral valve prolapse; Kell isoimmunization in pregnancy; Death of child; A-fib [facility]); Supervision of high risk pregnancy, antepartum; Twin pregnancy; and [redacted] weeks gestation of pregnancy on their problem list.  Patient doing well with no acute concerns today. She reports no complaints.  Contractions: Not present. Vag. Bleeding: None.  Movement: Present. Denies leaking of fluid.   Pt was very concerned about kell isoimmunization and wanted to know plan for follow up.  Pt advised she will likely be getting multiple u/s for growth and fetal dopplers this pregnancy.  The following portions of the patient's history were reviewed and updated as appropriate: allergies, current medications, past family history, past medical history, past social history, past surgical history and problem list. Problem list updated.  Objective:   Vitals:   07/10/20 0951  BP: 106/67  Pulse: 80  Weight: 170 lb (77.1 kg)    Fetal Status: Fetal Heart Rate (bpm): 158/154   Movement: Present     General:  Alert, oriented and cooperative. Patient is in no acute distress.  Skin: Skin is warm and dry. No rash noted.   Cardiovascular: Normal heart rate noted  Respiratory: Normal respiratory effort, no problems with respiration noted  Abdomen: Soft, gravid, appropriate for gestational age.  Pain/Pressure: Present     Pelvic: Cervical exam deferred        Extremities: Normal range of motion.  Edema: None  Mental Status:  Normal mood and affect. Normal behavior. Normal judgment and thought content.   Assessment and Plan:  Pregnancy: [id number] at [redacted]w[redacted]d  1. Supervision of high risk pregnancy, antepartum   2. Vaginal  discharge  - Cervicovaginal ancillary only( [place])  3. Mitral valve prolapse Currently on no blood thinners, has cardiology appointment on September 13  4. Hx of thrombocytopenia Monitor at 26-28 weeks  5. History of 2 cesarean sections Discussed possible TOLAC if in spontaneous labor, but probable repeat c/s if not laboring  6. Dichorionic diamniotic twin pregnancy in second trimester Per MFM growth scans  7. Kell isoimmunization during pregnancy in second trimester, single or unspecified fetus Fetal doppler or other intervention per MFM  8. History of nephrotic syndrome  - Protein / creatinine ratio, urine  9.  A-fib:  Pt on no meds at this time, will defer to cards after sept 13 appointment Pt is not in constant a fib  Preterm labor symptoms and general obstetric precautions including but not limited to vaginal bleeding, contractions, leaking of fluid and fetal movement were reviewed in detail with the patient.  Please refer to After Visit Summary for other counseling recommendations.   Return in about 3 weeks (around 07/31/2020) for [facility], in person.   Mariel Aloe, MD

## 2020-07-10 NOTE — Patient Instructions (Signed)
National Guideline Alliance ([place]).Twin and Triplet Pregnancy. [place]: National Institute for Health and Care Excellence (UK); 2019.">  Multiple Pregnancy Multiple pregnancy means that a woman is carrying more than one baby at a time. She may be pregnant with twins, triplets, or more. The majority of multiple pregnancies are twins. Naturally conceiving triplets or more (higher-order multiples) is rare. Multiple pregnancies are riskier than single pregnancies. A woman with a multiple pregnancy is more likely to have certain problems during her pregnancy. How does a multiple pregnancy happen? A multiple pregnancy happens when:  The woman's body releases more than one egg at a time, and then each egg gets fertilized by a different sperm. ? This is the most common type of multiple pregnancy. ? Twins or other multiples produced this way are called fraternal. They are no more alike than non-multiple siblings are.  One sperm fertilizes one egg, which then divides into more than one embryo. ? Twins or other multiples produced this way are called identical. Identical multiples are always the same gender, and they look very much alike. Who is most likely to have a multiple pregnancy? A multiple pregnancy is more likely to develop in women who:  Have had fertility treatment, especially if the treatment included fertility medicines.  Are older than 32 years of age.  Have already had four or more children.  Have a family history of multiple pregnancy. How is a multiple pregnancy diagnosed? A multiple pregnancy may be diagnosed based on:  Symptoms such as: ? Rapid weight gain in the first 3 months of pregnancy (first trimester). ? More severe nausea and breast tenderness than what is typical of a single pregnancy. ? A larger uterus than what is normal for the stage of the pregnancy.  Blood tests that detect a higher-than-normal level of human chorionic gonadotropin (hCG). This is a hormone that your  body produces in early pregnancy.  An ultrasound exam. This is used to confirm that you are carrying multiples. What risks come with multiple pregnancy? A multiple pregnancy puts you at a higher risk for certain problems during or after your pregnancy. These include:  Delivering your babies before your due date (preterm birth). A full-term pregnancy lasts for at least 37 weeks. ? Babies born before 37 weeks may have a higher risk for breathing problems, feeding difficulties, cerebral palsy, and learning disabilities.  Diabetes.  Preeclampsia. This is a serious condition that causes high blood pressure and headaches during pregnancy.  Too much blood loss after childbirth (postpartum hemorrhage).  Postpartum depression.  Low birth weight of the babies. How will having a multiple pregnancy affect my care? Your health care team will monitor you more closely. You may need more frequent prenatal visits. This will ensure that you are healthy and that your babies are growing normally. Follow these instructions at home: Eating and drinking  Increase your nutrition. ? Follow your health care provider's recommendations for weight gain. You may need to gain a little extra weight when you are pregnant with multiples. ? Eat healthy snacks often throughout the day. This will add calories and reduce nausea.  Drink enough fluid to keep your urine pale yellow.  Take prenatal vitamins. Ask your health care provider what vitamins are right for you. Activity Limit your activities by 20-24 weeks of pregnancy.  Rest often.  Avoid activities, exercise, and work that take a lot of effort.  Ask your health care provider when you should stop having sex. General instructions  Do not use any   products that contain nicotine or tobacco, such as cigarettes, e-cigarettes, and chewing tobacco. If you need help quitting, ask your health care provider.  Do not drink alcohol or use illegal drugs.  Take  over-the-counter and prescription medicines only as told by your health care provider.  Arrange for extra help around the house.  Keep all follow-up visits and all prenatal visits as told by your health care provider. This is important. Where to find more information  American College of Obstetricians and Gynecology: www.acog.org Contact a health care provider if:  You have dizziness.  You have nausea, vomiting, or diarrhea that does not go away.  You have depression or other emotions that are interfering with your normal activities.  You have a fever.  You have pain with urination.  You have a bad-smelling vaginal discharge.  You notice increased swelling in your face, hands, legs, or ankles. Get help right away if:  You have fluid leaking from your vagina.  You have bleeding from your vagina.  You have pelvic cramps, pelvic pressure, or nagging pain in your abdomen or lower back.  You are having regular contractions.  You have a severe headache, with or without changes in how you see.  You have chest pain or shortness of breath.  You notice that your babies move less often, or do not move at all. Summary  Having a multiple pregnancy means that a woman is carrying more than one baby at a time.  A multiple pregnancy puts you at a higher risk for delivering your babies before your due date, having diabetes, preeclampsia, too much blood loss after childbirth, or low birth weight of the babies.  Your health care provider will monitor you more closely during your pregnancy.  You may need to make some lifestyle changes during pregnancy. This includes eating more, limiting your activities after 20-24 weeks of pregnancy, and arranging for extra help around the house.  Follow up with your health care provider as instructed if you experience any complications. This information is not intended to replace advice given to you by your health care provider. Make sure you discuss  any questions you have with your health care provider. Document Revised: 06/25/2019 Document Reviewed: 06/25/2019 Elsevier Patient Education  2020 [employer].  

## 2020-07-11 LAB — CERVICOVAGINAL ANCILLARY ONLY
Bacterial Vaginitis (gardnerella): POSITIVE — AB
Candida Glabrata: NEGATIVE
Candida Vaginitis: POSITIVE — AB
Chlamydia: NEGATIVE
Comment: NEGATIVE
Comment: NEGATIVE
Comment: NEGATIVE
Comment: NEGATIVE
Comment: NEGATIVE
Comment: NORMAL
Neisseria Gonorrhea: NEGATIVE
Trichomonas: NEGATIVE

## 2020-07-11 LAB — PROTEIN / CREATININE RATIO, URINE
Creatinine, Urine: 100.2 mg/dL
Protein, Ur: 15.5 mg/dL
Protein/Creat Ratio: 155 mg/g creat (ref 0–200)

## 2020-07-14 ENCOUNTER — Other Ambulatory Visit: Payer: Self-pay

## 2020-07-14 DIAGNOSIS — N760 Acute vaginitis: Secondary | ICD-10-CM

## 2020-07-14 DIAGNOSIS — B379 Candidiasis, unspecified: Secondary | ICD-10-CM

## 2020-07-14 MED ORDER — METRONIDAZOLE 500 MG PO TABS: 500.0000 mg | ORAL_TABLET | Freq: Two times a day (BID) | ORAL | 0 refills | Status: DC

## 2020-07-14 MED ORDER — TERCONAZOLE 0.4 % VA CREA: 1.0000 | TOPICAL_CREAM | Freq: Every day | VAGINAL | 0 refills | Status: DC

## 2020-07-16 DIAGNOSIS — Z419 Encounter for procedure for purposes other than remedying health state, unspecified: Secondary | ICD-10-CM | POA: Diagnosis not present

## 2020-07-18 ENCOUNTER — Ambulatory Visit: Payer: Medicaid Other | Admitting: Student

## 2020-07-27 NOTE — Progress Notes (Signed)
PCP:  Patient, No Pcp Per Primary Cardiologist: Michele Casino, MD Electrophysiologist: Dr. Lindaann Slough Michele Maldonado is a 34 y.o. female seen today for Virl Axe, MD for routine electrophysiology followup.  Since last being seen in our clinic the patient reports doing very well. No issues with the pregnancy. She denies unilateral swelling or pain. She is not sure why she was never able to have the CT completed. Echo with normal EF. Mild SOB with staris. .  she denies chest pain, palpitations, PND, orthopnea, nausea, vomiting, dizziness, syncope, edema, weight gain, or early satiety.  Past Medical History:  Diagnosis Date  . Anemia   . Atrial fibrillation ([place])   . Kell isoimmunization during pregnancy    a. per prior notes - h/o anti Kell alloimmunization.  . MVP (mitral valve prolapse)   . Thrombocytopenia ([place])   . Ventricular septal defect    Past Surgical History:  Procedure Laterality Date  . CESAREAN SECTION N/A 12/29/2013   Procedure: CESAREAN SECTION;  Surgeon: Emily Filbert, MD;  Location: [facility];  Service: Obstetrics;  Laterality: N/A;  . REPAIR VAGINAL CUFF N/A 12/30/2013   Procedure: REPAIR VAGINAL CUFF;  Surgeon: Emily Filbert, MD;  Location: [facility];  Service: Gynecology;  Laterality: N/A;  Insertion of Bakri Balloon    Current Outpatient Medications  Medication Sig Dispense Refill  . Blood Pressure Monitoring (BLOOD PRESSURE KIT) DEVI 1 Device by Does not apply route as needed. 1 each 0  . Prenatal Vit-Fe Fumarate-FA (MULTIVITAMIN-PRENATAL) 27-0.8 MG TABS tablet Take 1 tablet by mouth daily at 12 noon. 30 tablet 2   No current facility-administered medications for this visit.    No Known Allergies  Social History   Socioeconomic History  . Marital status: Single    Spouse name: Not on file  . Number of children: Not on file  . Years of education: Not on file  . Highest education level: Not on file  Occupational History  . Not on file  Tobacco Use  . Smoking  status: Never Smoker  . Smokeless tobacco: Never Used  Vaping Use  . Vaping Use: Never used  Substance and Sexual Activity  . Alcohol use: Not Currently  . Drug use: No    Types: Marijuana    Comment: former  . Sexual activity: Yes    Birth control/protection: None  Other Topics Concern  . Not on file  Social History Narrative  . Not on file   Social Determinants of Health   Financial Resource Strain:   . Difficulty of Paying Living Expenses: Not on file  Food Insecurity: No Food Insecurity  . Worried About Charity fundraiser in the Last Year: Never true  . Ran Out of Food in the Last Year: Never true  Transportation Needs: No Transportation Needs  . Lack of Transportation (Medical): No  . Lack of Transportation (Non-Medical): No  Physical Activity:   . Days of Exercise per Week: Not on file  . Minutes of Exercise per Session: Not on file  Stress:   . Feeling of Stress : Not on file  Social Connections:   . Frequency of Communication with Friends and Family: Not on file  . Frequency of Social Gatherings with Friends and Family: Not on file  . Attends Religious Services: Not on file  . Active Member of Clubs or Organizations: Not on file  . Attends Archivist Meetings: Not on file  . Marital Status: Not on file  Intimate Partner Violence:   . Fear of Current or Ex-Partner: Not on file  . Emotionally Abused: Not on file  . Physically Abused: Not on file  . Sexually Abused: Not on file   Review of Systems: General: No chills, fever, night sweats or weight changes  Cardiovascular:  No chest pain, dyspnea on exertion, edema, orthopnea, palpitations, paroxysmal nocturnal dyspnea Dermatological: No rash, lesions or masses Respiratory: No cough, dyspnea Urologic: No hematuria, dysuria Abdominal: No nausea, vomiting, diarrhea, bright red blood per rectum, melena, or hematemesis Neurologic: No visual changes, weakness, changes in mental status All other systems  reviewed and are otherwise negative except as noted above.  Physical Exam: Vitals:   07/28/20 0823  BP: 108/60  Pulse: 76  SpO2: 99%  Weight: 183 lb (83 kg)  Height: _0  (1.727 m)    GEN- The patient is well appearing, alert and oriented x 3 today.   HEENT: normocephalic, atraumatic; sclera clear, conjunctiva pink; hearing intact; oropharynx clear; neck supple, no JVP Lymph- no cervical lymphadenopathy Lungs- Clear to ausculation bilaterally, normal work of breathing.  No wheezes, rales, rhonchi Heart- Regular rate and rhythm, no murmurs, rubs or gallops, PMI not laterally displaced GI- soft, non-tender, non-distended, bowel sounds present, no hepatosplenomegaly Extremities- no clubbing, cyanosis, or edema; DP/PT/radial pulses 2+ bilaterally MS- no significant deformity or atrophy Skin- warm and dry, no rash or lesion Psych- euthymic mood, full affect Neuro- strength and sensation are intact  EKG is not ordered.   Additional studies reviewed include: Echo 06/16/2020 LVEF 55-60%  Assessment and Plan:  Paroxysmal atrial fibrillation She has short, 3-5 minute, tachy-palpitations 1-2 times a week, usually when overheated. Regular on exam today.   Pregnancy Expecting twins, due 12/29/2020  VSD-spontaneously closure  Kell Ab autosensitization  Follow with OB  Elevated D-Dimer Discussed with Dr. Caryl Comes.  Usual time to resolution of PE > 68% at 3 month mark.  Will get DVT US as 3 month resolution of this would be much lower, and unsure why she would be having AF without trigger.  She is doing well overall, DVT US as above, then 6-8 week follow up.   Shirley Friar, PA-C  07/28/20 8:45 AM

## 2020-07-28 ENCOUNTER — Encounter: Payer: Self-pay | Admitting: Student

## 2020-07-28 ENCOUNTER — Ambulatory Visit (INDEPENDENT_AMBULATORY_CARE_PROVIDER_SITE_OTHER): Payer: Medicaid Other | Admitting: Student

## 2020-07-28 ENCOUNTER — Other Ambulatory Visit: Payer: Self-pay

## 2020-07-28 VITALS — BP 108/60 | HR 76 | Ht 68.0 in | Wt 183.0 lb

## 2020-07-28 DIAGNOSIS — I480 Paroxysmal atrial fibrillation: Secondary | ICD-10-CM | POA: Diagnosis not present

## 2020-07-28 DIAGNOSIS — Z3A00 Weeks of gestation of pregnancy not specified: Secondary | ICD-10-CM | POA: Diagnosis not present

## 2020-07-28 DIAGNOSIS — R7989 Other specified abnormal findings of blood chemistry: Secondary | ICD-10-CM

## 2020-07-28 DIAGNOSIS — Z3490 Encounter for supervision of normal pregnancy, unspecified, unspecified trimester: Secondary | ICD-10-CM

## 2020-07-28 DIAGNOSIS — O99419 Diseases of the circulatory system complicating pregnancy, unspecified trimester: Secondary | ICD-10-CM

## 2020-07-28 NOTE — Patient Instructions (Signed)
Medication Instructions:  *If you need a refill on your cardiac medications before your next appointment, please call your pharmacy*  Testing/Procedures: Your physician has requested that you have a lower extremity venous duplex. This test is an ultrasound of the veins in the legs or arms. It looks at venous blood flow that carries blood from the heart to the legs or arms. Allow one hour for a Lower Venous exam. Allow thirty minutes for an Upper Venous exam. There are no restrictions or special instructions.  Follow-Up: At [facility], you and your health needs are our priority.  As part of our continuing mission to provide you with exceptional heart care, we have created designated Provider Care Teams.  These Care Teams include your primary Cardiologist (physician) and Advanced Practice Providers (APPs -  Physician Assistants and Nurse Practitioners) who all work together to provide you with the care you need, when you need it.  We recommend signing up for the patient portal called "MyChart".  Sign up information is provided on this After Visit Summary.  MyChart is used to connect with patients for Virtual Visits (Telemedicine).  Patients are able to view lab/test results, encounter notes, upcoming appointments, etc.  Non-urgent messages can be sent to your provider as well.   To learn more about what you can do with MyChart, go to [email or web address].    Your next appointment:   Your physician recommends that you schedule a follow-up appointment in: 6-8 WEEKS with Dr. Graciela Husbands   The format for your next appointment:   In Person with Sherryl Manges, MD

## 2020-07-29 ENCOUNTER — Other Ambulatory Visit: Payer: Self-pay | Admitting: Obstetrics and Gynecology

## 2020-07-29 ENCOUNTER — Encounter: Payer: Self-pay | Admitting: *Deleted

## 2020-07-29 ENCOUNTER — Ambulatory Visit: Payer: Medicaid Other | Attending: Obstetrics

## 2020-07-29 ENCOUNTER — Encounter: Payer: Self-pay | Admitting: General Practice

## 2020-07-29 ENCOUNTER — Ambulatory Visit: Payer: Medicaid Other | Admitting: *Deleted

## 2020-07-29 DIAGNOSIS — O361920 Maternal care for other isoimmunization, second trimester, not applicable or unspecified: Secondary | ICD-10-CM

## 2020-07-29 DIAGNOSIS — O0990 Supervision of high risk pregnancy, unspecified, unspecified trimester: Secondary | ICD-10-CM

## 2020-07-30 ENCOUNTER — Other Ambulatory Visit: Payer: Self-pay | Admitting: *Deleted

## 2020-07-30 DIAGNOSIS — O30049 Twin pregnancy, dichorionic/diamniotic, unspecified trimester: Secondary | ICD-10-CM

## 2020-07-31 ENCOUNTER — Other Ambulatory Visit: Payer: Self-pay

## 2020-07-31 ENCOUNTER — Ambulatory Visit (INDEPENDENT_AMBULATORY_CARE_PROVIDER_SITE_OTHER): Payer: Medicaid Other | Admitting: Family Medicine

## 2020-07-31 VITALS — BP 105/69 | HR 90 | Wt 183.0 lb

## 2020-07-31 DIAGNOSIS — Z87441 Personal history of nephrotic syndrome: Secondary | ICD-10-CM

## 2020-07-31 DIAGNOSIS — I480 Paroxysmal atrial fibrillation: Secondary | ICD-10-CM

## 2020-07-31 DIAGNOSIS — Z23 Encounter for immunization: Secondary | ICD-10-CM | POA: Diagnosis not present

## 2020-07-31 DIAGNOSIS — O361920 Maternal care for other isoimmunization, second trimester, not applicable or unspecified: Secondary | ICD-10-CM

## 2020-07-31 DIAGNOSIS — O0990 Supervision of high risk pregnancy, unspecified, unspecified trimester: Secondary | ICD-10-CM

## 2020-07-31 DIAGNOSIS — O30042 Twin pregnancy, dichorionic/diamniotic, second trimester: Secondary | ICD-10-CM

## 2020-07-31 DIAGNOSIS — I341 Nonrheumatic mitral (valve) prolapse: Secondary | ICD-10-CM

## 2020-07-31 DIAGNOSIS — Z98891 History of uterine scar from previous surgery: Secondary | ICD-10-CM

## 2020-07-31 NOTE — Patient Instructions (Signed)
Twin Dietary Recommendations:  . Eat every 3-4 hours: at least 3 meals plus 2-3 snacks per day . Iron supplement twice a day and lean protein: fish, poultry, dairy, nuts . Complex carbohydrates: fruits, legumes, and whole grains . Daily mineral supplements: Calcium 3g, Magnesium 1.2g, Zinc 45mg . Healthy fats: mono and poly unsaturated (olive oil, canola oil, nuts, seeds). Limit saturated fats and trans fats . Omega-3 fatty acid rich fish protein 2-4x per week . BMI specific dietary recommendations for calories and protein  

## 2020-07-31 NOTE — Progress Notes (Signed)
   PRENATAL VISIT NOTE  Subjective:  Michele Maldonado is a 34 y.o. [id number] at [redacted]w[redacted]d being seen today for ongoing prenatal care.  She is currently monitored for the following issues for this high-risk pregnancy and has Hx of thrombocytopenia; History of 2 cesarean sections; History of nephrotic syndrome; Mitral valve prolapse; Kell isoimmunization in pregnancy; Death of child; A-fib [facility]); Supervision of high risk pregnancy, antepartum; and Twin pregnancy on their problem list.  Patient reports no complaints.  Contractions: Not present. Vag. Bleeding: None.  Movement: Present. Denies leaking of fluid.   The following portions of the patient's history were reviewed and updated as appropriate: allergies, current medications, past family history, past medical history, past social history, past surgical history and problem list.   Objective:   Vitals:   07/31/20 1030  BP: 105/69  Pulse: 90  Weight: 183 lb (83 kg)    Fetal Status: Fetal Heart Rate (bpm): 154/142   Movement: Present     General:  Alert, oriented and cooperative. Patient is in no acute distress.  Skin: Skin is warm and dry. No rash noted.   Cardiovascular: Normal heart rate noted  Respiratory: Normal respiratory effort, no problems with respiration noted  Abdomen: Soft, gravid, appropriate for gestational age.  Pain/Pressure: Absent     Pelvic: Cervical exam deferred        Extremities: Normal range of motion.  Edema: None  Mental Status: Normal mood and affect. Normal behavior. Normal judgment and thought content.   Assessment and Plan:  Pregnancy: [id number] at [redacted]w[redacted]d 1. Supervision of high risk pregnancy, antepartum FHT normal  2. Dichorionic diamniotic twin pregnancy in second trimester Growth [place] every 4 weeks On ASA 81mg  - will increase to 160mg  (additional benefit shown in multiple gestations).  3. History of nephrotic syndrome Maximize protein intake  4. History of 2 cesarean sections Desires TOLAC - discussed  that our office policy is not to induce people who have had 2 prior cesarean deliveries, due to the incidence of uterine rupture. If she comes in labor, then it would be possible.  5. Mitral valve prolapse  6. Paroxysmal atrial fibrillation (HCC) Mild intermittent palpitations On ASA 81mg  - will increase to 160mg  (additional benefit shown in multiple gestations). She had an elevated Ddimer in June. It appears that there is some concern for her having an occult DVT  7. Kell isoimmunization during pregnancy in second trimester, single or unspecified fetus Last antibody screen was negative. Had weak Kell antibody in 2019. Will recheck with 28 week labs.   Preterm labor symptoms and general obstetric precautions including but not limited to vaginal bleeding, contractions, leaking of fluid and fetal movement were reviewed in detail with the patient. Please refer to After Visit Summary for other counseling recommendations.   No follow-ups on file.  Future Appointments  Date Time Provider Department Center  08/07/2020  3:00 PM [facility] VASC 2 [facility]  08/26/2020  2:45 PM [facility] NURSE [facility]  08/26/2020  3:00 PM [facility] US1 [facility]  08/29/2020 10:55 AM [facility] DO [facility]  09/17/2020  8:45 AM Levie Heritage, MD [facility]    [facility], DO

## 2020-08-07 ENCOUNTER — Ambulatory Visit (HOSPITAL_COMMUNITY)
Admission: RE | Admit: 2020-08-07 | Discharge: 2020-08-07 | Disposition: A | Discharge status: Home / Self Care | Payer: Medicaid Other | Source: Ambulatory Visit | Attending: Cardiology | Admitting: Cardiology

## 2020-08-07 ENCOUNTER — Other Ambulatory Visit: Payer: Self-pay

## 2020-08-07 ENCOUNTER — Encounter (HOSPITAL_COMMUNITY): Payer: Medicaid Other

## 2020-08-07 DIAGNOSIS — R7989 Other specified abnormal findings of blood chemistry: Secondary | ICD-10-CM

## 2020-08-11 ENCOUNTER — Other Ambulatory Visit: Payer: Self-pay

## 2020-08-11 ENCOUNTER — Telehealth: Payer: Self-pay

## 2020-08-11 DIAGNOSIS — O0990 Supervision of high risk pregnancy, unspecified, unspecified trimester: Secondary | ICD-10-CM

## 2020-08-11 MED ORDER — PRENATAL PLUS 27-1 MG PO TABS: 1.0000 | ORAL_TABLET | Freq: Every day | ORAL | 0 refills | Status: DC

## 2020-08-11 NOTE — Telephone Encounter (Signed)
-----   Message from Graciella Freer, PA-C sent at 08/07/2020  3:50 PM EDT ----- Please let her know her [place] looked normal. No clots seen.   Thank you!Casimiro Needle [address] [place], [place]  08/07/2020 3:49 PM

## 2020-08-11 NOTE — Progress Notes (Signed)
prenat 

## 2020-08-11 NOTE — Telephone Encounter (Signed)
Pt is aware and agreeable to normal results  

## 2020-08-15 ENCOUNTER — Other Ambulatory Visit: Payer: Self-pay | Admitting: Family Medicine

## 2020-08-15 DIAGNOSIS — Z419 Encounter for procedure for purposes other than remedying health state, unspecified: Secondary | ICD-10-CM | POA: Diagnosis not present

## 2020-08-15 DIAGNOSIS — O0990 Supervision of high risk pregnancy, unspecified, unspecified trimester: Secondary | ICD-10-CM

## 2020-08-16 ENCOUNTER — Other Ambulatory Visit: Payer: Self-pay

## 2020-08-16 DIAGNOSIS — O0990 Supervision of high risk pregnancy, unspecified, unspecified trimester: Secondary | ICD-10-CM

## 2020-08-18 ENCOUNTER — Other Ambulatory Visit: Payer: Self-pay

## 2020-08-18 DIAGNOSIS — O0990 Supervision of high risk pregnancy, unspecified, unspecified trimester: Secondary | ICD-10-CM

## 2020-08-18 MED ORDER — PRENATAL PLUS 27-1 MG PO TABS: 1.0000 | ORAL_TABLET | Freq: Every day | ORAL | 6 refills | Status: DC

## 2020-08-18 NOTE — Telephone Encounter (Signed)
Order previously sent to pharmacy by provider.

## 2020-08-19 ENCOUNTER — Other Ambulatory Visit: Payer: Self-pay | Admitting: *Deleted

## 2020-08-19 ENCOUNTER — Ambulatory Visit (HOSPITAL_BASED_OUTPATIENT_CLINIC_OR_DEPARTMENT_OTHER): Payer: Medicaid Other

## 2020-08-19 ENCOUNTER — Ambulatory Visit: Payer: Medicaid Other | Attending: Obstetrics and Gynecology | Admitting: *Deleted

## 2020-08-19 ENCOUNTER — Encounter: Payer: Self-pay | Admitting: *Deleted

## 2020-08-19 ENCOUNTER — Other Ambulatory Visit: Payer: Self-pay

## 2020-08-19 DIAGNOSIS — Z362 Encounter for other antenatal screening follow-up: Secondary | ICD-10-CM | POA: Diagnosis not present

## 2020-08-19 DIAGNOSIS — O30042 Twin pregnancy, dichorionic/diamniotic, second trimester: Secondary | ICD-10-CM | POA: Diagnosis not present

## 2020-08-19 DIAGNOSIS — Z3A21 21 weeks gestation of pregnancy: Secondary | ICD-10-CM | POA: Insufficient documentation

## 2020-08-19 DIAGNOSIS — O09292 Supervision of pregnancy with other poor reproductive or obstetric history, second trimester: Secondary | ICD-10-CM | POA: Diagnosis not present

## 2020-08-19 DIAGNOSIS — O0942 Supervision of pregnancy with grand multiparity, second trimester: Secondary | ICD-10-CM | POA: Insufficient documentation

## 2020-08-19 DIAGNOSIS — O322XX2 Maternal care for transverse and oblique lie, fetus 2: Secondary | ICD-10-CM | POA: Diagnosis not present

## 2020-08-19 DIAGNOSIS — D696 Thrombocytopenia, unspecified: Secondary | ICD-10-CM | POA: Insufficient documentation

## 2020-08-19 DIAGNOSIS — O34219 Maternal care for unspecified type scar from previous cesarean delivery: Secondary | ICD-10-CM

## 2020-08-19 DIAGNOSIS — O322XX1 Maternal care for transverse and oblique lie, fetus 1: Secondary | ICD-10-CM

## 2020-08-19 DIAGNOSIS — O0990 Supervision of high risk pregnancy, unspecified, unspecified trimester: Secondary | ICD-10-CM

## 2020-08-19 DIAGNOSIS — O99112 Other diseases of the blood and blood-forming organs and certain disorders involving the immune mechanism complicating pregnancy, second trimester: Secondary | ICD-10-CM | POA: Diagnosis not present

## 2020-08-19 DIAGNOSIS — O30049 Twin pregnancy, dichorionic/diamniotic, unspecified trimester: Secondary | ICD-10-CM

## 2020-08-21 ENCOUNTER — Encounter: Payer: Self-pay | Admitting: Obstetrics and Gynecology

## 2020-08-26 ENCOUNTER — Ambulatory Visit: Payer: Medicaid Other

## 2020-08-29 ENCOUNTER — Encounter: Payer: Medicaid Other | Admitting: Family Medicine

## 2020-09-03 ENCOUNTER — Other Ambulatory Visit: Payer: Self-pay

## 2020-09-03 ENCOUNTER — Ambulatory Visit (INDEPENDENT_AMBULATORY_CARE_PROVIDER_SITE_OTHER): Payer: Medicaid Other | Admitting: Family Medicine

## 2020-09-03 VITALS — BP 111/61 | HR 94 | Wt 196.9 lb

## 2020-09-03 DIAGNOSIS — Z87441 Personal history of nephrotic syndrome: Secondary | ICD-10-CM

## 2020-09-03 DIAGNOSIS — O30042 Twin pregnancy, dichorionic/diamniotic, second trimester: Secondary | ICD-10-CM

## 2020-09-03 DIAGNOSIS — O361920 Maternal care for other isoimmunization, second trimester, not applicable or unspecified: Secondary | ICD-10-CM

## 2020-09-03 DIAGNOSIS — Z98891 History of uterine scar from previous surgery: Secondary | ICD-10-CM

## 2020-09-03 DIAGNOSIS — O0990 Supervision of high risk pregnancy, unspecified, unspecified trimester: Secondary | ICD-10-CM | POA: Diagnosis not present

## 2020-09-03 DIAGNOSIS — I480 Paroxysmal atrial fibrillation: Secondary | ICD-10-CM

## 2020-09-04 NOTE — Progress Notes (Signed)
   PRENATAL VISIT NOTE  Subjective:  Michele Maldonado is a 34 y.o. [id number] at [redacted]w[redacted]d being seen today for ongoing prenatal care.  She is currently monitored for the following issues for this high-risk pregnancy and has Hx of thrombocytopenia; History of 2 cesarean sections; History of nephrotic syndrome; Mitral valve prolapse; Kell isoimmunization in pregnancy; Death of child; A-fib [facility]); Supervision of high risk pregnancy, antepartum; and Twin pregnancy on their problem list.  Patient reports no complaints.  Contractions: Not present. Vag. Bleeding: None.  Movement: Present. Denies leaking of fluid.   The following portions of the patient's history were reviewed and updated as appropriate: allergies, current medications, past family history, past medical history, past social history, past surgical history and problem list.   Objective:   Vitals:   09/03/20 1558  BP: 111/61  Pulse: 94  Weight: 196 lb 14.4 oz (89.3 kg)    Fetal Status: Fetal Heart Rate (bpm): 147/140   Movement: Present     General:  Alert, oriented and cooperative. Patient is in no acute distress.  Skin: Skin is warm and dry. No rash noted.   Cardiovascular: Normal heart rate noted  Respiratory: Normal respiratory effort, no problems with respiration noted  Abdomen: Soft, gravid, appropriate for gestational age.  Pain/Pressure: Present     Pelvic: Cervical exam deferred        Extremities: Normal range of motion.  Edema: None  Mental Status: Normal mood and affect. Normal behavior. Normal judgment and thought content.   Assessment and Plan:  Pregnancy: [id number] at [redacted]w[redacted]d 1. Supervision of high risk pregnancy, antepartum AFP today - AFP, Serum, Open Spina Bifida - Antibody screen  2. Paroxysmal atrial fibrillation (HCC)   3. Kell isoimmunization during pregnancy in second trimester, twins  Neg Ab screen earlier in pregnancy, will repeat today  4. Dichorionic diamniotic twin pregnancy in second  trimester Concordant growth thus far  5. History of 2 cesarean sections Wants TOLAC--reviewed twins, malpresentation and no induction is allowed  6. History of nephrotic syndrome No evidence for now  General obstetric precautions including but not limited to vaginal bleeding, contractions, leaking of fluid and fetal movement were reviewed in detail with the patient. Please refer to After Visit Summary for other counseling recommendations.   Return in about 4 weeks (around 10/01/2020) for [facility], needs MD, in person twins, 28 wk labs.  Future Appointments  Date Time Provider Department Center  09/15/2020  7:45 AM [facility] NURSE [facility]  09/15/2020  8:00 AM [facility] US1 [facility]  09/17/2020  8:45 AM Duke Salvia, MD [facility]  10/01/2020 10:55 AM Warden Fillers, MD [facility]    Reva Bores, MD

## 2020-09-05 LAB — AFP, SERUM, OPEN SPINA BIFIDA
AFP MoM: 2.08
AFP Value: 168.4 ng/mL
Gest. Age on Collection Date: 23.2 weeks
Maternal Age At EDD: 33 yr
OSBR Risk 1 IN: 2880
Test Results:: NEGATIVE
Weight: 197 [lb_av]

## 2020-09-05 LAB — AB SCR+ANTIBODY ID: Antibody Screen: POSITIVE — AB

## 2020-09-05 LAB — ANTIBODY SCREEN

## 2020-09-08 ENCOUNTER — Telehealth: Payer: Self-pay | Admitting: *Deleted

## 2020-09-08 NOTE — Telephone Encounter (Signed)
Michele Maldonado left a voicemail this am stating she has left messages in MyChart for someone to explain her antibody results and about her broken vein.  She states she had a dramatic c/s last time same thing. Reviewed with Dr. Donavan Foil and I called Michele Maldonado and reached her voicemail; I left a message I was calling her back to discuss her MyChart messages and voice message and since I did not reach her will reply to her MyChart messages with detailed message- call us if still has questions. Chamika Cunanan,RN

## 2020-09-15 ENCOUNTER — Ambulatory Visit: Payer: Medicaid Other | Attending: Obstetrics and Gynecology | Admitting: *Deleted

## 2020-09-15 ENCOUNTER — Other Ambulatory Visit: Payer: Self-pay | Admitting: *Deleted

## 2020-09-15 ENCOUNTER — Ambulatory Visit (HOSPITAL_BASED_OUTPATIENT_CLINIC_OR_DEPARTMENT_OTHER): Payer: Medicaid Other

## 2020-09-15 ENCOUNTER — Encounter: Payer: Self-pay | Admitting: *Deleted

## 2020-09-15 ENCOUNTER — Other Ambulatory Visit: Payer: Self-pay

## 2020-09-15 ENCOUNTER — Other Ambulatory Visit: Payer: Self-pay | Admitting: Obstetrics and Gynecology

## 2020-09-15 DIAGNOSIS — O09292 Supervision of pregnancy with other poor reproductive or obstetric history, second trimester: Secondary | ICD-10-CM | POA: Insufficient documentation

## 2020-09-15 DIAGNOSIS — Z419 Encounter for procedure for purposes other than remedying health state, unspecified: Secondary | ICD-10-CM | POA: Diagnosis not present

## 2020-09-15 DIAGNOSIS — D696 Thrombocytopenia, unspecified: Secondary | ICD-10-CM | POA: Insufficient documentation

## 2020-09-15 DIAGNOSIS — O30042 Twin pregnancy, dichorionic/diamniotic, second trimester: Secondary | ICD-10-CM | POA: Insufficient documentation

## 2020-09-15 DIAGNOSIS — O34219 Maternal care for unspecified type scar from previous cesarean delivery: Secondary | ICD-10-CM | POA: Insufficient documentation

## 2020-09-15 DIAGNOSIS — O0942 Supervision of pregnancy with grand multiparity, second trimester: Secondary | ICD-10-CM | POA: Insufficient documentation

## 2020-09-15 DIAGNOSIS — Z362 Encounter for other antenatal screening follow-up: Secondary | ICD-10-CM | POA: Diagnosis not present

## 2020-09-15 DIAGNOSIS — O99412 Diseases of the circulatory system complicating pregnancy, second trimester: Secondary | ICD-10-CM | POA: Insufficient documentation

## 2020-09-15 DIAGNOSIS — O0990 Supervision of high risk pregnancy, unspecified, unspecified trimester: Secondary | ICD-10-CM

## 2020-09-15 DIAGNOSIS — O99112 Other diseases of the blood and blood-forming organs and certain disorders involving the immune mechanism complicating pregnancy, second trimester: Secondary | ICD-10-CM | POA: Insufficient documentation

## 2020-09-15 DIAGNOSIS — I4891 Unspecified atrial fibrillation: Secondary | ICD-10-CM | POA: Insufficient documentation

## 2020-09-15 DIAGNOSIS — Z3A25 25 weeks gestation of pregnancy: Secondary | ICD-10-CM

## 2020-09-17 ENCOUNTER — Encounter: Payer: Medicaid Other | Admitting: Obstetrics & Gynecology

## 2020-09-17 ENCOUNTER — Ambulatory Visit: Payer: Medicaid Other | Admitting: Physician Assistant

## 2020-09-17 ENCOUNTER — Ambulatory Visit: Payer: Medicaid Other | Admitting: Internal Medicine

## 2020-09-18 NOTE — Progress Notes (Signed)
Cardiology Office Note Date:  09/18/2020  Patient ID:  Michele Maldonado [birth year], MRN [id number] PCP:  Patient, No Pcp Per  Electrophysiologist: Dr. Caryl Comes    Chief Complaint:  planned f/u  History of Present Illness: Michele Maldonado is a 34 y.o. female with history of spontaneously closing VSD  first child at 55-year-old son was complicated by "blood thinners and seizure medications "also found to have nephrotic syndrome and underwent C-section at 34 weeks.   Her second child was delivered early at 31-1/2 weeks because of fetal bradycardia during umbilical cord sampling.   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 Caryl Comes. First consulted 05/14/20 after/during an ER visit with palpitations, at which time she was found in rate controlled AFib and pregnant with twins. Her Ddimer was high and planned for CT scan to r/o   Unclear why, though CT was never done  She saw A. Tillery, PA 07/28/20, she was feeling well, mild SOB with stairs, brief 3-5 minute palpitations 1-2 times a week, usually when overheated.  Due date 12/29/20. Seems he had discussion with Dr Caryl Comes regarding her elevated D dimer, "Usual time to resolution of PE > 68% at 3 month mark. Will get DVT US as 3 month resolution of this would be much lower, and unsure why she would be having AF without trigger". Planned for [place] and 6-8 week follow up US was negative for DVT.  She has followed with OB, last seen 09/03/20, doing well.  TODAY She is feeling quite well.  Following with OB regularly and reports pregnancy progressing well, not planned to let her get all the way to her due date. She denies any CP, palpitations or cardiac awareness She says she was well aware of the Afib, and would know if she had more and says she has not had any. She says that at the time she had AFib she was working at [employer]  was very hot environment with strong odors and thinks that is what provoked the episode. Since quitting she has not had any further. She  Has noted as she gets bigger and further along a little winded with heavier exertion, no rest SOB, no symptoms of PND or orthopnea. No near syncope or syncope. She reports that she fees really well and asks if she needs to continue follow up here     Past Medical History:  Diagnosis Date  . Anemia   . Atrial fibrillation ([place])   . Kell isoimmunization during pregnancy    a. per prior notes - h/o anti Kell alloimmunization.  . MVP (mitral valve prolapse)   . Thrombocytopenia ([place])   . Ventricular septal defect     Past Surgical History:  Procedure Laterality Date  . CESAREAN SECTION N/A 12/29/2013   Procedure: CESAREAN SECTION;  Surgeon: Emily Filbert, MD;  Location: [facility];  Service: Obstetrics;  Laterality: N/A;  . REPAIR VAGINAL CUFF N/A 12/30/2013   Procedure: REPAIR VAGINAL CUFF;  Surgeon: Emily Filbert, MD;  Location: [facility];  Service: Gynecology;  Laterality: N/A;  Insertion of Bakri Balloon    Current Outpatient Medications  Medication Sig Dispense Refill  . Blood Pressure Monitoring (BLOOD PRESSURE KIT) DEVI 1 Device by Does not apply route as needed. (Patient not taking: Reported on 09/03/2020) 1 each 0  . prenatal vitamin w/FE, FA (PRENATAL 1 + 1) 27-1 MG TABS  tablet Take 1 tablet by mouth daily at 12 noon. 30 tablet 6   No current facility-administered medications for this visit.    Allergies:   Patient has no known allergies.   Social History:  The patient  reports that she has never smoked. She has never used smokeless tobacco. She reports previous alcohol use. She reports that she does not use drugs.   Family History:  The patient's family history includes Atrial fibrillation in her maternal grandfather; Diabetes in her maternal grandmother; Hypertension in her maternal grandmother; Thyroid disease in her mother.  ROS:  Please see  the history of present illness.    All other systems are reviewed and otherwise negative.   PHYSICAL EXAM:  VS:  LMP 03/24/2020 (Approximate)  BMI: There is no height or weight on file to calculate BMI. Well nourished, well developed, in no acute distress HEENT: normocephalic, atraumatic Neck: no JVD, carotid bruits or masses Cardiac:  RRR; no significant murmurs, no rubs, or gallops Lungs:  CTA b/l, no wheezing, rhonchi or rales Abd: gravid MS: no deformity or atrophy Ext: no edema Skin: warm and dry, no rash Neuro:  No gross deficits appreciated Psych: euthymic mood, full affect    EKG:  Not done today 05/14/20 SR 63bpm  08/07/2020: LE venus [place] Summary:  BILATERAL:  - No evidence of deep vein thrombosis seen in the lower extremities,  bilaterally.  - No evidence of superficial venous thrombosis in the lower extremities,  bilaterally.  -No evidence of popliteal cyst, bilaterally.     06/16/2020: TTE IMPRESSIONS  1. Left ventricular ejection fraction, by estimation, is 55 to 60%. The  left ventricle has normal function. The left ventricle has no regional  wall motion abnormalities. Left ventricular diastolic parameters were  normal. No residual VSD noted.  2. Right ventricular systolic function is normal. The right ventricular  size is normal. There is normal pulmonary artery systolic pressure. The  estimated right ventricular systolic pressure is 47.6 mmHg.  3. The mitral valve is normal in structure. No evidence of mitral valve  regurgitation. No evidence of mitral stenosis.  4. The aortic valve is tricuspid. Aortic valve regurgitation is not  visualized. No aortic stenosis is present.  5. The inferior vena cava is normal in size with <50% respiratory  variability, suggesting right atrial pressure of 8 mmHg.   Recent Labs: 05/09/2020: BUN 7; Creatinine, Ser 0.55; Magnesium 1.9; Potassium 3.9; Sodium 134; TSH 0.415 06/25/2020: Hemoglobin 11.4; Platelets 178  No  results found for requested labs within last 8760 hours.   CrCl cannot be calculated (Patient's most recent lab result is older than the maximum 21 days allowed.).   Wt Readings from Last 3 Encounters:  09/03/20 196 lb 14.4 oz (89.3 kg)  07/31/20 183 lb (83 kg)  07/28/20 183 lb (83 kg)     Other studies reviewed: Additional studies/records reviewed today include: summarized above  ASSESSMENT AND PLAN:  1. Paroxysmal AFib     CHA2Ds2Vasc is one for gender     Not on a/c     No recurrent palpitations I will reach out to Dr. Caryl Comes for his recommendations on follow up.  For now, will have her scheduled to see him in January    Disposition: as above  Current medicines are reviewed at length with the patient today.  The patient did not have any concerns regarding medicines.  Venetia Night, PA-C 09/18/2020 5:34 PM     [address]  [address] [place] [address] [phone] (office)  [phone] (fax)

## 2020-09-19 ENCOUNTER — Ambulatory Visit: Payer: Medicaid Other

## 2020-09-19 ENCOUNTER — Ambulatory Visit (INDEPENDENT_AMBULATORY_CARE_PROVIDER_SITE_OTHER): Payer: Medicaid Other | Admitting: Physician Assistant

## 2020-09-19 ENCOUNTER — Encounter: Payer: Self-pay | Admitting: Physician Assistant

## 2020-09-19 ENCOUNTER — Other Ambulatory Visit: Payer: Self-pay

## 2020-09-19 VITALS — BP 120/70 | HR 78 | Ht 68.0 in | Wt 203.6 lb

## 2020-09-19 DIAGNOSIS — I480 Paroxysmal atrial fibrillation: Secondary | ICD-10-CM

## 2020-09-19 NOTE — Patient Instructions (Signed)
Medication Instructions:   Your physician recommends that you continue on your current medications as directed. Please refer to the Current Medication list given to you today.  *If you need a refill on your cardiac medications before your next appointment, please call your pharmacy*   Lab Work: NONE ORDERED  TODAY   If you have labs (blood work) drawn today and your tests are completely normal, you will receive your results only by:  MyChart Message (if you have MyChart) OR  A paper copy in the mail If you have any lab test that is abnormal or we need to change your treatment, we will call you to review the results.   Testing/Procedures: NONE ORDERED  TODAY    Follow-Up: At [facility], you and your health needs are our priority.  As part of our continuing mission to provide you with exceptional heart care, we have created designated Provider Care Teams.  These Care Teams include your primary Cardiologist (physician) and Advanced Practice Providers (APPs -  Physician Assistants and Nurse Practitioners) who all work together to provide you with the care you need, when you need it.  We recommend signing up for the patient portal called "MyChart".  Sign up information is provided on this After Visit Summary.  MyChart is used to connect with patients for Virtual Visits (Telemedicine).  Patients are able to view lab/test results, encounter notes, upcoming appointments, etc.  Non-urgent messages can be sent to your provider as well.   To learn more about what you can do with MyChart, go to [email or web address].    Your next appointment:   2 month(s)  The format for your next appointment:   In Person  Provider:   Sherryl Manges, MD  ONLY     Other Instructions

## 2020-10-01 ENCOUNTER — Other Ambulatory Visit: Payer: Self-pay

## 2020-10-01 ENCOUNTER — Encounter: Payer: Medicaid Other | Admitting: Obstetrics and Gynecology

## 2020-10-01 ENCOUNTER — Ambulatory Visit (INDEPENDENT_AMBULATORY_CARE_PROVIDER_SITE_OTHER): Payer: Medicaid Other | Admitting: Obstetrics and Gynecology

## 2020-10-01 VITALS — BP 124/62 | HR 89 | Wt 204.0 lb

## 2020-10-01 DIAGNOSIS — O361920 Maternal care for other isoimmunization, second trimester, not applicable or unspecified: Secondary | ICD-10-CM

## 2020-10-01 DIAGNOSIS — O30042 Twin pregnancy, dichorionic/diamniotic, second trimester: Secondary | ICD-10-CM

## 2020-10-01 DIAGNOSIS — Z3A27 27 weeks gestation of pregnancy: Secondary | ICD-10-CM

## 2020-10-01 DIAGNOSIS — Z87441 Personal history of nephrotic syndrome: Secondary | ICD-10-CM

## 2020-10-01 DIAGNOSIS — O0990 Supervision of high risk pregnancy, unspecified, unspecified trimester: Secondary | ICD-10-CM

## 2020-10-01 DIAGNOSIS — Z98891 History of uterine scar from previous surgery: Secondary | ICD-10-CM

## 2020-10-01 DIAGNOSIS — Z862 Personal history of diseases of the blood and blood-forming organs and certain disorders involving the immune mechanism: Secondary | ICD-10-CM

## 2020-10-01 DIAGNOSIS — I341 Nonrheumatic mitral (valve) prolapse: Secondary | ICD-10-CM

## 2020-10-01 NOTE — Patient Instructions (Addendum)
National Guideline Alliance ([place]).Twin and Triplet Pregnancy. [place]: National Institute for Health and Care Excellence ([place]); 2019.">  Multiple Pregnancy Multiple pregnancy means that a woman is carrying more than one baby at a time. She may be pregnant with twins, triplets, or more. The majority of multiple pregnancies are twins. Naturally conceiving triplets or more (higher-order multiples) is rare. Multiple pregnancies are riskier than single pregnancies. A woman with a multiple pregnancy is more likely to have certain problems during her pregnancy. How does a multiple pregnancy happen? A multiple pregnancy happens when:  The woman's body releases more than one egg at a time, and then each egg gets fertilized by a different sperm. ? This is the most common type of multiple pregnancy. ? Twins or other multiples produced this way are called fraternal. They are no more alike than non-multiple siblings are.  One sperm fertilizes one egg, which then divides into more than one embryo. ? Twins or other multiples produced this way are called identical. Identical multiples are always the same gender, and they look very much alike. Who is most likely to have a multiple pregnancy? A multiple pregnancy is more likely to develop in women who:  Have had fertility treatment, especially if the treatment included fertility medicines.  Are older than 32 years of age.  Have already had four or more children.  Have a family history of multiple pregnancy. How is a multiple pregnancy diagnosed? A multiple pregnancy may be diagnosed based on:  Symptoms such as: ? Rapid weight gain in the first 3 months of pregnancy (first trimester). ? More severe nausea and breast tenderness than what is typical of a single pregnancy. ? A larger uterus than what is normal for the stage of the pregnancy.  Blood tests that detect a higher-than-normal level of human chorionic gonadotropin (hCG). This is a hormone that your  body produces in early pregnancy.  An ultrasound exam. This is used to confirm that you are carrying multiples. What risks come with multiple pregnancy? A multiple pregnancy puts you at a higher risk for certain problems during or after your pregnancy. These include:  Delivering your babies before your due date (preterm birth). A full-term pregnancy lasts for at least 37 weeks. ? Babies born before 37 weeks may have a higher risk for breathing problems, feeding difficulties, cerebral palsy, and learning disabilities.  Diabetes.  Preeclampsia. This is a serious condition that causes high blood pressure and headaches during pregnancy.  Too much blood loss after childbirth (postpartum hemorrhage).  Postpartum depression.  Low birth weight of the babies. How will having a multiple pregnancy affect my care? Your health care team will monitor you more closely. You may need more frequent prenatal visits. This will ensure that you are healthy and that your babies are growing normally. Follow these instructions at home: Eating and drinking  Increase your nutrition. ? Follow your health care provider's recommendations for weight gain. You may need to gain a little extra weight when you are pregnant with multiples. ? Eat healthy snacks often throughout the day. This will add calories and reduce nausea.  Drink enough fluid to keep your urine pale yellow.  Take prenatal vitamins. Ask your health care provider what vitamins are right for you. Activity Limit your activities by 20-24 weeks of pregnancy.  Rest often.  Avoid activities, exercise, and work that take a lot of effort.  Ask your health care provider when you should stop having sex. General instructions  Do not use any   products that contain nicotine or tobacco, such as cigarettes, e-cigarettes, and chewing tobacco. If you need help quitting, ask your health care provider.  Do not drink alcohol or use illegal drugs.  Take  over-the-counter and prescription medicines only as told by your health care provider.  Arrange for extra help around the house.  Keep all follow-up visits and all prenatal visits as told by your health care provider. This is important. Where to find more information  [employer]: www.acog.org Contact a health care provider if:  You have dizziness.  You have nausea, vomiting, or diarrhea that does not go away.  You have depression or other emotions that are interfering with your normal activities.  You have a fever.  You have pain with urination.  You have a bad-smelling vaginal discharge.  You notice increased swelling in your face, hands, legs, or ankles. Get help right away if:  You have fluid leaking from your vagina.  You have bleeding from your vagina.  You have pelvic cramps, pelvic pressure, or nagging pain in your abdomen or lower back.  You are having regular contractions.  You have a severe headache, with or without changes in how you see.  You have chest pain or shortness of breath.  You notice that your babies move less often, or do not move at all. Summary  Having a multiple pregnancy means that a woman is carrying more than one baby at a time.  A multiple pregnancy puts you at a higher risk for delivering your babies before your due date, having diabetes, preeclampsia, too much blood loss after childbirth, or low birth weight of the babies.  Your health care provider will monitor you more closely during your pregnancy.  You may need to make some lifestyle changes during pregnancy. This includes eating more, limiting your activities after 20-24 weeks of pregnancy, and arranging for extra help around the house.  Follow up with your health care provider as instructed if you experience any complications. This information is not intended to replace advice given to you by your health care provider. Make sure you discuss  any questions you have with your health care provider. Document Revised: 06/25/2019 Document Reviewed: 06/25/2019 Elsevier Patient Education  2020 [employer]. Levonorgestrel intrauterine device (IUD) What is this medicine? LEVONORGESTREL IUD (LEE voe nor jes trel) is a contraceptive (birth control) device. The device is placed inside the uterus by a healthcare professional. It is used to prevent pregnancy. This device can also be used to treat heavy bleeding that occurs during your period. This medicine may be used for other purposes; ask your health care provider or pharmacist if you have questions. COMMON BRAND NAME(S): Cameron Ali What should I tell my health care provider before I take this medicine? They need to know if you have any of these conditions:  abnormal Pap smear  cancer of the breast, uterus, or cervix  diabetes  endometritis  genital or pelvic infection now or in the past  have more than one sexual partner or your partner has more than one partner  heart disease  history of an ectopic or tubal pregnancy  immune system problems  IUD in place  liver disease or tumor  problems with blood clots or take blood-thinners  seizures  use intravenous drugs  uterus of unusual shape  vaginal bleeding that has not been explained  an unusual or allergic reaction to levonorgestrel, other hormones, silicone, or polyethylene, medicines, foods, dyes, or  preservatives  pregnant or trying to get pregnant  breast-feeding How should I use this medicine? This device is placed inside the uterus by a health care professional. Talk to your pediatrician regarding the use of this medicine in children. Special care may be needed. Overdosage: If you think you have taken too much of this medicine contact a poison control center or emergency room at once. NOTE: This medicine is only for you. Do not share this medicine with others. What if I miss a  dose? This does not apply. Depending on the brand of device you have inserted, the device will need to be replaced every 3 to 6 years if you wish to continue using this type of birth control. What may interact with this medicine? Do not take this medicine with any of the following medications:  amprenavir  bosentan  fosamprenavir This medicine may also interact with the following medications:  aprepitant  armodafinil  barbiturate medicines for inducing sleep or treating seizures  bexarotene  boceprevir  griseofulvin  medicines to treat seizures like carbamazepine, ethotoin, felbamate, oxcarbazepine, phenytoin, topiramate  modafinil  pioglitazone  rifabutin  rifampin  rifapentine  some medicines to treat HIV infection like atazanavir, efavirenz, indinavir, lopinavir, nelfinavir, tipranavir, ritonavir  St. John's wort  warfarin This list may not describe all possible interactions. Give your health care provider a list of all the medicines, herbs, non-prescription drugs, or dietary supplements you use. Also tell them if you smoke, drink alcohol, or use illegal drugs. Some items may interact with your medicine. What should I watch for while using this medicine? Visit your doctor or health care professional for regular check ups. See your doctor if you or your partner has sexual contact with others, becomes HIV positive, or gets a sexual transmitted disease. This product does not protect you against HIV infection (AIDS) or other sexually transmitted diseases. You can check the placement of the IUD yourself by reaching up to the top of your vagina with clean fingers to feel the threads. Do not pull on the threads. It is a good habit to check placement after each menstrual period. Call your doctor right away if you feel more of the IUD than just the threads or if you cannot feel the threads at all. The IUD may come out by itself. You may become pregnant if the device comes out.  If you notice that the IUD has come out use a backup birth control method like condoms and call your health care provider. Using tampons will not change the position of the IUD and are okay to use during your period. This IUD can be safely scanned with magnetic resonance imaging (MRI) only under specific conditions. Before you have an MRI, tell your healthcare provider that you have an IUD in place, and which type of IUD you have in place. What side effects may I notice from receiving this medicine? Side effects that you should report to your doctor or health care professional as soon as possible:  allergic reactions like skin rash, itching or hives, swelling of the face, lips, or tongue  fever, flu-like symptoms  genital sores  high blood pressure  no menstrual period for 6 weeks during use  pain, swelling, warmth in the leg  pelvic pain or tenderness  severe or sudden headache  signs of pregnancy  stomach cramping  sudden shortness of breath  trouble with balance, talking, or walking  unusual vaginal bleeding, discharge  yellowing of the eyes or skin Side effects  that usually do not require medical attention (report to your doctor or health care professional if they continue or are bothersome):  acne  breast pain  change in sex drive or performance  changes in weight  cramping, dizziness, or faintness while the device is being inserted  headache  irregular menstrual bleeding within first 3 to 6 months of use  nausea This list may not describe all possible side effects. Call your doctor for medical advice about side effects. You may report side effects to FDA at 1-800-FDA-1088. Where should I keep my medicine? This does not apply. NOTE: This sheet is a summary. It may not cover all possible information. If you have questions about this medicine, talk to your doctor, pharmacist, or health care provider.  2020 Elsevier/Gold Standard (2018-09-12 13:22:01)

## 2020-10-01 NOTE — Progress Notes (Signed)
   PRENATAL VISIT NOTE  Subjective:  Michele Maldonado is a 34 y.o. [id number] at [redacted]w[redacted]d being seen today for ongoing prenatal care.  She is currently monitored for the following issues for this high-risk pregnancy and has Hx of thrombocytopenia; History of 2 cesarean sections; History of nephrotic syndrome; Mitral valve prolapse; Kell isoimmunization in pregnancy; Death of child; A-fib [facility]); Supervision of high risk pregnancy, antepartum; Twin pregnancy; and [redacted] weeks gestation of pregnancy on their problem list.  Patient doing well with no acute concerns today. She reports no complaints.  Contractions: Not present. Vag. Bleeding: None.  Movement: Present. Denies leaking of fluid.   The following portions of the patient's history were reviewed and updated as appropriate: allergies, current medications, past family history, past medical history, past social history, past surgical history and problem list. Problem list updated.  Objective:   Vitals:   10/01/20 0842  BP: 124/62  Pulse: 89  Weight: 204 lb (92.5 kg)    Fetal Status: Fetal Heart Rate (bpm): 130/144 Fundal Height: 30 cm Movement: Present     General:  Alert, oriented and cooperative. Patient is in no acute distress.  Skin: Skin is warm and dry. No rash noted.   Cardiovascular: Normal heart rate noted  Respiratory: Normal respiratory effort, no problems with respiration noted  Abdomen: Soft, gravid, appropriate for gestational age.  Pain/Pressure: Absent     Pelvic: Cervical exam deferred        Extremities: Normal range of motion.  Edema: None  Mental Status:  Normal mood and affect. Normal behavior. Normal judgment and thought content.   Assessment and Plan:  Pregnancy: [id number] at [redacted]w[redacted]d  1. Supervision of high risk pregnancy, antepartum 28 week labs today - CBC - Glucose Tolerance, 2 Hours w/1 Hour - HIV Antibody (routine testing w rflx) - RPR  2. Hx of thrombocytopenia Last check platelets were normal  3. History of  2 cesarean sections Discussed prior talk with Dr. Shawnie Pons.  If pt has spontaneous labor TOLAC can be considered, especially if baby A is vertex.  No IOL per previous note 4. History of nephrotic syndrome Kidney function currently normal  5. Kell isoimmunization during pregnancy in second trimester, single or unspecified fetus Weak kell noted, continued dopplers per MFM schedule  6. Dichorionic diamniotic twin pregnancy in second trimester 22% discordance, next scan on 11/29  7. Mitral valve prolapse Per electrophysiology note, normal cardiac function from echo, no mitral valve regurg or stenosis  Preterm labor symptoms and general obstetric precautions including but not limited to vaginal bleeding, contractions, leaking of fluid and fetal movement were reviewed in detail with the patient.  Please refer to After Visit Summary for other counseling recommendations.   Return in about 2 weeks (around 10/15/2020) for [facility], in person.   Mariel Aloe, MD

## 2020-10-02 LAB — GLUCOSE TOLERANCE, 2 HOURS W/ 1HR
Glucose, 1 hour: 67 mg/dL (ref 65–179)
Glucose, 2 hour: 98 mg/dL (ref 65–152)
Glucose, Fasting: 74 mg/dL (ref 65–91)

## 2020-10-02 LAB — RPR: RPR Ser Ql: NONREACTIVE

## 2020-10-02 LAB — CBC
Hematocrit: 33.4 % — ABNORMAL LOW (ref 34.0–46.6)
Hemoglobin: 11.5 g/dL (ref 11.1–15.9)
MCH: 31.1 pg (ref 26.6–33.0)
MCHC: 34.4 g/dL (ref 31.5–35.7)
MCV: 90 fL (ref 79–97)
Platelets: 144 10*3/uL — ABNORMAL LOW (ref 150–450)
RBC: 3.7 x10E6/uL — ABNORMAL LOW (ref 3.77–5.28)
RDW: 12.2 % (ref 11.7–15.4)
WBC: 9.9 10*3/uL (ref 3.4–10.8)

## 2020-10-02 LAB — HIV ANTIBODY (ROUTINE TESTING W REFLEX): HIV Screen 4th Generation wRfx: NONREACTIVE

## 2020-10-13 ENCOUNTER — Other Ambulatory Visit: Payer: Self-pay | Admitting: *Deleted

## 2020-10-13 ENCOUNTER — Ambulatory Visit: Payer: Medicaid Other | Admitting: *Deleted

## 2020-10-13 ENCOUNTER — Ambulatory Visit: Payer: Medicaid Other | Attending: Obstetrics and Gynecology

## 2020-10-13 ENCOUNTER — Other Ambulatory Visit: Payer: Self-pay

## 2020-10-13 ENCOUNTER — Encounter: Payer: Self-pay | Admitting: *Deleted

## 2020-10-13 DIAGNOSIS — Z3A29 29 weeks gestation of pregnancy: Secondary | ICD-10-CM

## 2020-10-13 DIAGNOSIS — O30042 Twin pregnancy, dichorionic/diamniotic, second trimester: Secondary | ICD-10-CM | POA: Insufficient documentation

## 2020-10-13 DIAGNOSIS — O0990 Supervision of high risk pregnancy, unspecified, unspecified trimester: Secondary | ICD-10-CM | POA: Diagnosis not present

## 2020-10-13 DIAGNOSIS — O361930 Maternal care for other isoimmunization, third trimester, not applicable or unspecified: Secondary | ICD-10-CM

## 2020-10-13 DIAGNOSIS — O322XX1 Maternal care for transverse and oblique lie, fetus 1: Secondary | ICD-10-CM | POA: Diagnosis not present

## 2020-10-13 DIAGNOSIS — O09293 Supervision of pregnancy with other poor reproductive or obstetric history, third trimester: Secondary | ICD-10-CM

## 2020-10-13 DIAGNOSIS — O34219 Maternal care for unspecified type scar from previous cesarean delivery: Secondary | ICD-10-CM | POA: Diagnosis not present

## 2020-10-13 DIAGNOSIS — O322XX2 Maternal care for transverse and oblique lie, fetus 2: Secondary | ICD-10-CM | POA: Diagnosis not present

## 2020-10-13 DIAGNOSIS — Z362 Encounter for other antenatal screening follow-up: Secondary | ICD-10-CM | POA: Diagnosis not present

## 2020-10-13 DIAGNOSIS — O30043 Twin pregnancy, dichorionic/diamniotic, third trimester: Secondary | ICD-10-CM

## 2020-10-15 ENCOUNTER — Other Ambulatory Visit: Payer: Self-pay

## 2020-10-15 ENCOUNTER — Ambulatory Visit (INDEPENDENT_AMBULATORY_CARE_PROVIDER_SITE_OTHER): Payer: Medicaid Other | Admitting: Family Medicine

## 2020-10-15 ENCOUNTER — Encounter: Payer: Self-pay | Admitting: Family Medicine

## 2020-10-15 VITALS — BP 114/71 | HR 99 | Wt 213.2 lb

## 2020-10-15 DIAGNOSIS — O0990 Supervision of high risk pregnancy, unspecified, unspecified trimester: Secondary | ICD-10-CM

## 2020-10-15 DIAGNOSIS — Z98891 History of uterine scar from previous surgery: Secondary | ICD-10-CM

## 2020-10-15 DIAGNOSIS — I341 Nonrheumatic mitral (valve) prolapse: Secondary | ICD-10-CM

## 2020-10-15 DIAGNOSIS — Z419 Encounter for procedure for purposes other than remedying health state, unspecified: Secondary | ICD-10-CM | POA: Diagnosis not present

## 2020-10-15 DIAGNOSIS — Z87441 Personal history of nephrotic syndrome: Secondary | ICD-10-CM

## 2020-10-15 DIAGNOSIS — O30043 Twin pregnancy, dichorionic/diamniotic, third trimester: Secondary | ICD-10-CM

## 2020-10-15 DIAGNOSIS — O361930 Maternal care for other isoimmunization, third trimester, not applicable or unspecified: Secondary | ICD-10-CM

## 2020-10-15 DIAGNOSIS — I480 Paroxysmal atrial fibrillation: Secondary | ICD-10-CM

## 2020-10-15 DIAGNOSIS — Z862 Personal history of diseases of the blood and blood-forming organs and certain disorders involving the immune mechanism: Secondary | ICD-10-CM

## 2020-10-15 NOTE — Progress Notes (Signed)
   Subjective:  Michele Maldonado is a 34 y.o. [id number] at [redacted]w[redacted]d being seen today for ongoing prenatal care.  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  Patient reports mild discharge.  Contractions: Irritability. Vag. Bleeding: None.  Movement: Present. Denies leaking of fluid.   The following portions of the patient's history were reviewed and updated as appropriate: allergies, current medications, past family history, past medical history, past social history, past surgical history and problem list. Problem list updated.  Objective:   Vitals:   10/15/20 0837  BP: 114/71  Pulse: 99  Weight: 213 lb 3.2 oz (96.7 kg)    Fetal Status: Fetal Heart Rate (bpm): 145/133   Movement: Present     General:  Alert, oriented and cooperative. Patient is in no acute distress.  Skin: Skin is warm and dry. No rash noted.   Cardiovascular: Normal heart rate noted  Respiratory: Normal respiratory effort, no problems with respiration noted  Abdomen: Soft, gravid, appropriate for gestational age. Pain/Pressure: Present     Pelvic: Vag. Bleeding: None Vag D/C Character: Mucous   Cervical exam deferred        Extremities: Normal range of motion.  Edema: Trace  Mental Status: Normal mood and affect. Normal behavior. Normal judgment and thought content.   Urinalysis:      Assessment and Plan:  Pregnancy: [name] at [redacted]w[redacted]d  1. Supervision of high risk pregnancy, antepartum BP and FHR normal Discharge seems physiologic Discussed contraception, would like hormonal IUD Briefly discussed twin delivery, possible breech extraction, will need further discussion and  counseling  2. Dichorionic diamniotic twin pregnancy in third trimester Following w MFM, last [place] 10/13/2020, Twin A 96% and twin B 50%, discordancy 18%, MCA dopplers normal for both babies  3. Kell isoimmunization during pregnancy in third trimester, single or unspecified fetus Following w MFM, weekly MCA dopplers, most recent US normal   4. History of nephrotic syndrome Baseline P:C normal  5. History of 2 cesarean sections Desires TOLAC if natural labor  6. Hx of thrombocytopenia Platelets 144 on 10/01/2020, will recheck at next visit  7. Mitral valve prolapse Normal TTE on 06/16/2020  8. Paroxysmal atrial fibrillation (HCC) Follows w cardiology, has appt with EP on 11/28/2020  Preterm labor symptoms and general obstetric precautions including but not limited to vaginal bleeding, contractions, leaking of fluid and fetal movement were reviewed in detail with the patient. Please refer to After Visit Summary for other counseling recommendations.  Return in 2 weeks (on 10/29/2020) for [facility], needs MD.   Michele Maples, MD

## 2020-10-15 NOTE — Patient Instructions (Signed)
 Third Trimester of Pregnancy The third trimester is from week 28 through week 40 (months 7 through 9). The third trimester is a time when the unborn baby (fetus) is growing rapidly. At the end of the ninth month, the fetus is about 20 inches in length and weighs 6-10 pounds. Body changes during your third trimester Your body will continue to go through many changes during pregnancy. The changes vary from woman to woman. During the third trimester:  Your weight will continue to increase. You can expect to gain 25-35 pounds (11-16 kg) by the end of the pregnancy.  You may begin to get stretch marks on your hips, abdomen, and breasts.  You may urinate more often because the fetus is moving lower into your pelvis and pressing on your bladder.  You may develop or continue to have heartburn. This is caused by increased hormones that slow down muscles in the digestive tract.  You may develop or continue to have constipation because increased hormones slow digestion and cause the muscles that push waste through your intestines to relax.  You may develop hemorrhoids. These are swollen veins (varicose veins) in the rectum that can itch or be painful.  You may develop swollen, bulging veins (varicose veins) in your legs.  You may have increased body aches in the pelvis, back, or thighs. This is due to weight gain and increased hormones that are relaxing your joints.  You may have changes in your hair. These can include thickening of your hair, rapid growth, and changes in texture. Some women also have hair loss during or after pregnancy, or hair that feels dry or thin. Your hair will most likely return to normal after your baby is born.  Your breasts will continue to grow and they will continue to become tender. A yellow fluid (colostrum) may leak from your breasts. This is the first milk you are producing for your baby.  Your belly button may stick out.  You may notice more swelling in your  hands, face, or ankles.  You may have increased tingling or numbness in your hands, arms, and legs. The skin on your belly may also feel numb.  You may feel short of breath because of your expanding uterus.  You may have more problems sleeping. This can be caused by the size of your belly, increased need to urinate, and an increase in your body's metabolism.  You may notice the fetus "dropping," or moving lower in your abdomen (lightening).  You may have increased vaginal discharge.  You may notice your joints feel loose and you may have pain around your pelvic bone. What to expect at prenatal visits You will have prenatal exams every 2 weeks until week 36. Then you will have weekly prenatal exams. During a routine prenatal visit:  You will be weighed to make sure you and the baby are growing normally.  Your blood pressure will be taken.  Your abdomen will be measured to track your baby's growth.  The fetal heartbeat will be listened to.  Any test results from the previous visit will be discussed.  You may have a cervical check near your due date to see if your cervix has softened or thinned (effaced).  You will be tested for Group B streptococcus. This happens between 35 and 37 weeks. Your health care provider may ask you:  What your birth plan is.  How you are feeling.  If you are feeling the baby move.  If you have had any   abnormal symptoms, such as leaking fluid, bleeding, severe headaches, or abdominal cramping.  If you are using any tobacco products, including cigarettes, chewing tobacco, and electronic cigarettes.  If you have any questions. Other tests or screenings that may be performed during your third trimester include:  Blood tests that check for low iron levels (anemia).  Fetal testing to check the health, activity level, and growth of the fetus. Testing is done if you have certain medical conditions or if there are problems during the  pregnancy.  Nonstress test (NST). This test checks the health of your baby to make sure there are no signs of problems, such as the baby not getting enough oxygen. During this test, a belt is placed around your belly. The baby is made to move, and its heart rate is monitored during movement. What is false labor? False labor is a condition in which you feel small, irregular tightenings of the muscles in the womb (contractions) that usually go away with rest, changing position, or drinking water. These are called Braxton Hicks contractions. Contractions may last for hours, days, or even weeks before true labor sets in. If contractions come at regular intervals, become more frequent, increase in intensity, or become painful, you should see your health care provider. What are the signs of labor?  Abdominal cramps.  Regular contractions that start at 10 minutes apart and become stronger and more frequent with time.  Contractions that start on the top of the uterus and spread down to the lower abdomen and back.  Increased pelvic pressure and dull back pain.  A watery or bloody mucus discharge that comes from the vagina.  Leaking of amniotic fluid. This is also known as your "water breaking." It could be a slow trickle or a gush. Let your health care provider know if it has a color or strange odor. If you have any of these signs, call your health care provider right away, even if it is before your due date. Follow these instructions at home: Medicines  Follow your health care provider's instructions regarding medicine use. Specific medicines may be either safe or unsafe to take during pregnancy.  Take a prenatal vitamin that contains at least 600 micrograms (mcg) of folic acid.  If you develop constipation, try taking a stool softener if your health care provider approves. Eating and drinking   Eat a balanced diet that includes fresh fruits and vegetables, whole grains, good sources of protein  such as meat, eggs, or tofu, and low-fat dairy. Your health care provider will help you determine the amount of weight gain that is right for you.  Avoid raw meat and uncooked cheese. These carry germs that can cause birth defects in the baby.  If you have low calcium intake from food, talk to your health care provider about whether you should take a daily calcium supplement.  Eat four or five small meals rather than three large meals a day.  Limit foods that are high in fat and processed sugars, such as fried and sweet foods.  To prevent constipation: ? Drink enough fluid to keep your urine clear or pale yellow. ? Eat foods that are high in fiber, such as fresh fruits and vegetables, whole grains, and beans. Activity  Exercise only as directed by your health care provider. Most women can continue their usual exercise routine during pregnancy. Try to exercise for 30 minutes at least 5 days a week. Stop exercising if you experience uterine contractions.  Avoid heavy lifting.    Do not exercise in extreme heat or humidity, or at high altitudes.  Wear low-heel, comfortable shoes.  Practice good posture.  You may continue to have sex unless your health care provider tells you otherwise. Relieving pain and discomfort  Take frequent breaks and rest with your legs elevated if you have leg cramps or low back pain.  Take warm sitz baths to soothe any pain or discomfort caused by hemorrhoids. Use hemorrhoid cream if your health care provider approves.  Wear a good support bra to prevent discomfort from breast tenderness.  If you develop varicose veins: ? Wear support pantyhose or compression stockings as told by your healthcare provider. ? Elevate your feet for 15 minutes, 3-4 times a day. Prenatal care  Write down your questions. Take them to your prenatal visits.  Keep all your prenatal visits as told by your health care provider. This is important. Safety  Wear your seat belt at  all times when driving.  Make a list of emergency phone numbers, including numbers for family, friends, the hospital, and police and fire departments. General instructions  Avoid cat litter boxes and soil used by cats. These carry germs that can cause birth defects in the baby. If you have a cat, ask someone to clean the litter box for you.  Do not travel far distances unless it is absolutely necessary and only with the approval of your health care provider.  Do not use hot tubs, steam rooms, or saunas.  Do not drink alcohol.  Do not use any products that contain nicotine or tobacco, such as cigarettes and e-cigarettes. If you need help quitting, ask your health care provider.  Do not use any medicinal herbs or unprescribed drugs. These chemicals affect the formation and growth of the baby.  Do not douche or use tampons or scented sanitary pads.  Do not cross your legs for long periods of time.  To prepare for the arrival of your baby: ? Take prenatal classes to understand, practice, and ask questions about labor and delivery. ? Make a trial run to the hospital. ? Visit the hospital and tour the maternity area. ? Arrange for maternity or paternity leave through employers. ? Arrange for family and friends to take care of pets while you are in the hospital. ? Purchase a rear-facing car seat and make sure you know how to install it in your car. ? Pack your hospital bag. ? Prepare the baby's nursery. Make sure to remove all pillows and stuffed animals from the baby's crib to prevent suffocation.  Visit your dentist if you have not gone during your pregnancy. Use a soft toothbrush to brush your teeth and be gentle when you floss. Contact a health care provider if:  You are unsure if you are in labor or if your water has broken.  You become dizzy.  You have mild pelvic cramps, pelvic pressure, or nagging pain in your abdominal area.  You have lower back pain.  You have persistent  nausea, vomiting, or diarrhea.  You have an unusual or bad smelling vaginal discharge.  You have pain when you urinate. Get help right away if:  Your water breaks before 37 weeks.  You have regular contractions less than 5 minutes apart before 37 weeks.  You have a fever.  You are leaking fluid from your vagina.  You have spotting or bleeding from your vagina.  You have severe abdominal pain or cramping.  You have rapid weight loss or weight gain.  You   have shortness of breath with chest pain.  You notice sudden or extreme swelling of your face, hands, ankles, feet, or legs.  Your baby makes fewer than 10 movements in 2 hours.  You have severe headaches that do not go away when you take medicine.  You have vision changes. Summary  The third trimester is from week 28 through week 40, months 7 through 9. The third trimester is a time when the unborn baby (fetus) is growing rapidly.  During the third trimester, your discomfort may increase as you and your baby continue to gain weight. You may have abdominal, leg, and back pain, sleeping problems, and an increased need to urinate.  During the third trimester your breasts will keep growing and they will continue to become tender. A yellow fluid (colostrum) may leak from your breasts. This is the first milk you are producing for your baby.  False labor is a condition in which you feel small, irregular tightenings of the muscles in the womb (contractions) that eventually go away. These are called Braxton Hicks contractions. Contractions may last for hours, days, or even weeks before true labor sets in.  Signs of labor can include: abdominal cramps; regular contractions that start at 10 minutes apart and become stronger and more frequent with time; watery or bloody mucus discharge that comes from the vagina; increased pelvic pressure and dull back pain; and leaking of amniotic fluid. This information is not intended to replace advice  given to you by your health care provider. Make sure you discuss any questions you have with your health care provider. Document Revised: 02/22/2019 Document Reviewed: 12/07/2016 Else[employer] Patient Education  2020 [employer]   Contraception Choices Contraception, also called birth control, refers to methods or devices that prevent pregnancy. Hormonal methods Contraceptive implant  A contraceptive implant is a thin, plastic tube that contains a hormone. It is inserted into the upper part of the arm. It can remain in place for up to 3 years. Progestin-only injections Progestin-only injections are injections of progestin, a synthetic form of the hormone progesterone. They are given every 3 months by a health care provider. Birth control pills  Birth control pills are pills that contain hormones that prevent pregnancy. They must be taken once a day, preferably at the same time each day. Birth control patch  The birth control patch contains hormones that prevent pregnancy. It is placed on the skin and must be changed once a week for three weeks and removed on the fourth week. A prescription is needed to use this method of contraception. Vaginal ring  A vaginal ring contains hormones that prevent pregnancy. It is placed in the vagina for three weeks and removed on the fourth week. After that, the process is repeated with a new ring. A prescription is needed to use this method of contraception. Emergency contraceptive Emergency contraceptives prevent pregnancy after unprotected sex. They come in pill form and can be taken up to 5 days after sex. They work best the sooner they are taken after having sex. Most emergency contraceptives are available without a prescription. This method should not be used as your only form of birth control. Barrier methods Female condom  A female condom is a thin sheath that is worn over the penis during sex. Condoms keep sperm from going inside a woman's body. They can  be used with a spermicide to increase their effectiveness. They should be disposed after a single use. Female condom  A female condom is a soft,   loose-fitting sheath that is put into the vagina before sex. The condom keeps sperm from going inside a woman's body. They should be disposed after a single use. Diaphragm  A diaphragm is a soft, dome-shaped barrier. It is inserted into the vagina before sex, along with a spermicide. The diaphragm blocks sperm from entering the uterus, and the spermicide kills sperm. A diaphragm should be left in the vagina for 6-8 hours after sex and removed within 24 hours. A diaphragm is prescribed and fitted by a health care provider. A diaphragm should be replaced every 1-2 years, after giving birth, after gaining more than 15 lb (6.8 kg), and after pelvic surgery. Cervical cap  A cervical cap is a round, soft latex or plastic cup that fits over the cervix. It is inserted into the vagina before sex, along with spermicide. It blocks sperm from entering the uterus. The cap should be left in place for 6-8 hours after sex and removed within 48 hours. A cervical cap must be prescribed and fitted by a health care provider. It should be replaced every 2 years. Sponge  A sponge is a soft, circular piece of polyurethane foam with spermicide on it. The sponge helps block sperm from entering the uterus, and the spermicide kills sperm. To use it, you make it wet and then insert it into the vagina. It should be inserted before sex, left in for at least 6 hours after sex, and removed and thrown away within 30 hours. Spermicides Spermicides are chemicals that kill or block sperm from entering the cervix and uterus. They can come as a cream, jelly, suppository, foam, or tablet. A spermicide should be inserted into the vagina with an applicator at least 10-15 minutes before sex to allow time for it to work. The process must be repeated every time you have sex. Spermicides do not require  a prescription. Intrauterine contraception Intrauterine device (IUD) An IUD is a T-shaped device that is put in a woman's uterus. There are two types:  Hormone IUD.This type contains progestin, a synthetic form of the hormone progesterone. This type can stay in place for 3-5 years.  Copper IUD.This type is wrapped in copper wire. It can stay in place for 10 years.  Permanent methods of contraception Female tubal ligation In this method, a woman's fallopian tubes are sealed, tied, or blocked during surgery to prevent eggs from traveling to the uterus. Hysteroscopic sterilization In this method, a small, flexible insert is placed into each fallopian tube. The inserts cause scar tissue to form in the fallopian tubes and block them, so sperm cannot reach an egg. The procedure takes about 3 months to be effective. Another form of birth control must be used during those 3 months. Female sterilization This is a procedure to tie off the tubes that carry sperm (vasectomy). After the procedure, the man can still ejaculate fluid (semen). Natural planning methods Natural family planning In this method, a couple does not have sex on days when the woman could become pregnant. Calendar method This means keeping track of the length of each menstrual cycle, identifying the days when pregnancy can happen, and not having sex on those days. Ovulation method In this method, a couple avoids sex during ovulation. Symptothermal method This method involves not having sex during ovulation. The woman typically checks for ovulation by watching changes in her temperature and in the consistency of cervical mucus. Post-ovulation method In this method, a couple waits to have sex until after ovulation. Summary    Contraception, also called birth control, means methods or devices that prevent pregnancy.  Hormonal methods of contraception include implants, injections, pills, patches, vaginal rings, and emergency  contraceptives.  Barrier methods of contraception can include female condoms, female condoms, diaphragms, cervical caps, sponges, and spermicides.  There are two types of IUDs (intrauterine devices). An IUD can be put in a woman's uterus to prevent pregnancy for 3-5 years.  Permanent sterilization can be done through a procedure for males, females, or both.  Natural family planning methods involve not having sex on days when the woman could become pregnant. This information is not intended to replace advice given to you by your health care provider. Make sure you discuss any questions you have with your health care provider. Document Revised: 11/03/2017 Document Reviewed: 12/04/2016 [employer] Patient Education  2020 [employer]   Breastfeeding  Choosing to breastfeed is one of the best decisions you can make for yourself and your baby. A change in hormones during pregnancy causes your breasts to make breast milk in your milk-producing glands. Hormones prevent breast milk from being released before your baby is born. They also prompt milk flow after birth. Once breastfeeding has begun, thoughts of your baby, as well as his or her sucking or crying, can stimulate the release of milk from your milk-producing glands. Benefits of breastfeeding Research shows that breastfeeding offers many health benefits for infants and mothers. It also offers a cost-free and convenient way to feed your baby. For your baby  Your first milk (colostrum) helps your baby's digestive system to function better.  Special cells in your milk (antibodies) help your baby to fight off infections.  Breastfed babies are less likely to develop asthma, allergies, obesity, or type 2 diabetes. They are also at lower risk for sudden infant death syndrome (SIDS).  Nutrients in breast milk are better able to meet your baby's needs compared to infant formula.  Breast milk improves your baby's brain development. For  you  Breastfeeding helps to create a very special bond between you and your baby.  Breastfeeding is convenient. Breast milk costs nothing and is always available at the correct temperature.  Breastfeeding helps to burn calories. It helps you to lose the weight that you gained during pregnancy.  Breastfeeding makes your uterus return faster to its size before pregnancy. It also slows bleeding (lochia) after you give birth.  Breastfeeding helps to lower your risk of developing type 2 diabetes, osteoporosis, rheumatoid arthritis, cardiovascular disease, and breast, ovarian, uterine, and endometrial cancer later in life. Breastfeeding basics Starting breastfeeding  Find a comfortable place to sit or lie down, with your neck and back well-supported.  Place a pillow or a rolled-up blanket under your baby to bring him or her to the level of your breast (if you are seated). Nursing pillows are specially designed to help support your arms and your baby while you breastfeed.  Make sure that your baby's tummy (abdomen) is facing your abdomen.  Gently massage your breast. With your fingertips, massage from the outer edges of your breast inward toward the nipple. This encourages milk flow. If your milk flows slowly, you may need to continue this action during the feeding.  Support your breast with 4 fingers underneath and your thumb above your nipple (make the letter "C" with your hand). Make sure your fingers are well away from your nipple and your baby's mouth.  Stroke your baby's lips gently with your finger or nipple.  When your baby's mouth is open   wide enough, quickly bring your baby to your breast, placing your entire nipple and as much of the areola as possible into your baby's mouth. The areola is the colored area around your nipple. ? More areola should be visible above your baby's upper lip than below the lower lip. ? Your baby's lips should be opened and extended outward (flanged) to  ensure an adequate, comfortable latch. ? Your baby's tongue should be between his or her lower gum and your breast.  Make sure that your baby's mouth is correctly positioned around your nipple (latched). Your baby's lips should create a seal on your breast and be turned out (everted).  It is common for your baby to suck about 2-3 minutes in order to start the flow of breast milk. Latching Teaching your baby how to latch onto your breast properly is very important. An improper latch can cause nipple pain, decreased milk supply, and poor weight gain in your baby. Also, if your baby is not latched onto your nipple properly, he or she may swallow some air during feeding. This can make your baby fussy. Burping your baby when you switch breasts during the feeding can help to get rid of the air. However, teaching your baby to latch on properly is still the best way to prevent fussiness from swallowing air while breastfeeding. Signs that your baby has successfully latched onto your nipple  Silent tugging or silent sucking, without causing you pain. Infant's lips should be extended outward (flanged).  Swallowing heard between every 3-4 sucks once your milk has started to flow (after your let-down milk reflex occurs).  Muscle movement above and in front of his or her ears while sucking. Signs that your baby has not successfully latched onto your nipple  Sucking sounds or smacking sounds from your baby while breastfeeding.  Nipple pain. If you think your baby has not latched on correctly, slip your finger into the corner of your baby's mouth to break the suction and place it between your baby's gums. Attempt to start breastfeeding again. Signs of successful breastfeeding Signs from your baby  Your baby will gradually decrease the number of sucks or will completely stop sucking.  Your baby will fall asleep.  Your baby's body will relax.  Your baby will retain a small amount of milk in his or her  mouth.  Your baby will let go of your breast by himself or herself. Signs from you  Breasts that have increased in firmness, weight, and size 1-3 hours after feeding.  Breasts that are softer immediately after breastfeeding.  Increased milk volume, as well as a change in milk consistency and color by the fifth day of breastfeeding.  Nipples that are not sore, cracked, or bleeding. Signs that your baby is getting enough milk  Wetting at least 1-2 diapers during the first 24 hours after birth.  Wetting at least 5-6 diapers every 24 hours for the first week after birth. The urine should be clear or pale yellow by the age of 5 days.  Wetting 6-8 diapers every 24 hours as your baby continues to grow and develop.  At least 3 stools in a 24-hour period by the age of 5 days. The stool should be soft and yellow.  At least 3 stools in a 24-hour period by the age of 7 days. The stool should be seedy and yellow.  No loss of weight greater than 10% of birth weight during the first 3 days of life.  Average weight gain   of 4-7 oz (113-198 g) per week after the age of 4 days.  Consistent daily weight gain by the age of 5 days, without weight loss after the age of 2 weeks. After a feeding, your baby may spit up a small amount of milk. This is normal. Breastfeeding frequency and duration Frequent feeding will help you make more milk and can prevent sore nipples and extremely full breasts (breast engorgement). Breastfeed when you feel the need to reduce the fullness of your breasts or when your baby shows signs of hunger. This is called "breastfeeding on demand." Signs that your baby is hungry include:  Increased alertness, activity, or restlessness.  Movement of the head from side to side.  Opening of the mouth when the corner of the mouth or cheek is stroked (rooting).  Increased sucking sounds, smacking lips, cooing, sighing, or squeaking.  Hand-to-mouth movements and sucking on fingers or  hands.  Fussing or crying. Avoid introducing a pacifier to your baby in the first 4-6 weeks after your baby is born. After this time, you may choose to use a pacifier. Research has shown that pacifier use during the first year of a baby's life decreases the risk of sudden infant death syndrome (SIDS). Allow your baby to feed on each breast as long as he or she wants. When your baby unlatches or falls asleep while feeding from the first breast, offer the second breast. Because newborns are often sleepy in the first few weeks of life, you may need to awaken your baby to get him or her to feed. Breastfeeding times will vary from baby to baby. However, the following rules can serve as a guide to help you make sure that your baby is properly fed:  Newborns (babies 4 weeks of age or younger) may breastfeed every 1-3 hours.  Newborns should not go without breastfeeding for longer than 3 hours during the day or 5 hours during the night.  You should breastfeed your baby a minimum of 8 times in a 24-hour period. Breast milk pumping     Pumping and storing breast milk allows you to make sure that your baby is exclusively fed your breast milk, even at times when you are unable to breastfeed. This is especially important if you go back to work while you are still breastfeeding, or if you are not able to be present during feedings. Your lactation consultant can help you find a method of pumping that works best for you and give you guidelines about how long it is safe to store breast milk. Caring for your breasts while you breastfeed Nipples can become dry, cracked, and sore while breastfeeding. The following recommendations can help keep your breasts moisturized and healthy:  Avoid using soap on your nipples.  Wear a supportive bra designed especially for nursing. Avoid wearing underwire-style bras or extremely tight bras (sports bras).  Air-dry your nipples for 3-4 minutes after each feeding.  Use only  cotton bra pads to absorb leaked breast milk. Leaking of breast milk between feedings is normal.  Use lanolin on your nipples after breastfeeding. Lanolin helps to maintain your skin's normal moisture barrier. Pure lanolin is not harmful (not toxic) to your baby. You may also hand express a few drops of breast milk and gently massage that milk into your nipples and allow the milk to air-dry. In the first few weeks after giving birth, some women experience breast engorgement. Engorgement can make your breasts feel heavy, warm, and tender to the touch. Engorgement   peaks within 3-5 days after you give birth. The following recommendations can help to ease engorgement:  Completely empty your breasts while breastfeeding or pumping. You may want to start by applying warm, moist heat (in the shower or with warm, water-soaked hand towels) just before feeding or pumping. This increases circulation and helps the milk flow. If your baby does not completely empty your breasts while breastfeeding, pump any extra milk after he or she is finished.  Apply ice packs to your breasts immediately after breastfeeding or pumping, unless this is too uncomfortable for you. To do this: ? Put ice in a plastic bag. ? Place a towel between your skin and the bag. ? Leave the ice on for 20 minutes, 2-3 times a day.  Make sure that your baby is latched on and positioned properly while breastfeeding. If engorgement persists after 48 hours of following these recommendations, contact your health care provider or a lactation consultant. Overall health care recommendations while breastfeeding  Eat 3 healthy meals and 3 snacks every day. Well-nourished mothers who are breastfeeding need an additional 450-500 calories a day. You can meet this requirement by increasing the amount of a balanced diet that you eat.  Drink enough water to keep your urine pale yellow or clear.  Rest often, relax, and continue to take your prenatal vitamins  to prevent fatigue, stress, and low vitamin and mineral levels in your body (nutrient deficiencies).  Do not use any products that contain nicotine or tobacco, such as cigarettes and e-cigarettes. Your baby may be harmed by chemicals from cigarettes that pass into breast milk and exposure to secondhand smoke. If you need help quitting, ask your health care provider.  Avoid alcohol.  Do not use illegal drugs or marijuana.  Talk with your health care provider before taking any medicines. These include over-the-counter and prescription medicines as well as vitamins and herbal supplements. Some medicines that may be harmful to your baby can pass through breast milk.  It is possible to become pregnant while breastfeeding. If birth control is desired, ask your health care provider about options that will be safe while breastfeeding your baby. Where to find more information: [employer]: www.llli.org Contact a health care provider if:  You feel like you want to stop breastfeeding or have become frustrated with breastfeeding.  Your nipples are cracked or bleeding.  Your breasts are red, tender, or warm.  You have: ? Painful breasts or nipples. ? A swollen area on either breast. ? A fever or chills. ? Nausea or vomiting. ? Drainage other than breast milk from your nipples.  Your breasts do not become full before feedings by the fifth day after you give birth.  You feel sad and depressed.  Your baby is: ? Too sleepy to eat well. ? Having trouble sleeping. ? More than 1 week old and wetting fewer than 6 diapers in a 24-hour period. ? Not gaining weight by 5 days of age.  Your baby has fewer than 3 stools in a 24-hour period.  Your baby's skin or the white parts of his or her eyes become yellow. Get help right away if:  Your baby is overly tired (lethargic) and does not want to wake up and feed.  Your baby develops an unexplained fever. Summary  Breastfeeding  offers many health benefits for infant and mothers.  Try to breastfeed your infant when he or she shows early signs of hunger.  Gently tickle or stroke your baby's lips with   your finger or nipple to allow the baby to open his or her mouth. Bring the baby to your breast. Make sure that much of the areola is in your baby's mouth. Offer one side and burp the baby before you offer the other side.  Talk with your health care provider or lactation consultant if you have questions or you face problems as you breastfeed. This information is not intended to replace advice given to you by your health care provider. Make sure you discuss any questions you have with your health care provider. Document Revised: 01/26/2018 Document Reviewed: 12/03/2016 Elsevier Patient Education  2020 Elsevier Inc.  

## 2020-10-23 ENCOUNTER — Other Ambulatory Visit: Payer: Self-pay

## 2020-10-23 ENCOUNTER — Ambulatory Visit: Payer: Medicaid Other | Attending: Obstetrics and Gynecology

## 2020-10-23 ENCOUNTER — Encounter: Payer: Self-pay | Admitting: *Deleted

## 2020-10-23 ENCOUNTER — Ambulatory Visit: Payer: Medicaid Other | Admitting: *Deleted

## 2020-10-23 DIAGNOSIS — O0990 Supervision of high risk pregnancy, unspecified, unspecified trimester: Secondary | ICD-10-CM | POA: Insufficient documentation

## 2020-10-23 DIAGNOSIS — N049 Nephrotic syndrome with unspecified morphologic changes: Secondary | ICD-10-CM

## 2020-10-23 DIAGNOSIS — Z3A30 30 weeks gestation of pregnancy: Secondary | ICD-10-CM | POA: Diagnosis not present

## 2020-10-23 DIAGNOSIS — Z362 Encounter for other antenatal screening follow-up: Secondary | ICD-10-CM

## 2020-10-23 DIAGNOSIS — O99413 Diseases of the circulatory system complicating pregnancy, third trimester: Secondary | ICD-10-CM | POA: Diagnosis not present

## 2020-10-23 DIAGNOSIS — O30043 Twin pregnancy, dichorionic/diamniotic, third trimester: Secondary | ICD-10-CM | POA: Insufficient documentation

## 2020-10-23 DIAGNOSIS — I4891 Unspecified atrial fibrillation: Secondary | ICD-10-CM | POA: Diagnosis not present

## 2020-10-23 DIAGNOSIS — O361930 Maternal care for other isoimmunization, third trimester, not applicable or unspecified: Secondary | ICD-10-CM | POA: Diagnosis not present

## 2020-10-23 DIAGNOSIS — O34219 Maternal care for unspecified type scar from previous cesarean delivery: Secondary | ICD-10-CM | POA: Diagnosis not present

## 2020-10-23 DIAGNOSIS — O09293 Supervision of pregnancy with other poor reproductive or obstetric history, third trimester: Secondary | ICD-10-CM

## 2020-10-23 DIAGNOSIS — O99891 Other specified diseases and conditions complicating pregnancy: Secondary | ICD-10-CM

## 2020-10-24 ENCOUNTER — Other Ambulatory Visit: Payer: Self-pay | Admitting: *Deleted

## 2020-10-24 DIAGNOSIS — O361930 Maternal care for other isoimmunization, third trimester, not applicable or unspecified: Secondary | ICD-10-CM

## 2020-10-27 ENCOUNTER — Ambulatory Visit: Payer: Medicaid Other | Admitting: *Deleted

## 2020-10-27 ENCOUNTER — Other Ambulatory Visit: Payer: Self-pay

## 2020-10-27 ENCOUNTER — Ambulatory Visit: Payer: Medicaid Other | Attending: Obstetrics and Gynecology

## 2020-10-27 ENCOUNTER — Encounter: Payer: Self-pay | Admitting: *Deleted

## 2020-10-27 DIAGNOSIS — O99891 Other specified diseases and conditions complicating pregnancy: Secondary | ICD-10-CM | POA: Diagnosis not present

## 2020-10-27 DIAGNOSIS — I4891 Unspecified atrial fibrillation: Secondary | ICD-10-CM

## 2020-10-27 DIAGNOSIS — O34219 Maternal care for unspecified type scar from previous cesarean delivery: Secondary | ICD-10-CM

## 2020-10-27 DIAGNOSIS — O09293 Supervision of pregnancy with other poor reproductive or obstetric history, third trimester: Secondary | ICD-10-CM

## 2020-10-27 DIAGNOSIS — O30043 Twin pregnancy, dichorionic/diamniotic, third trimester: Secondary | ICD-10-CM | POA: Diagnosis not present

## 2020-10-27 DIAGNOSIS — Z3A31 31 weeks gestation of pregnancy: Secondary | ICD-10-CM | POA: Diagnosis not present

## 2020-10-27 DIAGNOSIS — O0990 Supervision of high risk pregnancy, unspecified, unspecified trimester: Secondary | ICD-10-CM

## 2020-10-27 DIAGNOSIS — Z362 Encounter for other antenatal screening follow-up: Secondary | ICD-10-CM

## 2020-10-27 DIAGNOSIS — O99413 Diseases of the circulatory system complicating pregnancy, third trimester: Secondary | ICD-10-CM

## 2020-10-27 DIAGNOSIS — O361930 Maternal care for other isoimmunization, third trimester, not applicable or unspecified: Secondary | ICD-10-CM | POA: Diagnosis not present

## 2020-10-27 DIAGNOSIS — N049 Nephrotic syndrome with unspecified morphologic changes: Secondary | ICD-10-CM | POA: Diagnosis not present

## 2020-10-27 NOTE — Progress Notes (Signed)
C/o" leaking clear watery fluid mixed with mucous since 10am."

## 2020-10-30 ENCOUNTER — Ambulatory Visit: Payer: Medicaid Other | Attending: Obstetrics and Gynecology

## 2020-10-30 ENCOUNTER — Ambulatory Visit: Payer: Medicaid Other | Admitting: *Deleted

## 2020-10-30 ENCOUNTER — Other Ambulatory Visit: Payer: Self-pay

## 2020-10-30 ENCOUNTER — Encounter: Payer: Self-pay | Admitting: *Deleted

## 2020-10-30 ENCOUNTER — Ambulatory Visit (INDEPENDENT_AMBULATORY_CARE_PROVIDER_SITE_OTHER): Payer: Medicaid Other | Admitting: Obstetrics & Gynecology

## 2020-10-30 ENCOUNTER — Encounter: Payer: Self-pay | Admitting: Obstetrics & Gynecology

## 2020-10-30 VITALS — BP 121/63 | HR 88 | Wt 211.4 lb

## 2020-10-30 DIAGNOSIS — O09293 Supervision of pregnancy with other poor reproductive or obstetric history, third trimester: Secondary | ICD-10-CM

## 2020-10-30 DIAGNOSIS — O0990 Supervision of high risk pregnancy, unspecified, unspecified trimester: Secondary | ICD-10-CM

## 2020-10-30 DIAGNOSIS — O321XX1 Maternal care for breech presentation, fetus 1: Secondary | ICD-10-CM

## 2020-10-30 DIAGNOSIS — O30043 Twin pregnancy, dichorionic/diamniotic, third trimester: Secondary | ICD-10-CM | POA: Diagnosis not present

## 2020-10-30 DIAGNOSIS — Z3A31 31 weeks gestation of pregnancy: Secondary | ICD-10-CM

## 2020-10-30 DIAGNOSIS — O361930 Maternal care for other isoimmunization, third trimester, not applicable or unspecified: Secondary | ICD-10-CM | POA: Diagnosis not present

## 2020-10-30 DIAGNOSIS — O34219 Maternal care for unspecified type scar from previous cesarean delivery: Secondary | ICD-10-CM | POA: Diagnosis not present

## 2020-10-30 DIAGNOSIS — Z98891 History of uterine scar from previous surgery: Secondary | ICD-10-CM

## 2020-10-30 NOTE — Progress Notes (Signed)
   PRENATAL VISIT NOTE  Subjective:  Michele Maldonado is a 34 y.o. [id number] at [redacted]w[redacted]d being seen today for ongoing prenatal care.  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  Patient reports occasional contractions.  Contractions: Irritability. Vag. Bleeding: None.  Movement: Present. Denies leaking of fluid.   The following portions of the patient's history were reviewed and updated as appropriate: allergies, current medications, past family history, past medical history, past social history, past surgical history and problem list.   Objective:   Vitals:   10/30/20 1129  BP: 121/63  Pulse: 88  Weight: 211 lb 6.4 oz (95.9 kg)    Fetal Status: Fetal Heart Rate (bpm): 134/128 Fundal Height: 41 cm Movement: Present     General:  Alert, oriented and cooperative. Patient is in no acute distress.  Skin: Skin is warm and dry. No rash noted.   Cardiovascular: Normal heart rate noted  Respiratory: Normal respiratory effort, no problems with respiration noted  Abdomen: Soft, gravid, appropriate for gestational age.  Pain/Pressure: Absent     Pelvic: Cervical exam deferred        Extremities: Normal range of motion.  Edema: None  Mental Status: Normal mood and affect. Normal behavior. Normal judgment and thought content.   Assessment and Plan:  Pregnancy: [id number] at [redacted]w[redacted]d 1. Supervision of high risk pregnancy, antepartum Normal GTT  2. Dichorionic diamniotic twin pregnancy in third trimester Follow for growth and MCA dopplers in [facility]  3. History of 2 cesarean sections Would like TOLAC if spontaneous labor  4. Kell isoimmunization during pregnancy in third  trimester, single or unspecified fetus Normal MCA dopplers  Preterm labor symptoms and general obstetric precautions including but not limited to vaginal bleeding, contractions, leaking of fluid and fetal movement were reviewed in detail with the patient. Please refer to After Visit Summary for other counseling recommendations.   Return in about 2 weeks (around 11/13/2020).  Future Appointments  Date Time Provider Department Center  11/06/2020  9:30 AM [facility] NURSE [facility]  11/06/2020  9:45 AM [facility] US4 [facility]  11/13/2020  9:30 AM [facility] NURSE [facility]  11/13/2020  9:45 AM [facility]  11/28/2020  8:45 AM Duke Salvia, MD [facility]    Scheryl Darter, MD

## 2020-10-30 NOTE — Patient Instructions (Signed)

## 2020-10-31 ENCOUNTER — Encounter: Payer: Medicaid Other | Admitting: Family Medicine

## 2020-11-06 ENCOUNTER — Ambulatory Visit: Payer: Medicaid Other | Admitting: *Deleted

## 2020-11-06 ENCOUNTER — Other Ambulatory Visit: Payer: Self-pay

## 2020-11-06 ENCOUNTER — Ambulatory Visit: Payer: Medicaid Other | Attending: Obstetrics and Gynecology

## 2020-11-06 ENCOUNTER — Encounter: Payer: Self-pay | Admitting: *Deleted

## 2020-11-06 DIAGNOSIS — O30043 Twin pregnancy, dichorionic/diamniotic, third trimester: Secondary | ICD-10-CM | POA: Diagnosis not present

## 2020-11-06 DIAGNOSIS — O09293 Supervision of pregnancy with other poor reproductive or obstetric history, third trimester: Secondary | ICD-10-CM

## 2020-11-06 DIAGNOSIS — O361930 Maternal care for other isoimmunization, third trimester, not applicable or unspecified: Secondary | ICD-10-CM

## 2020-11-06 DIAGNOSIS — I4891 Unspecified atrial fibrillation: Secondary | ICD-10-CM | POA: Diagnosis not present

## 2020-11-06 DIAGNOSIS — O99891 Other specified diseases and conditions complicating pregnancy: Secondary | ICD-10-CM

## 2020-11-06 DIAGNOSIS — O0990 Supervision of high risk pregnancy, unspecified, unspecified trimester: Secondary | ICD-10-CM

## 2020-11-06 DIAGNOSIS — N049 Nephrotic syndrome with unspecified morphologic changes: Secondary | ICD-10-CM | POA: Diagnosis not present

## 2020-11-06 DIAGNOSIS — O99413 Diseases of the circulatory system complicating pregnancy, third trimester: Secondary | ICD-10-CM | POA: Diagnosis not present

## 2020-11-06 DIAGNOSIS — Z3A32 32 weeks gestation of pregnancy: Secondary | ICD-10-CM | POA: Diagnosis not present

## 2020-11-09 ENCOUNTER — Inpatient Hospital Stay (HOSPITAL_COMMUNITY)
Admission: AD | Admit: 2020-11-09 | Discharge: 2020-11-09 | Disposition: A | Discharge status: Home / Self Care | Payer: Medicaid Other | Attending: Obstetrics and Gynecology | Admitting: Obstetrics and Gynecology

## 2020-11-09 ENCOUNTER — Other Ambulatory Visit: Payer: Self-pay

## 2020-11-10 NOTE — MAU Note (Addendum)
Went to [employer] to call patient back and patient's significant other reported she had just walked out and he would go get her. Informed registration to call and let us know when the patient returns. Thirty minutes later registration called to notify us that the patient never returned.   Patient was never seen nor treated in MAU.

## 2020-11-13 ENCOUNTER — Encounter: Payer: Self-pay | Admitting: *Deleted

## 2020-11-13 ENCOUNTER — Ambulatory Visit: Payer: Medicaid Other | Admitting: *Deleted

## 2020-11-13 ENCOUNTER — Ambulatory Visit: Payer: Medicaid Other | Attending: Obstetrics and Gynecology

## 2020-11-13 ENCOUNTER — Other Ambulatory Visit: Payer: Self-pay

## 2020-11-13 ENCOUNTER — Other Ambulatory Visit: Payer: Self-pay | Admitting: *Deleted

## 2020-11-13 DIAGNOSIS — O09293 Supervision of pregnancy with other poor reproductive or obstetric history, third trimester: Secondary | ICD-10-CM | POA: Diagnosis not present

## 2020-11-13 DIAGNOSIS — O34219 Maternal care for unspecified type scar from previous cesarean delivery: Secondary | ICD-10-CM | POA: Diagnosis not present

## 2020-11-13 DIAGNOSIS — O0990 Supervision of high risk pregnancy, unspecified, unspecified trimester: Secondary | ICD-10-CM | POA: Insufficient documentation

## 2020-11-13 DIAGNOSIS — O361930 Maternal care for other isoimmunization, third trimester, not applicable or unspecified: Secondary | ICD-10-CM | POA: Diagnosis not present

## 2020-11-13 DIAGNOSIS — O30043 Twin pregnancy, dichorionic/diamniotic, third trimester: Secondary | ICD-10-CM

## 2020-11-13 DIAGNOSIS — O321XX1 Maternal care for breech presentation, fetus 1: Secondary | ICD-10-CM | POA: Diagnosis not present

## 2020-11-13 DIAGNOSIS — O2693 Pregnancy related conditions, unspecified, third trimester: Secondary | ICD-10-CM

## 2020-11-13 DIAGNOSIS — N049 Nephrotic syndrome with unspecified morphologic changes: Secondary | ICD-10-CM | POA: Diagnosis not present

## 2020-11-13 DIAGNOSIS — Z3A33 33 weeks gestation of pregnancy: Secondary | ICD-10-CM | POA: Diagnosis not present

## 2020-11-15 DIAGNOSIS — Z419 Encounter for procedure for purposes other than remedying health state, unspecified: Secondary | ICD-10-CM | POA: Diagnosis not present

## 2020-11-16 ENCOUNTER — Encounter (HOSPITAL_COMMUNITY): Payer: Self-pay | Admitting: Obstetrics and Gynecology

## 2020-11-16 ENCOUNTER — Inpatient Hospital Stay (HOSPITAL_COMMUNITY)
Admission: AD | Admit: 2020-11-16 | Discharge: 2020-11-16 | Disposition: A | Discharge status: Home / Self Care | Payer: Medicaid Other | Attending: Obstetrics and Gynecology | Admitting: Obstetrics and Gynecology

## 2020-11-16 DIAGNOSIS — R109 Unspecified abdominal pain: Secondary | ICD-10-CM | POA: Diagnosis not present

## 2020-11-16 DIAGNOSIS — O0990 Supervision of high risk pregnancy, unspecified, unspecified trimester: Secondary | ICD-10-CM

## 2020-11-16 DIAGNOSIS — O26893 Other specified pregnancy related conditions, third trimester: Secondary | ICD-10-CM | POA: Diagnosis not present

## 2020-11-16 DIAGNOSIS — Z3A33 33 weeks gestation of pregnancy: Secondary | ICD-10-CM | POA: Diagnosis not present

## 2020-11-16 DIAGNOSIS — R102 Pelvic and perineal pain: Secondary | ICD-10-CM | POA: Diagnosis not present

## 2020-11-16 DIAGNOSIS — O30043 Twin pregnancy, dichorionic/diamniotic, third trimester: Secondary | ICD-10-CM | POA: Diagnosis not present

## 2020-11-16 DIAGNOSIS — O2343 Unspecified infection of urinary tract in pregnancy, third trimester: Secondary | ICD-10-CM | POA: Diagnosis not present

## 2020-11-16 DIAGNOSIS — N390 Urinary tract infection, site not specified: Secondary | ICD-10-CM | POA: Insufficient documentation

## 2020-11-16 LAB — COMPREHENSIVE METABOLIC PANEL
ALT: 14 U/L (ref 0–44)
AST: 16 U/L (ref 15–41)
Albumin: 2.6 g/dL — ABNORMAL LOW (ref 3.5–5.0)
Alkaline Phosphatase: 74 U/L (ref 38–126)
Anion gap: 9 (ref 5–15)
BUN: <5 mg/dL — ABNORMAL LOW (ref 6–20)
CO2: 19 mmol/L — ABNORMAL LOW (ref 22–32)
Calcium: 8.3 mg/dL — ABNORMAL LOW (ref 8.9–10.3)
Chloride: 105 mmol/L (ref 98–111)
Creatinine, Ser: 0.54 mg/dL (ref 0.44–1.00)
GFR, Estimated: >60 mL/min (ref >60)
Glucose, Bld: 85 mg/dL (ref 70–99)
Potassium: 3.6 mmol/L (ref 3.5–5.1)
Sodium: 133 mmol/L — ABNORMAL LOW (ref 135–145)
Total Bilirubin: 0.7 mg/dL (ref 0.3–1.2)
Total Protein: 5.8 g/dL — ABNORMAL LOW (ref 6.5–8.1)

## 2020-11-16 LAB — URINALYSIS, ROUTINE W REFLEX MICROSCOPIC
Bilirubin Urine: NEGATIVE
Glucose, UA: NEGATIVE mg/dL
Ketones, ur: NEGATIVE mg/dL
Nitrite: NEGATIVE
Protein, ur: 30 mg/dL — AB
Specific Gravity, Urine: 1.014 (ref 1.005–1.030)
pH: 6 (ref 5.0–8.0)

## 2020-11-16 LAB — CBC
HCT: 32.5 % — ABNORMAL LOW (ref 36.0–46.0)
Hemoglobin: 11.1 g/dL — ABNORMAL LOW (ref 12.0–15.0)
MCH: 31 pg (ref 26.0–34.0)
MCHC: 34.2 g/dL (ref 30.0–36.0)
MCV: 90.8 fL (ref 80.0–100.0)
Platelets: 135 10*3/uL — ABNORMAL LOW (ref 150–400)
RBC: 3.58 MIL/uL — ABNORMAL LOW (ref 3.87–5.11)
RDW: 14.7 % (ref 11.5–15.5)
WBC: 9.7 10*3/uL (ref 4.0–10.5)
nRBC: 0 % (ref 0.0–0.2)

## 2020-11-16 MED ORDER — TERCONAZOLE 0.8 % VA CREA: 1.0000 | TOPICAL_CREAM | Freq: Every day | VAGINAL | 0 refills | Status: DC

## 2020-11-16 MED ORDER — CEFADROXIL 500 MG PO CAPS: 500.0000 mg | ORAL_CAPSULE | Freq: Two times a day (BID) | ORAL | 0 refills | Status: DC

## 2020-11-16 MED ORDER — CYCLOBENZAPRINE HCL 10 MG PO TABS: 10.0000 mg | ORAL_TABLET | Freq: Two times a day (BID) | ORAL | 0 refills | Status: DC | PRN

## 2020-11-16 NOTE — MAU Provider Note (Signed)
History     CSN: [id number]  Arrival date and time: 11/16/20 1108   Event Date/Time   First Provider Initiated Contact with Patient 11/16/20 1204      Chief Complaint  Patient presents with  . Abdominal Pain  . Dizziness   HPI Michele Maldonado is a 35 y.o. [id number] at 59w6dwith di/di twin gestation. She presents to MAU with chief complaint of dizziness and extreme fatigue, onset one week ago. Patient reports brief episodes of dizziness throughout the day, typically more at bedtime. She endorses drinking large volumes of water throughout the day.   Patient also reports recurrent suprapubic cramping, onset about one week ago. She denies abnormal vaginal discharge, dysuria, flank pain. She denies vaginal bleeding, leaking of fluid, decreased fetal movement, fever, falls, or recent illness.    [facility] History    Gravida  7   Para  6   Term  4   Preterm  2   AB  0   Living  5     SAB  0   IAB  0   Ectopic  0   Multiple  0   Live Births  6        Obstetric Comments  Child born in 203-28-2016deceased --- died at 549 monthsold, taken to ED unresponsive -- pt not ready to talk         Past Medical History:  Diagnosis Date  . Anemia   . [place]   . Kell isoimmunization during pregnancy    a. per prior notes - h/o anti Kell alloimmunization.  . MVP (mitral valve prolapse)   . Thrombocytopenia ([facility]   . Ventricular septal defect     Past Surgical History:  Procedure Laterality Date  . CESAREAN SECTION N/A 12/29/2013   Procedure: CESAREAN SECTION;  Surgeon: MEmily Filbert MD;  [place];  Service: Obstetrics;  Laterality: N/A;  . REPAIR VAGINAL CUFF N/A 12/30/2013   Procedure: REPAIR VAGINAL CUFF;  Surgeon: MEmily Filbert MD;  [place] [facility];  Service: Gynecology;  Laterality: N/A;  Insertion of Bakri Balloon    Family History  Problem Relation Age of Onset  . Hypertension Maternal Grandmother   . Diabetes  Maternal Grandmother   . Thyroid disease Mother   . Atrial fibrillation Maternal Grandfather     Social History   Tobacco Use  . Smoking status: Never Smoker  . Smokeless tobacco: Never Used  Vaping Use  . Vaping Use: Never used  Substance Use Topics  . Alcohol use: Not Currently  . Drug use: No    Types: Marijuana    Comment: former    Allergies: No Known Allergies  Medications Prior to Admission  Medication Sig Dispense Refill Last Dose  . aspirin EC 81 MG tablet Take 81 mg by mouth daily. Swallow whole.     . Blood Pressure Monitoring (BLOOD PRESSURE KIT) DEVI 1 Device by Does not apply route as needed. 1 each 0   . prenatal vitamin w/FE, FA (PRENATAL 1 + 1) 27-1 MG TABS tablet Take 1 tablet by mouth daily at 12 noon. 30 tablet 6     Review of Systems  Gastrointestinal: Positive for abdominal pain.  Genitourinary: Negative for dysuria, flank pain, frequency and hematuria.  Musculoskeletal: Negative for back pain.  Neurological: Positive for dizziness.  All other systems reviewed and are negative.  Physical Exam   Blood pressure 118/70, pulse 92, temperature  98.3 F (36.8 C), temperature source Oral, resp. rate 15, last menstrual period 03/24/2020, SpO2 98 %, unknown if currently breastfeeding.  Physical Exam Vitals and nursing note reviewed. Exam conducted with a chaperone present.  Constitutional:      Appearance: She is well-developed.  Cardiovascular:     Rate and Rhythm: Normal rate.     Heart sounds: Normal heart sounds.  Pulmonary:     Effort: Pulmonary effort is normal.     Breath sounds: Normal breath sounds.  Abdominal:     Palpations: Abdomen is soft.     Tenderness: There is no abdominal tenderness. There is no right CVA tenderness or left CVA tenderness.     Comments: Gravid  Genitourinary:    Comments: Pelvic exam: External genitalia normal, vaginal walls pink and well rugated, cervix visually closed, no lesions noted. Moderate amount of white  discharge visible at introitus and occurring in clumps. Will treat presumptively for yeast.  Skin:    General: Skin is warm.  Neurological:     Mental Status: She is alert and oriented to person, place, and time.  Psychiatric:        Mood and Affect: Mood normal.        Behavior: Behavior normal.     MAU Course  Procedures: speculum exam  --Reactive tracing x 2 --Baby A: baseline 130, mod var, + 15 x 15 accels, no decels --Baby B: baseline 135, mod var, + 15 x 15 accels, no decels --Toco: UI --Cervix visually closed on spec exam --Abnormal UA: WIll initiate treatment for UTI and send culture  Orders Placed This Encounter  Procedures  . Culture, OB Urine  . Urinalysis, Routine w reflex microscopic Urine, Clean Catch  . CBC  . Comprehensive metabolic panel   Patient Vitals for the past 24 hrs:  BP Temp Temp src Pulse Resp SpO2  11/16/20 1308 119/63 - - 88 18 -  11/16/20 1147 118/70 - - 92 - -  11/16/20 1130 122/75 98.3 F (36.8 C) Oral (!) 101 15 98 %   Results for orders placed or performed during the hospital encounter of 11/16/20 (from the past 24 hour(s))  Urinalysis, Routine w reflex microscopic Urine, Clean Catch     Status: Abnormal   Collection Time: 11/16/20 11:56 AM  Result Value Ref Range   Color, Urine YELLOW YELLOW   APPearance CLOUDY (A) CLEAR   Specific Gravity, Urine 1.014 1.005 - 1.030   pH 6.0 5.0 - 8.0   Glucose, UA NEGATIVE NEGATIVE mg/dL   Hgb urine dipstick SMALL (A) NEGATIVE   Bilirubin Urine NEGATIVE NEGATIVE   Ketones, ur NEGATIVE NEGATIVE mg/dL   Protein, ur 30 (A) NEGATIVE mg/dL   Nitrite NEGATIVE NEGATIVE   Leukocytes,Ua SMALL (A) NEGATIVE   RBC / HPF 0-5 0 - 5 RBC/hpf   WBC, UA 6-10 0 - 5 WBC/hpf   Bacteria, UA MANY (A) NONE SEEN   Squamous Epithelial / LPF 21-50 0 - 5   Mucus PRESENT   CBC     Status: Abnormal   Collection Time: 11/16/20 12:11 PM  Result Value Ref Range   WBC 9.7 4.0 - 10.5 K/uL   RBC 3.58 (L) 3.87 - 5.11 MIL/uL    Hemoglobin 11.1 (L) 12.0 - 15.0 g/dL   HCT 32.5 (L) 36.0 - 46.0 %   MCV 90.8 80.0 - 100.0 fL   MCH 31.0 26.0 - 34.0 pg   MCHC 34.2 30.0 - 36.0 g/dL   RDW 14.7 11.5 -  15.5 %   Platelets 135 (L) 150 - 400 K/uL   nRBC 0.0 0.0 - 0.2 %  Comprehensive metabolic panel     Status: Abnormal   Collection Time: 11/16/20 12:11 PM  Result Value Ref Range   Sodium 133 (L) 135 - 145 mmol/L   Potassium 3.6 3.5 - 5.1 mmol/L   Chloride 105 98 - 111 mmol/L   CO2 19 (L) 22 - 32 mmol/L   Glucose, Bld 85 70 - 99 mg/dL   BUN <5 (L) 6 - 20 mg/dL   Creatinine, Ser 0.54 0.44 - 1.00 mg/dL   Calcium 8.3 (L) 8.9 - 10.3 mg/dL   Total Protein 5.8 (L) 6.5 - 8.1 g/dL   Albumin 2.6 (L) 3.5 - 5.0 g/dL   AST 16 15 - 41 U/L   ALT 14 0 - 44 U/L   Alkaline Phosphatase 74 38 - 126 U/L   Total Bilirubin 0.7 0.3 - 1.2 mg/dL   GFR, Estimated >60 >60 mL/min   Anion gap 9 5 - 15   Meds ordered this encounter  Medications  . cefadroxil (DURICEF) 500 MG capsule    Sig: Take 1 capsule (500 mg total) by mouth 2 (two) times daily.    Dispense:  14 capsule    Refill:  0    Order Specific Question:   Supervising Provider    Answer:   Sloan Leiter [[id number]]  . terconazole (TERAZOL 3) 0.8 % vaginal cream    Sig: Place 1 applicator vaginally at bedtime. Apply nightly for three nights.    Dispense:  20 g    Refill:  0    Order Specific Question:   Supervising Provider    Answer:   Sloan Leiter [[id number]]  . cyclobenzaprine (FLEXERIL) 10 MG tablet    Sig: Take 1 tablet (10 mg total) by mouth 2 (two) times daily as needed for muscle spasms.    Dispense:  20 tablet    Refill:  0    Order Specific Question:   Supervising Provider    Answer:   Sloan Leiter [[id number]]   Assessment and Plan  --35 y.o. [id number] at [redacted]w[redacted]d --Reactive tracing x 2 --UTI in pregnancy, treatment prescribed, culture sent --Round ligament pain in third trimester --Discharge home in stable condition  F/U: --[facility]  [facility]  SDarlina Rumpf CNM 11/16/2020, 2:38 PM

## 2020-11-16 NOTE — MAU Note (Addendum)
.   Michele Maldonado is a 34 y.o. at [redacted]w[redacted]d here in MAU reporting: dizziness and seeing spots in her vision for x1 week. Reports headache on and off as well. She has also been cramping for a week. No Vb or LOF. Didi twins   Pain score: 5 Vitals:   11/16/20 1130  BP: 122/75  Pulse: (!) 101  Resp: 15  SpO2: 98%

## 2020-11-16 NOTE — Discharge Instructions (Signed)

## 2020-11-17 ENCOUNTER — Telehealth: Payer: Self-pay | Admitting: General Practice

## 2020-11-17 LAB — CULTURE, OB URINE: Culture: NO GROWTH

## 2020-11-17 NOTE — Telephone Encounter (Signed)
Called patient regarding mychart message concerns. Patient states she has been having a lot of swelling in her hands/feet that is painful and causing numbness. Patient also reports a lot of dizziness throughout the day and cramping in her abdomen/pelvis making it difficult to walk. Patient states she was told all this could be from a UTI and was given a Rx for antibiotics but she doesn't have a UTI. Patient is upset that her cervix wasn't checked either at MAU, they just did a speculum exam and looked in. Patient states she was told this was part of pregnancy but she hasn't had any of these things happen before and she has been pregnant 7 times. She states she has family in the medical profession and they told her none of this sounded right/normal to them. Discussed with patient I see she has an appt with [place] on Wednesday. Recommended she talk to the doctor about all of her concerns at that time. Discussed with her past history I am not certain how that could be related to everything she has going on and a doctor really needs to see her in person and evaluate her. Told patient she could take the flexeril for pelvic/hip pain in the meantime and could also take the cream for the yeast infection if she has been having itching/irritation or increased discharge. Patient verbalized understanding and states she wasn't checked for a yeast infection either and wants to hold off on that too. Told patient we could check her when she comes in for that as well if she would like. Patient verbalized understanding. Advised she continue drinking lots of water, eating/snacking throughout the day and keep her feet propped up as best able in the meantime. Patient verbalized understanding.

## 2020-11-19 ENCOUNTER — Encounter: Payer: Self-pay | Admitting: Family Medicine

## 2020-11-19 ENCOUNTER — Other Ambulatory Visit (HOSPITAL_COMMUNITY)
Admission: RE | Admit: 2020-11-19 | Discharge: 2020-11-19 | Disposition: A | Discharge status: Home / Self Care | Payer: Medicaid Other | Source: Ambulatory Visit | Attending: Family Medicine | Admitting: Family Medicine

## 2020-11-19 ENCOUNTER — Other Ambulatory Visit: Payer: Self-pay

## 2020-11-19 ENCOUNTER — Ambulatory Visit (INDEPENDENT_AMBULATORY_CARE_PROVIDER_SITE_OTHER): Payer: Medicaid Other | Admitting: Family Medicine

## 2020-11-19 VITALS — BP 118/64 | HR 98 | Wt 225.5 lb

## 2020-11-19 DIAGNOSIS — L299 Pruritus, unspecified: Secondary | ICD-10-CM

## 2020-11-19 DIAGNOSIS — N898 Other specified noninflammatory disorders of vagina: Secondary | ICD-10-CM

## 2020-11-19 DIAGNOSIS — I341 Nonrheumatic mitral (valve) prolapse: Secondary | ICD-10-CM

## 2020-11-19 DIAGNOSIS — O99713 Diseases of the skin and subcutaneous tissue complicating pregnancy, third trimester: Secondary | ICD-10-CM

## 2020-11-19 DIAGNOSIS — Z87441 Personal history of nephrotic syndrome: Secondary | ICD-10-CM | POA: Diagnosis not present

## 2020-11-19 DIAGNOSIS — O99113 Other diseases of the blood and blood-forming organs and certain disorders involving the immune mechanism complicating pregnancy, third trimester: Secondary | ICD-10-CM

## 2020-11-19 DIAGNOSIS — O30043 Twin pregnancy, dichorionic/diamniotic, third trimester: Secondary | ICD-10-CM

## 2020-11-19 DIAGNOSIS — D696 Thrombocytopenia, unspecified: Secondary | ICD-10-CM

## 2020-11-19 DIAGNOSIS — I480 Paroxysmal atrial fibrillation: Secondary | ICD-10-CM

## 2020-11-19 DIAGNOSIS — O0990 Supervision of high risk pregnancy, unspecified, unspecified trimester: Secondary | ICD-10-CM

## 2020-11-19 DIAGNOSIS — O26893 Other specified pregnancy related conditions, third trimester: Secondary | ICD-10-CM | POA: Insufficient documentation

## 2020-11-19 DIAGNOSIS — O361930 Maternal care for other isoimmunization, third trimester, not applicable or unspecified: Secondary | ICD-10-CM

## 2020-11-19 DIAGNOSIS — Z98891 History of uterine scar from previous surgery: Secondary | ICD-10-CM

## 2020-11-19 MED ORDER — COMFORT FIT MATERNITY SUPP LG MISC: 1.0000 | 0 refills | Status: DC | PRN

## 2020-11-19 NOTE — Progress Notes (Signed)
Subjective:  Michele Maldonado is a 35 y.o. [id number] at [redacted]w[redacted]d being seen today for ongoing prenatal care.  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  Patient reports whitish vaginal discharge and frequent pelvic pains.  Contractions: Irritability. Vag. Bleeding: None.  Movement: Present. Denies leaking of fluid.   Reports swelling and itching of hands and feet Upset about visit to the MAU, felt like her concerns were not adequately addressed at her recent visit Wants to know why she was given treatment for a UTI but her culture returned negative and why she was treated for a yeast infection with no swab Overall feeling very scared as she feels like is having similar symptoms to when she gave birth in 2015 which was a very traumatic experience  The following portions of the patient's history were reviewed and updated as appropriate: allergies, current medications, past family history, past medical history, past social history, past surgical history and problem list. Problem list updated.  Objective:   Vitals:   11/19/20 1020  BP: 118/64  Pulse: 98  Weight: 225 lb 8 oz (102.3 kg)    Fetal Status: Fetal Heart Rate (bpm): 137/140   Movement: Present     General:  Alert, oriented and cooperative. Patient is in no acute distress.  Skin: Skin is warm and dry. No rash noted.   Cardiovascular: Normal heart rate noted  Respiratory: Normal respiratory effort, no problems with respiration noted  Abdomen: Soft, gravid, appropriate for gestational age. Pain/Pressure: Absent     Pelvic: Vag. Bleeding: None Vag D/C Character: White   Cervical exam deferred       SSE: cervix closed, no pooling or abnormal discharge, normal physiologic discharge present  Extremities: Normal range of motion.  Edema: None  Mental Status: Normal mood and affect. Normal behavior. Normal judgment and thought content.   Urinalysis:      Assessment and Plan:  Pregnancy: [id number] at [redacted]w[redacted]d  1. Vaginal discharge in pregnancy in third trimester Per patient request - Cervicovaginal ancillary only( [place])  2. Supervision of high risk pregnancy, antepartum Long discussion with patient regarding recent MAU visit, discussed that I had reviewed the chart and all care she was received was appropriate and in line with standards of care Explained that empiric treatment for UTI or yeast vaginitis based on symptoms/exam is common Addressed concerns regarding her symptoms and a possible connection with her pregnancy in 2015 Currently I told her I was not concerned with majority of her symptoms, especially given she is a grand multip and pregnant with her first twin pregnancy No significant swelling seen on exam, but given her report of feeling edematous a well as palmar/plantar itching will send a repeat CMP (unremarkable in MAU several days prior) and bile acids Given report of feeling edematous will also recheck UPCR, explained to patient why we were doing this but reassured her that I had low suspicion for this as her Cr has been normal of late and no recent UA's have shown any significant proteinuria Reassured patient that we understand she is advocating for herself and her babies, and she should always feel like she can bring her concerns to [place] Will trial maternity belt for discomfort, provided printed  rx and given information on locations that will accept it  3. Dichorionic diamniotic twin pregnancy in third trimester Following regularly w MFM Normal growth, discordancy 15%, normal MCA dopplers on 11/13/2020, has f/u scheduled for tomorrow  4. 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 Hx of thrombocytopenia Has mild gestational thrombocytopenia as of this month  6. History of nephrotic syndrome Recheck UPCR today, see above, baseline was normal  7. History of 2 cesarean sections Discussed breech presentation of twin A, understands she will need a section even if she goes into spontaneous labor  8. Mitral valve prolapse Normal echo this pregnancy  9. Paroxysmal atrial fibrillation [facility]) Was supposed to see Dr. Graciela Husbands on 11/28/2020, per chart appears patient cancelled appt Did not have time to address this today, will f/u at next visit  Preterm labor symptoms and general obstetric precautions including but not limited to vaginal bleeding, contractions, leaking of fluid and fetal movement were reviewed in detail with the patient. Please refer to After Visit Summary for other counseling recommendations.  Return in 2 weeks (on 12/03/2020).   [name], MD

## 2020-11-19 NOTE — Patient Instructions (Addendum)
You can get a maternity belt at [facility] or [facility] medical supply.     Breastfeeding  Choosing to breastfeed is one of the best decisions you can make for yourself and your baby. A change in hormones during pregnancy causes your breasts to make breast milk in your milk-producing glands. Hormones prevent breast milk from being released before your baby is born. They also prompt milk flow after birth. Once breastfeeding has begun, thoughts of your baby, as well as his or her sucking or crying, can stimulate the release of milk from your milk-producing glands. Benefits of breastfeeding Research shows that breastfeeding offers many health benefits for infants and mothers. It also offers a cost-free and convenient way to feed your baby. For your baby  Your first milk (colostrum) helps your baby's digestive system to function better.  Special cells in your milk (antibodies) help your baby to fight off infections.  Breastfed babies are less likely to develop asthma, allergies, obesity, or type 2 diabetes. They are also at lower risk for sudden infant death syndrome (SIDS).  Nutrients in breast milk are better able to meet your baby's needs compared to infant formula.  Breast milk improves your baby's brain development. For you  Breastfeeding helps to create a very special bond between you and your baby.  Breastfeeding is convenient. Breast milk costs nothing and is always available at the correct temperature.  Breastfeeding helps to burn calories. It helps you to lose the weight that you gained during pregnancy.  Breastfeeding makes your uterus return faster to its size before pregnancy. It also slows bleeding (lochia) after you give birth.  Breastfeeding helps to lower your risk of developing type 2 diabetes, osteoporosis, rheumatoid arthritis, cardiovascular disease, and breast, ovarian, uterine, and endometrial cancer later in life. Breastfeeding basics Starting  breastfeeding  Find a comfortable place to sit or lie down, with your neck and back well-supported.  Place a pillow or a rolled-up blanket under your baby to bring him or her to the level of your breast (if you are seated). Nursing pillows are specially designed to help support your arms and your baby while you breastfeed.  Make sure that your baby's tummy (abdomen) is facing your abdomen.  Gently massage your breast. With your fingertips, massage from the outer edges of your breast inward toward the nipple. This encourages milk flow. If your milk flows slowly, you may need to continue this action during the feeding.  Support your breast with 4 fingers underneath and your thumb above your nipple (make the letter "C" with your hand). Make sure your fingers are well away from your nipple and your baby's mouth.  Stroke your baby's lips gently with your finger or nipple.  When your baby's mouth is open wide enough, quickly bring your baby to your breast, placing your entire nipple and as much of the areola as possible into your baby's mouth. The areola is the colored area around your nipple. ? More areola should be visible above your baby's upper lip than below the lower lip. ? Your baby's lips should be opened and extended outward (flanged) to ensure an adequate, comfortable latch. ? Your baby's tongue should be between his or her lower gum and your breast.  Make sure that your baby's mouth is correctly positioned around your nipple (latched). Your baby's lips should create a seal on your breast and be turned out (everted).  It is common for your baby to suck about 2-3 minutes in order  to start the flow of breast milk. Latching Teaching your baby how to latch onto your breast properly is very important. An improper latch can cause nipple pain, decreased milk supply, and poor weight gain in your baby. Also, if your baby is not latched onto your nipple properly, he or she may swallow some air  during feeding. This can make your baby fussy. Burping your baby when you switch breasts during the feeding can help to get rid of the air. However, teaching your baby to latch on properly is still the best way to prevent fussiness from swallowing air while breastfeeding. Signs that your baby has successfully latched onto your nipple  Silent tugging or silent sucking, without causing you pain. Infant's lips should be extended outward (flanged).  Swallowing heard between every 3-4 sucks once your milk has started to flow (after your let-down milk reflex occurs).  Muscle movement above and in front of his or her ears while sucking. Signs that your baby has not successfully latched onto your nipple  Sucking sounds or smacking sounds from your baby while breastfeeding.  Nipple pain. If you think your baby has not latched on correctly, slip your finger into the corner of your baby's mouth to break the suction and place it between your baby's gums. Attempt to start breastfeeding again. Signs of successful breastfeeding Signs from your baby  Your baby will gradually decrease the number of sucks or will completely stop sucking.  Your baby will fall asleep.  Your baby's body will relax.  Your baby will retain a small amount of milk in his or her mouth.  Your baby will let go of your breast by himself or herself. Signs from you  Breasts that have increased in firmness, weight, and size 1-3 hours after feeding.  Breasts that are softer immediately after breastfeeding.  Increased milk volume, as well as a change in milk consistency and color by the fifth day of breastfeeding.  Nipples that are not sore, cracked, or bleeding. Signs that your baby is getting enough milk  Wetting at least 1-2 diapers during the first 24 hours after birth.  Wetting at least 5-6 diapers every 24 hours for the first week after birth. The urine should be clear or pale yellow by the age of 5 days.  Wetting 6-8  diapers every 24 hours as your baby continues to grow and develop.  At least 3 stools in a 24-hour period by the age of 5 days. The stool should be soft and yellow.  At least 3 stools in a 24-hour period by the age of 7 days. The stool should be seedy and yellow.  No loss of weight greater than 10% of birth weight during the first 3 days of life.  Average weight gain of 4-7 oz (113-198 g) per week after the age of 4 days.  Consistent daily weight gain by the age of 5 days, without weight loss after the age of 2 weeks. After a feeding, your baby may spit up a small amount of milk. This is normal. Breastfeeding frequency and duration Frequent feeding will help you make more milk and can prevent sore nipples and extremely full breasts (breast engorgement). Breastfeed when you feel the need to reduce the fullness of your breasts or when your baby shows signs of hunger. This is called "breastfeeding on demand." Signs that your baby is hungry include:  Increased alertness, activity, or restlessness.  Movement of the head from side to side.  Opening of the  mouth when the corner of the mouth or cheek is stroked (rooting).  Increased sucking sounds, smacking lips, cooing, sighing, or squeaking.  Hand-to-mouth movements and sucking on fingers or hands.  Fussing or crying. Avoid introducing a pacifier to your baby in the first 4-6 weeks after your baby is born. After this time, you may choose to use a pacifier. Research has shown that pacifier use during the first year of a baby's life decreases the risk of sudden infant death syndrome (SIDS). Allow your baby to feed on each breast as long as he or she wants. When your baby unlatches or falls asleep while feeding from the first breast, offer the second breast. Because newborns are often sleepy in the first few weeks of life, you may need to awaken your baby to get him or her to feed. Breastfeeding times will vary from baby to baby. However, the  following rules can serve as a guide to help you make sure that your baby is properly fed:  Newborns (babies 56 weeks of age or younger) may breastfeed every 1-3 hours.  Newborns should not go without breastfeeding for longer than 3 hours during the day or 5 hours during the night.  You should breastfeed your baby a minimum of 8 times in a 24-hour period. Breast milk pumping     Pumping and storing breast milk allows you to make sure that your baby is exclusively fed your breast milk, even at times when you are unable to breastfeed. This is especially important if you go back to work while you are still breastfeeding, or if you are not able to be present during feedings. Your lactation consultant can help you find a method of pumping that works best for you and give you guidelines about how long it is safe to store breast milk. Caring for your breasts while you breastfeed Nipples can become dry, cracked, and sore while breastfeeding. The following recommendations can help keep your breasts moisturized and healthy:  Avoid using soap on your nipples.  Wear a supportive bra designed especially for nursing. Avoid wearing underwire-style bras or extremely tight bras (sports bras).  Air-dry your nipples for 3-4 minutes after each feeding.  Use only cotton bra pads to absorb leaked breast milk. Leaking of breast milk between feedings is normal.  Use lanolin on your nipples after breastfeeding. Lanolin helps to maintain your skin's normal moisture barrier. Pure lanolin is not harmful (not toxic) to your baby. You may also hand express a few drops of breast milk and gently massage that milk into your nipples and allow the milk to air-dry. In the first few weeks after giving birth, some women experience breast engorgement. Engorgement can make your breasts feel heavy, warm, and tender to the touch. Engorgement peaks within 3-5 days after you give birth. The following recommendations can help to ease  engorgement:  Completely empty your breasts while breastfeeding or pumping. You may want to start by applying warm, moist heat (in the shower or with warm, water-soaked hand towels) just before feeding or pumping. This increases circulation and helps the milk flow. If your baby does not completely empty your breasts while breastfeeding, pump any extra milk after he or she is finished.  Apply ice packs to your breasts immediately after breastfeeding or pumping, unless this is too uncomfortable for you. To do this: ? Put ice in a plastic bag. ? Place a towel between your skin and the bag. ? Leave the ice on for 20 minutes,  2-3 times a day.  Make sure that your baby is latched on and positioned properly while breastfeeding. If engorgement persists after 48 hours of following these recommendations, contact your health care provider or a Advertising copywriter. Overall health care recommendations while breastfeeding  Eat 3 healthy meals and 3 snacks every day. Well-nourished mothers who are breastfeeding need an additional 450-500 calories a day. You can meet this requirement by increasing the amount of a balanced diet that you eat.  Drink enough water to keep your urine pale yellow or clear.  Rest often, relax, and continue to take your prenatal vitamins to prevent fatigue, stress, and low vitamin and mineral levels in your body (nutrient deficiencies).  Do not use any products that contain nicotine or tobacco, such as cigarettes and e-cigarettes. Your baby may be harmed by chemicals from cigarettes that pass into breast milk and exposure to secondhand smoke. If you need help quitting, ask your health care provider.  Avoid alcohol.  Do not use illegal drugs or marijuana.  Talk with your health care provider before taking any medicines. These include over-the-counter and prescription medicines as well as vitamins and herbal supplements. Some medicines that may be harmful to your baby can pass  through breast milk.  It is possible to become pregnant while breastfeeding. If birth control is desired, ask your health care provider about options that will be safe while breastfeeding your baby. Where to find more information: [employer]: www.llli.org Contact a health care provider if:  You feel like you want to stop breastfeeding or have become frustrated with breastfeeding.  Your nipples are cracked or bleeding.  Your breasts are red, tender, or warm.  You have: ? Painful breasts or nipples. ? A swollen area on either breast. ? A fever or chills. ? Nausea or vomiting. ? Drainage other than breast milk from your nipples.  Your breasts do not become full before feedings by the fifth day after you give birth.  You feel sad and depressed.  Your baby is: ? Too sleepy to eat well. ? Having trouble sleeping. ? More than 26 week old and wetting fewer than 6 diapers in a 24-hour period. ? Not gaining weight by 34 days of age.  Your baby has fewer than 3 stools in a 24-hour period.  Your baby's skin or the white parts of his or her eyes become yellow. Get help right away if:  Your baby is overly tired (lethargic) and does not want to wake up and feed.  Your baby develops an unexplained fever. Summary  Breastfeeding offers many health benefits for infant and mothers.  Try to breastfeed your infant when he or she shows early signs of hunger.  Gently tickle or stroke your baby's lips with your finger or nipple to allow the baby to open his or her mouth. Bring the baby to your breast. Make sure that much of the areola is in your baby's mouth. Offer one side and burp the baby before you offer the other side.  Talk with your health care provider or lactation consultant if you have questions or you face problems as you breastfeed. This information is not intended to replace advice given to you by your health care provider. Make sure you discuss any questions you  have with your health care provider. Document Revised: 01/26/2018 Document Reviewed: 12/03/2016 Elsevier Patient Education  2020 [employer].

## 2020-11-20 ENCOUNTER — Ambulatory Visit: Payer: Medicaid Other

## 2020-11-20 LAB — CERVICOVAGINAL ANCILLARY ONLY
Bacterial Vaginitis (gardnerella): NEGATIVE
Candida Glabrata: NEGATIVE
Candida Vaginitis: NEGATIVE
Chlamydia: NEGATIVE
Comment: NEGATIVE
Comment: NEGATIVE
Comment: NEGATIVE
Comment: NEGATIVE
Comment: NEGATIVE
Comment: NORMAL
Neisseria Gonorrhea: NEGATIVE
Trichomonas: NEGATIVE

## 2020-11-20 LAB — COMPREHENSIVE METABOLIC PANEL
ALT: 15 IU/L (ref 0–32)
AST: 18 IU/L (ref 0–40)
Albumin/Globulin Ratio: 1.5 (ref 1.2–2.2)
Albumin: 3.5 g/dL — ABNORMAL LOW (ref 3.8–4.8)
Alkaline Phosphatase: 105 IU/L (ref 44–121)
BUN/Creatinine Ratio: 9 (ref 9–23)
BUN: 4 mg/dL — ABNORMAL LOW (ref 6–20)
Bilirubin Total: <0.2 mg/dL (ref 0.0–1.2)
CO2: 19 mmol/L — ABNORMAL LOW (ref 20–29)
Calcium: 8.7 mg/dL (ref 8.7–10.2)
Chloride: 105 mmol/L (ref 96–106)
Creatinine, Ser: 0.47 mg/dL — ABNORMAL LOW (ref 0.57–1.00)
GFR calc Af Amer: 151 mL/min/{1.73_m2} (ref >59)
GFR calc non Af Amer: 131 mL/min/{1.73_m2} (ref >59)
Globulin, Total: 2.4 g/dL (ref 1.5–4.5)
Glucose: 84 mg/dL (ref 65–99)
Potassium: 3.8 mmol/L (ref 3.5–5.2)
Sodium: 138 mmol/L (ref 134–144)
Total Protein: 5.9 g/dL — ABNORMAL LOW (ref 6.0–8.5)

## 2020-11-20 LAB — PROTEIN / CREATININE RATIO, URINE
Creatinine, Urine: 73.8 mg/dL
Protein, Ur: 13.4 mg/dL
Protein/Creat Ratio: 182 mg/g creat (ref 0–200)

## 2020-11-20 LAB — BILE ACIDS, TOTAL: Bile Acids Total: 2.7 umol/L (ref 0.0–10.0)

## 2020-11-21 ENCOUNTER — Ambulatory Visit: Payer: Medicaid Other | Attending: Obstetrics and Gynecology

## 2020-11-21 ENCOUNTER — Encounter: Payer: Self-pay | Admitting: *Deleted

## 2020-11-21 ENCOUNTER — Ambulatory Visit: Payer: Medicaid Other | Admitting: *Deleted

## 2020-11-21 ENCOUNTER — Other Ambulatory Visit: Payer: Self-pay

## 2020-11-21 DIAGNOSIS — O0990 Supervision of high risk pregnancy, unspecified, unspecified trimester: Secondary | ICD-10-CM | POA: Insufficient documentation

## 2020-11-21 DIAGNOSIS — Z3A34 34 weeks gestation of pregnancy: Secondary | ICD-10-CM

## 2020-11-21 DIAGNOSIS — N049 Nephrotic syndrome with unspecified morphologic changes: Secondary | ICD-10-CM | POA: Diagnosis not present

## 2020-11-21 DIAGNOSIS — I4891 Unspecified atrial fibrillation: Secondary | ICD-10-CM

## 2020-11-21 DIAGNOSIS — O34219 Maternal care for unspecified type scar from previous cesarean delivery: Secondary | ICD-10-CM

## 2020-11-21 DIAGNOSIS — O30043 Twin pregnancy, dichorionic/diamniotic, third trimester: Secondary | ICD-10-CM | POA: Insufficient documentation

## 2020-11-21 DIAGNOSIS — O361930 Maternal care for other isoimmunization, third trimester, not applicable or unspecified: Secondary | ICD-10-CM

## 2020-11-21 DIAGNOSIS — O99413 Diseases of the circulatory system complicating pregnancy, third trimester: Secondary | ICD-10-CM | POA: Diagnosis not present

## 2020-11-21 DIAGNOSIS — O09293 Supervision of pregnancy with other poor reproductive or obstetric history, third trimester: Secondary | ICD-10-CM

## 2020-11-26 ENCOUNTER — Ambulatory Visit: Payer: Medicaid Other

## 2020-11-28 ENCOUNTER — Ambulatory Visit: Payer: Medicaid Other | Admitting: Internal Medicine

## 2020-12-03 ENCOUNTER — Telehealth (HOSPITAL_COMMUNITY): Payer: Self-pay | Admitting: *Deleted

## 2020-12-03 ENCOUNTER — Encounter (HOSPITAL_COMMUNITY): Payer: Self-pay

## 2020-12-03 NOTE — Patient Instructions (Signed)
Michele Maldonado  12/03/2020   Your procedure is scheduled on:  12/15/2020  Arrive at 0745 at Entrance C on [address] at [facility]  and [employer]. You are invited to use the FREE valet parking or use the Visitor's parking deck.  Pick up the phone at the desk and dial [phone].  Call this number if you have problems the morning of surgery: [phone]  Remember:   Do not eat food:(After Midnight) Desps de medianoche.  Do not drink clear liquids: (After Midnight) Desps de medianoche.  Take these medicines the morning of surgery with A SIP OF WATER:  none   Do not wear jewelry, make-up or nail polish.  Do not wear lotions, powders, or perfumes. Do not wear deodorant.  Do not shave 48 hours prior to surgery.  Do not bring valuables to the hospital.  [facility] is not   responsible for any belongings or valuables brought to the hospital.  Contacts, dentures or bridgework may not be worn into surgery.  Leave suitcase in the car. After surgery it may be brought to your room.  For patients admitted to the hospital, checkout time is 11:00 AM the day of              discharge.      Please read over the following fact sheets that you were given:     Preparing for Surgery

## 2020-12-03 NOTE — Telephone Encounter (Signed)
Preadmission screen  

## 2020-12-04 ENCOUNTER — Telehealth (HOSPITAL_COMMUNITY): Payer: Self-pay | Admitting: *Deleted

## 2020-12-04 ENCOUNTER — Encounter: Payer: Medicaid Other | Admitting: Obstetrics & Gynecology

## 2020-12-04 ENCOUNTER — Ambulatory Visit: Payer: Medicaid Other

## 2020-12-04 NOTE — Telephone Encounter (Signed)
Preadmission screen  

## 2020-12-05 ENCOUNTER — Telehealth (HOSPITAL_COMMUNITY): Payer: Self-pay | Admitting: *Deleted

## 2020-12-05 ENCOUNTER — Inpatient Hospital Stay (HOSPITAL_BASED_OUTPATIENT_CLINIC_OR_DEPARTMENT_OTHER): Payer: Medicaid Other

## 2020-12-05 ENCOUNTER — Encounter (HOSPITAL_COMMUNITY): Payer: Self-pay | Admitting: Family Medicine

## 2020-12-05 ENCOUNTER — Encounter: Payer: Medicaid Other | Admitting: Obstetrics and Gynecology

## 2020-12-05 ENCOUNTER — Inpatient Hospital Stay (HOSPITAL_COMMUNITY)
Admission: AD | Admit: 2020-12-05 | Discharge: 2020-12-05 | Disposition: A | Discharge status: Home / Self Care | Payer: Medicaid Other | Attending: Family Medicine | Admitting: Family Medicine

## 2020-12-05 DIAGNOSIS — I480 Paroxysmal atrial fibrillation: Secondary | ICD-10-CM | POA: Insufficient documentation

## 2020-12-05 DIAGNOSIS — I341 Nonrheumatic mitral (valve) prolapse: Secondary | ICD-10-CM | POA: Diagnosis not present

## 2020-12-05 DIAGNOSIS — O368190 Decreased fetal movements, unspecified trimester, not applicable or unspecified: Secondary | ICD-10-CM

## 2020-12-05 DIAGNOSIS — Z3A36 36 weeks gestation of pregnancy: Secondary | ICD-10-CM | POA: Insufficient documentation

## 2020-12-05 DIAGNOSIS — O368130 Decreased fetal movements, third trimester, not applicable or unspecified: Secondary | ICD-10-CM

## 2020-12-05 DIAGNOSIS — O30043 Twin pregnancy, dichorionic/diamniotic, third trimester: Secondary | ICD-10-CM | POA: Diagnosis not present

## 2020-12-05 DIAGNOSIS — O368131 Decreased fetal movements, third trimester, fetus 1: Secondary | ICD-10-CM | POA: Diagnosis not present

## 2020-12-05 DIAGNOSIS — O2693 Pregnancy related conditions, unspecified, third trimester: Secondary | ICD-10-CM | POA: Diagnosis not present

## 2020-12-05 DIAGNOSIS — O34219 Maternal care for unspecified type scar from previous cesarean delivery: Secondary | ICD-10-CM | POA: Diagnosis not present

## 2020-12-05 DIAGNOSIS — O99413 Diseases of the circulatory system complicating pregnancy, third trimester: Secondary | ICD-10-CM | POA: Diagnosis not present

## 2020-12-05 DIAGNOSIS — O09293 Supervision of pregnancy with other poor reproductive or obstetric history, third trimester: Secondary | ICD-10-CM | POA: Diagnosis not present

## 2020-12-05 DIAGNOSIS — O361930 Maternal care for other isoimmunization, third trimester, not applicable or unspecified: Secondary | ICD-10-CM

## 2020-12-05 DIAGNOSIS — O368132 Decreased fetal movements, third trimester, fetus 2: Secondary | ICD-10-CM | POA: Diagnosis not present

## 2020-12-05 NOTE — Telephone Encounter (Signed)
Preadmission screen  

## 2020-12-05 NOTE — Discharge Instructions (Signed)
Fetal Movement Counts Patient Name: ________________________________________________ Patient Due Date: ____________________  What is a fetal movement count? A fetal movement count is the number of times that you feel your baby move during a certain amount of time. This may also be called a fetal kick count. A fetal movement count is recommended for every pregnant woman. You may be asked to start counting fetal movements as early as week 28 of your pregnancy. Pay attention to when your baby is most active. You may notice your baby's sleep and wake cycles. You may also notice things that make your baby move more. You should do a fetal movement count:  When your baby is normally most active.  At the same time each day. A good time to count movements is while you are resting, after having something to eat and drink. How do I count fetal movements? 1. Find a quiet, comfortable area. Sit, or lie down on your side. 2. Write down the date, the start time and stop time, and the number of movements that you felt between those two times. Take this information with you to your health care visits. 3. Write down your start time when you feel the first movement. 4. Count kicks, flutters, swishes, rolls, and jabs. You should feel at least 10 movements. 5. You may stop counting after you have felt 10 movements, or if you have been counting for 2 hours. Write down the stop time. 6. If you do not feel 10 movements in 2 hours, contact your health care provider for further instructions. Your health care provider may want to do additional tests to assess your baby's well-being. Contact a health care provider if:  You feel fewer than 10 movements in 2 hours.  Your baby is not moving like he or she usually does. Date: ____________ Start time: ____________ Stop time: ____________ Movements: ____________ Date: ____________ Start time: ____________ Stop time: ____________ Movements: ____________ Date: ____________  Start time: ____________ Stop time: ____________ Movements: ____________ Date: ____________ Start time: ____________ Stop time: ____________ Movements: ____________ Date: ____________ Start time: ____________ Stop time: ____________ Movements: ____________ Date: ____________ Start time: ____________ Stop time: ____________ Movements: ____________ Date: ____________ Start time: ____________ Stop time: ____________ Movements: ____________ Date: ____________ Start time: ____________ Stop time: ____________ Movements: ____________ Date: ____________ Start time: ____________ Stop time: ____________ Movements: ____________ This information is not intended to replace advice given to you by your health care provider. Make sure you discuss any questions you have with your health care provider. Document Revised: 06/21/2019 Document Reviewed: 06/21/2019 Elsevier Patient Education  2021 [employer]  

## 2020-12-05 NOTE — MAU Provider Note (Addendum)
History     CSN: [id number]  Arrival date and time: 12/05/20 1905   Event Date/Time   First Provider Initiated Contact with Patient 12/05/20 Feb 16, 2008      Chief Complaint  Patient presents with   Contractions   Decreased Fetal Movement   HPI This is a 35yo [id number] at 63w4dwith pregnancy complicated with Di/Di twins, PAF, kell isoimmunization, mitral valve prolapse. She comes in for decreased fetal movement for Baby A. Still feeling baby move, just less. Baby B moving fine. Also having some contractions. No leaking fluid. No palliating or provoking factors. Contractions are irregular.  Denies headache, blurred vision, abdominal pain.  OB History    Gravida  7   Para  6   Term  4   Preterm  2   AB  0   Living  5     SAB  0   IAB  0   Ectopic  0   Multiple  0   Live Births  6        Obstetric Comments  Child born in 204/03/2016deceased --- died at 556 monthsold, taken to ED unresponsive -- pt not ready to talk         Past Medical History:  Diagnosis Date   Anemia    [place]rial [place] [place]    Kell isoimmunization during pregnancy    a. per prior notes - h/o anti Kell alloimmunization.   MVP (mitral valve prolapse)    Thrombocytopenia (HCC)    Ventricular septal defect     Past Surgical History:  Procedure Laterality Date   CESAREAN SECTION N/A 12/29/2013   Procedure: CESAREAN SECTION;  Surgeon: MEmily Filbert MD;  [facility];  Service: Obstetrics;  Laterality: N/A;   REPAIR VAGINAL CUFF N/A 12/30/2013   Procedure: REPAIR VAGINAL CUFF;  Surgeon: MEmily Filbert MD;  [facility];  Service: Gynecology;  Laterality: N/A;  Insertion of Bakri Balloon    Family History  Problem Relation Age of Onset   Hypertension Maternal Grandmother    Diabetes Maternal Grandmother    Thyroid disease Mother    Atrial fibrillation Maternal Grandfather     Social History   Tobacco Use   Smoking status: Never Smoker   Smokeless tobacco: Never Used   Vaping Use   Vaping Use: Never used  Substance Use Topics   Alcohol use: Not Currently   Drug use: No    Types: Marijuana    Comment: former    Allergies: No Known Allergies  Medications Prior to Admission  Medication Sig Dispense Refill Last Dose   aspirin EC 81 MG tablet Take 81 mg by mouth daily. Swallow whole.      Blood Pressure Monitoring (BLOOD PRESSURE KIT) DEVI 1 Device by Does not apply route as needed. 1 each 0    cefadroxil (DURICEF) 500 MG capsule Take 1 capsule (500 mg total) by mouth 2 (two) times daily. (Patient not taking: Reported on 11/19/2020) 14 capsule 0    cyclobenzaprine (FLEXERIL) 10 MG tablet Take 1 tablet (10 mg total) by mouth 2 (two) times daily as needed for muscle spasms. (Patient not taking: Reported on 11/19/2020) 20 tablet 0    Elastic Bandages & Supports (COMFORT FIT MATERNITY SUPP LG) MISC 1 Device by Does not apply route as needed. 1 each 0    prenatal vitamin w/FE, FA (PRENATAL 1 + 1) 27-1 MG TABS tablet Take 1 tablet by mouth daily at 12 noon. 30 tablet 6  terconazole (TERAZOL 3) 0.8 % vaginal cream Place 1 applicator vaginally at bedtime. Apply nightly for three nights. (Patient not taking: Reported on 11/19/2020) 20 g 0     Review of Systems  All other systems reviewed and are negative.  Physical Exam   Blood pressure 131/76, pulse 96, temperature 98.7 F (37.1 C), temperature source Oral, resp. rate 18, last menstrual period 03/24/2020, SpO2 100 %, unknown if currently breastfeeding.  Physical Exam Vitals and nursing note reviewed. Exam conducted with a chaperone present.  Constitutional:      Appearance: Normal appearance.  Cardiovascular:     Pulses: Normal pulses.     Heart sounds: Normal heart sounds.  Pulmonary:     Effort: Pulmonary effort is normal.  Musculoskeletal:        General: Normal range of motion.  Skin:    General: Skin is warm and dry.     Capillary Refill: Capillary refill takes less than 2 seconds.   Neurological:     General: No focal deficit present.     Mental Status: She is alert.  Psychiatric:        Mood and Affect: Mood normal.        Behavior: Behavior normal.        Thought Content: Thought content normal.        Judgment: Judgment normal.    Dilation: 1 Effacement (%): 50 Cervical Position: Posterior Station: Ballotable Exam by:: Aadil Sur, DO   MAU Course  Procedures NST x2: both 130s, moderate variability, + accels, no decelerations.  MDM BPP 8/10 for both babies - 2 point off for nonsustained breathing.  Assessment and Plan     ICD-10-CM   1. [redacted] weeks gestation of pregnancy  Z3A.36   2. Decreased fetal movement  O36.8190 [place] MFM FETAL BPP WO NON STRESS    [place] MFM FETAL BPP WO NON STRESS    [place] MFM FETAL BPP WO NST ADDL GESTATION    [place] MFM FETAL BPP WO NST ADDL GESTATION  3. Decreased fetal movements in third trimester, fetus 1 of multiple gestation  O36.8131    Decreased fetal movement improved. Discussed patient with Dr Rosana Hoes - ok to discharge.  Overall, BPP reassuring. Discussed results with patient. Kick count discussed. Offered delivery at 37 weeks - patient would like to reschedule to that time.  Return precautions given.  Truett Mainland 12/05/2020, 8:10 PM

## 2020-12-05 NOTE — MAU Note (Addendum)
Pt in c/o of UC all week, today, pain is a little bit stronger mostly on her left side. Also c/o of pelvis pressure and DFM of baby B. Pt c/o of leaking of fluid, for almost a week, asked pt to clarify if discharge or possible ROM, pt stated sometimes its discharge and sometimes its leaking of fluid. When asked her last appt, pt stated she missed this week. Scheduled for repeat c/s on 1/31.

## 2020-12-06 ENCOUNTER — Encounter (HOSPITAL_COMMUNITY): Payer: Self-pay

## 2020-12-06 NOTE — Patient Instructions (Signed)
Jubilee Caisse  12/06/2020   Your procedure is scheduled on:  12/09/2020  Arrive at 1045 at Entrance C on [employer] at [facility]  and [employer]. You are invited to use the FREE valet parking or use the Visitor's parking deck.  Pick up the phone at the desk and dial [phone].  Call this number if you have problems the morning of surgery: [phone]  Remember:   Do not eat food:(After Midnight) Desps de medianoche.  Do not drink clear liquids: (After Midnight) Desps de medianoche.  Take these medicines the morning of surgery with A SIP OF WATER:  none   Do not wear jewelry, make-up or nail polish.  Do not wear lotions, powders, or perfumes. Do not wear deodorant.  Do not shave 48 hours prior to surgery.  Do not bring valuables to the hospital.  [facility] is not   responsible for any belongings or valuables brought to the hospital.  Contacts, dentures or bridgework may not be worn into surgery.  Leave suitcase in the car. After surgery it may be brought to your room.  For patients admitted to the hospital, checkout time is 11:00 AM the day of              discharge.      Please read over the following fact sheets that you were given:     Preparing for Surgery

## 2020-12-08 ENCOUNTER — Other Ambulatory Visit (HOSPITAL_COMMUNITY)
Admission: RE | Admit: 2020-12-08 | Discharge: 2020-12-08 | Disposition: A | Discharge status: Home / Self Care | Payer: Medicaid Other | Source: Ambulatory Visit | Attending: Family Medicine | Admitting: Family Medicine

## 2020-12-08 ENCOUNTER — Other Ambulatory Visit: Payer: Self-pay

## 2020-12-08 ENCOUNTER — Encounter (HOSPITAL_COMMUNITY)
Admission: RE | Admit: 2020-12-08 | Discharge: 2020-12-08 | Disposition: A | Discharge status: Home / Self Care | Payer: Medicaid Other | Source: Ambulatory Visit | Attending: Family Medicine | Admitting: Family Medicine

## 2020-12-08 DIAGNOSIS — Z20822 Contact with and (suspected) exposure to covid-19: Secondary | ICD-10-CM | POA: Insufficient documentation

## 2020-12-08 DIAGNOSIS — Z01812 Encounter for preprocedural laboratory examination: Secondary | ICD-10-CM | POA: Insufficient documentation

## 2020-12-08 LAB — COMPREHENSIVE METABOLIC PANEL
ALT: 15 U/L (ref 0–44)
AST: 23 U/L (ref 15–41)
Albumin: 2.5 g/dL — ABNORMAL LOW (ref 3.5–5.0)
Alkaline Phosphatase: 112 U/L (ref 38–126)
Anion gap: 12 (ref 5–15)
BUN: <5 mg/dL — ABNORMAL LOW (ref 6–20)
CO2: 19 mmol/L — ABNORMAL LOW (ref 22–32)
Calcium: 8.4 mg/dL — ABNORMAL LOW (ref 8.9–10.3)
Chloride: 105 mmol/L (ref 98–111)
Creatinine, Ser: 0.68 mg/dL (ref 0.44–1.00)
GFR, Estimated: >60 mL/min (ref >60)
Glucose, Bld: 133 mg/dL — ABNORMAL HIGH (ref 70–99)
Potassium: 3.2 mmol/L — ABNORMAL LOW (ref 3.5–5.1)
Sodium: 136 mmol/L (ref 135–145)
Total Bilirubin: 0.8 mg/dL (ref 0.3–1.2)
Total Protein: 5.5 g/dL — ABNORMAL LOW (ref 6.5–8.1)

## 2020-12-08 LAB — CBC
HCT: 34.3 % — ABNORMAL LOW (ref 36.0–46.0)
Hemoglobin: 11.4 g/dL — ABNORMAL LOW (ref 12.0–15.0)
MCH: 30.7 pg (ref 26.0–34.0)
MCHC: 33.2 g/dL (ref 30.0–36.0)
MCV: 92.5 fL (ref 80.0–100.0)
Platelets: 122 10*3/uL — ABNORMAL LOW (ref 150–400)
RBC: 3.71 MIL/uL — ABNORMAL LOW (ref 3.87–5.11)
RDW: 15.1 % (ref 11.5–15.5)
WBC: 8.9 10*3/uL (ref 4.0–10.5)
nRBC: 0 % (ref 0.0–0.2)

## 2020-12-08 LAB — RPR: RPR Ser Ql: NONREACTIVE

## 2020-12-08 LAB — SARS CORONAVIRUS 2 (TAT 6-24 HRS): SARS Coronavirus 2: NEGATIVE

## 2020-12-09 ENCOUNTER — Inpatient Hospital Stay (HOSPITAL_COMMUNITY)
Admission: RE | Admit: 2020-12-09 | Discharge: 2020-12-11 | DRG: 786 | Disposition: A | Discharge status: Home / Self Care | Payer: Medicaid Other | Attending: Family Medicine | Admitting: Family Medicine

## 2020-12-09 ENCOUNTER — Encounter (HOSPITAL_COMMUNITY): Payer: Self-pay | Admitting: Family Medicine

## 2020-12-09 ENCOUNTER — Other Ambulatory Visit: Payer: Self-pay

## 2020-12-09 ENCOUNTER — Inpatient Hospital Stay (HOSPITAL_COMMUNITY): Payer: Medicaid Other | Admitting: Anesthesiology

## 2020-12-09 ENCOUNTER — Encounter (HOSPITAL_COMMUNITY)
Admission: RE | Disposition: A | Discharge status: Home / Self Care | Payer: Self-pay | Source: Home / Self Care | Attending: Family Medicine

## 2020-12-09 DIAGNOSIS — Z30014 Encounter for initial prescription of intrauterine contraceptive device: Secondary | ICD-10-CM

## 2020-12-09 DIAGNOSIS — O9912 Other diseases of the blood and blood-forming organs and certain disorders involving the immune mechanism complicating childbirth: Secondary | ICD-10-CM | POA: Diagnosis present

## 2020-12-09 DIAGNOSIS — I4891 Unspecified atrial fibrillation: Secondary | ICD-10-CM | POA: Diagnosis present

## 2020-12-09 DIAGNOSIS — O9902 Anemia complicating childbirth: Secondary | ICD-10-CM | POA: Diagnosis not present

## 2020-12-09 DIAGNOSIS — D696 Thrombocytopenia, unspecified: Secondary | ICD-10-CM | POA: Diagnosis present

## 2020-12-09 DIAGNOSIS — O30043 Twin pregnancy, dichorionic/diamniotic, third trimester: Secondary | ICD-10-CM | POA: Diagnosis present

## 2020-12-09 DIAGNOSIS — D649 Anemia, unspecified: Secondary | ICD-10-CM | POA: Diagnosis present

## 2020-12-09 DIAGNOSIS — I341 Nonrheumatic mitral (valve) prolapse: Secondary | ICD-10-CM | POA: Diagnosis present

## 2020-12-09 DIAGNOSIS — O43893 Other placental disorders, third trimester: Secondary | ICD-10-CM | POA: Diagnosis not present

## 2020-12-09 DIAGNOSIS — Z30430 Encounter for insertion of intrauterine contraceptive device: Secondary | ICD-10-CM

## 2020-12-09 DIAGNOSIS — Z20822 Contact with and (suspected) exposure to covid-19: Secondary | ICD-10-CM | POA: Diagnosis present

## 2020-12-09 DIAGNOSIS — Z3A37 37 weeks gestation of pregnancy: Secondary | ICD-10-CM

## 2020-12-09 DIAGNOSIS — O322XX1 Maternal care for transverse and oblique lie, fetus 1: Secondary | ICD-10-CM

## 2020-12-09 DIAGNOSIS — O34211 Maternal care for low transverse scar from previous cesarean delivery: Secondary | ICD-10-CM

## 2020-12-09 DIAGNOSIS — Z98891 History of uterine scar from previous surgery: Secondary | ICD-10-CM

## 2020-12-09 DIAGNOSIS — Z87441 Personal history of nephrotic syndrome: Secondary | ICD-10-CM | POA: Diagnosis not present

## 2020-12-09 DIAGNOSIS — O9942 Diseases of the circulatory system complicating childbirth: Secondary | ICD-10-CM | POA: Diagnosis not present

## 2020-12-09 DIAGNOSIS — D6959 Other secondary thrombocytopenia: Secondary | ICD-10-CM | POA: Diagnosis not present

## 2020-12-09 DIAGNOSIS — O322XX2 Maternal care for transverse and oblique lie, fetus 2: Secondary | ICD-10-CM

## 2020-12-09 SURGERY — Surgical Case
Anesthesia: Spinal

## 2020-12-09 MED ORDER — DIPHENHYDRAMINE HCL 25 MG PO CAPS: 25.0000 mg | ORAL_CAPSULE | Freq: Four times a day (QID) | ORAL | Status: DC | PRN

## 2020-12-09 MED ORDER — PHENYLEPHRINE HCL (PRESSORS) 10 MG/ML IV SOLN
INTRAVENOUS | Status: DC | PRN
  Administered 2020-12-09: 80 ug via INTRAVENOUS
  Administered 2020-12-09: 120 ug via INTRAVENOUS

## 2020-12-09 MED ORDER — KETOROLAC TROMETHAMINE 30 MG/ML IJ SOLN
30.0000 mg | Freq: Four times a day (QID) | INTRAMUSCULAR | Status: AC
  Administered 2020-12-09 – 2020-12-10 (×3): 30 mg via INTRAVENOUS
  Filled 2020-12-09 (×3): qty 1

## 2020-12-09 MED ORDER — DEXAMETHASONE SODIUM PHOSPHATE 4 MG/ML IJ SOLN
INTRAMUSCULAR | Status: DC | PRN
  Administered 2020-12-09: 8 mg via INTRAVENOUS

## 2020-12-09 MED ORDER — SOD CITRATE-CITRIC ACID 500-334 MG/5ML PO SOLN
30.0000 mL | ORAL | Status: AC
  Administered 2020-12-09: 30 mL via ORAL

## 2020-12-09 MED ORDER — METOCLOPRAMIDE HCL 5 MG/ML IJ SOLN
INTRAMUSCULAR | Status: DC | PRN
  Administered 2020-12-09: 10 mg via INTRAVENOUS

## 2020-12-09 MED ORDER — SIMETHICONE 80 MG PO CHEW
80.0000 mg | CHEWABLE_TABLET | Freq: Three times a day (TID) | ORAL | Status: DC
  Administered 2020-12-09 – 2020-12-11 (×5): 80 mg via ORAL
  Filled 2020-12-09 (×5): qty 1

## 2020-12-09 MED ORDER — SENNOSIDES-DOCUSATE SODIUM 8.6-50 MG PO TABS
2.0000 | ORAL_TABLET | ORAL | Status: DC
  Administered 2020-12-09 – 2020-12-10 (×2): 2 via ORAL
  Filled 2020-12-09 (×2): qty 2

## 2020-12-09 MED ORDER — NALOXONE HCL 4 MG/10ML IJ SOLN
1.0000 ug/kg/h | INTRAVENOUS | Status: DC | PRN
  Filled 2020-12-09: qty 5

## 2020-12-09 MED ORDER — FENTANYL CITRATE (PF) 100 MCG/2ML IJ SOLN
INTRAMUSCULAR | Status: DC | PRN
  Administered 2020-12-09: 85 ug via INTRATHECAL
  Administered 2020-12-09: 15 ug via INTRATHECAL

## 2020-12-09 MED ORDER — OXYTOCIN-SODIUM CHLORIDE 30-0.9 UT/500ML-% IV SOLN: 2.5000 [IU]/h | INTRAVENOUS | Status: AC

## 2020-12-09 MED ORDER — CEFAZOLIN SODIUM-DEXTROSE 2-3 GM-%(50ML) IV SOLR
INTRAVENOUS | Status: DC | PRN
  Administered 2020-12-09: 2 g via INTRAVENOUS

## 2020-12-09 MED ORDER — DIPHENHYDRAMINE HCL 25 MG PO CAPS: 25.0000 mg | ORAL_CAPSULE | ORAL | Status: DC | PRN

## 2020-12-09 MED ORDER — LEVONORGESTREL 19.5 MCG/DAY IU IUD
INTRAUTERINE_SYSTEM | Freq: Once | INTRAUTERINE | Status: AC
  Administered 2020-12-09: 1 via INTRAUTERINE

## 2020-12-09 MED ORDER — IBUPROFEN 800 MG PO TABS
800.0000 mg | ORAL_TABLET | Freq: Four times a day (QID) | ORAL | Status: DC
  Administered 2020-12-10 – 2020-12-11 (×5): 800 mg via ORAL
  Filled 2020-12-09 (×5): qty 1

## 2020-12-09 MED ORDER — ONDANSETRON HCL 4 MG/2ML IJ SOLN
INTRAMUSCULAR | Status: AC
  Filled 2020-12-09: qty 2

## 2020-12-09 MED ORDER — SCOPOLAMINE 1 MG/3DAYS TD PT72
MEDICATED_PATCH | TRANSDERMAL | Status: AC
  Filled 2020-12-09: qty 1

## 2020-12-09 MED ORDER — WITCH HAZEL-GLYCERIN EX PADS: 1.0000 "application " | MEDICATED_PAD | CUTANEOUS | Status: DC | PRN

## 2020-12-09 MED ORDER — CEFAZOLIN SODIUM-DEXTROSE 2-4 GM/100ML-% IV SOLN: 2.0000 g | INTRAVENOUS | Status: DC

## 2020-12-09 MED ORDER — NALOXONE HCL 0.4 MG/ML IJ SOLN: 0.4000 mg | INTRAMUSCULAR | Status: DC | PRN

## 2020-12-09 MED ORDER — LEVONORGESTREL 19.5 MCG/DAY IU IUD: INTRAUTERINE_SYSTEM | Freq: Once | INTRAUTERINE | Status: DC

## 2020-12-09 MED ORDER — DEXAMETHASONE SODIUM PHOSPHATE 4 MG/ML IJ SOLN
INTRAMUSCULAR | Status: AC
  Filled 2020-12-09: qty 2

## 2020-12-09 MED ORDER — DEXMEDETOMIDINE (PRECEDEX) IN NS 20 MCG/5ML (4 MCG/ML) IV SYRINGE
PREFILLED_SYRINGE | INTRAVENOUS | Status: AC
  Filled 2020-12-09: qty 5

## 2020-12-09 MED ORDER — MORPHINE SULFATE (PF) 0.5 MG/ML IJ SOLN
INTRAMUSCULAR | Status: DC | PRN
  Administered 2020-12-09: .15 mg via INTRATHECAL

## 2020-12-09 MED ORDER — COCONUT OIL OIL: 1.0000 "application " | TOPICAL_OIL | Status: DC | PRN

## 2020-12-09 MED ORDER — LACTATED RINGERS IV SOLN: INTRAVENOUS | Status: DC | PRN

## 2020-12-09 MED ORDER — SOD CITRATE-CITRIC ACID 500-334 MG/5ML PO SOLN
ORAL | Status: AC
  Filled 2020-12-09: qty 30

## 2020-12-09 MED ORDER — ONDANSETRON HCL 4 MG/2ML IJ SOLN: 4.0000 mg | Freq: Three times a day (TID) | INTRAMUSCULAR | Status: DC | PRN

## 2020-12-09 MED ORDER — NALBUPHINE HCL 10 MG/ML IJ SOLN
5.0000 mg | INTRAMUSCULAR | Status: DC | PRN
  Administered 2020-12-10: 5 mg via INTRAVENOUS
  Filled 2020-12-09: qty 1

## 2020-12-09 MED ORDER — TETANUS-DIPHTH-ACELL PERTUSSIS 5-2.5-18.5 LF-MCG/0.5 IM SUSY: 0.5000 mL | PREFILLED_SYRINGE | Freq: Once | INTRAMUSCULAR | Status: DC

## 2020-12-09 MED ORDER — MORPHINE SULFATE (PF) 0.5 MG/ML IJ SOLN
INTRAMUSCULAR | Status: AC
  Filled 2020-12-09: qty 10

## 2020-12-09 MED ORDER — BUPIVACAINE IN DEXTROSE 0.75-8.25 % IT SOLN
INTRATHECAL | Status: DC | PRN
  Administered 2020-12-09: 1.6 mL via INTRATHECAL

## 2020-12-09 MED ORDER — LACTATED RINGERS IV SOLN: INTRAVENOUS | Status: DC

## 2020-12-09 MED ORDER — METOCLOPRAMIDE HCL 5 MG/ML IJ SOLN
INTRAMUSCULAR | Status: AC
  Filled 2020-12-09: qty 2

## 2020-12-09 MED ORDER — PHENYLEPHRINE HCL-NACL 20-0.9 MG/250ML-% IV SOLN
INTRAVENOUS | Status: AC
  Filled 2020-12-09: qty 250

## 2020-12-09 MED ORDER — DEXMEDETOMIDINE (PRECEDEX) IN NS 20 MCG/5ML (4 MCG/ML) IV SYRINGE
PREFILLED_SYRINGE | INTRAVENOUS | Status: DC | PRN
  Administered 2020-12-09: 8 ug via INTRAVENOUS

## 2020-12-09 MED ORDER — ZOLPIDEM TARTRATE 5 MG PO TABS: 5.0000 mg | ORAL_TABLET | Freq: Every evening | ORAL | Status: DC | PRN

## 2020-12-09 MED ORDER — DIPHENHYDRAMINE HCL 50 MG/ML IJ SOLN: 12.5000 mg | INTRAMUSCULAR | Status: DC | PRN

## 2020-12-09 MED ORDER — MENTHOL 3 MG MT LOZG: 1.0000 | LOZENGE | OROMUCOSAL | Status: DC | PRN

## 2020-12-09 MED ORDER — DIBUCAINE (PERIANAL) 1 % EX OINT: 1.0000 "application " | TOPICAL_OINTMENT | CUTANEOUS | Status: DC | PRN

## 2020-12-09 MED ORDER — SCOPOLAMINE 1 MG/3DAYS TD PT72
1.0000 | MEDICATED_PATCH | Freq: Once | TRANSDERMAL | Status: DC
  Administered 2020-12-09: 1.5 mg via TRANSDERMAL
  Filled 2020-12-09: qty 1

## 2020-12-09 MED ORDER — NALBUPHINE HCL 10 MG/ML IJ SOLN: 5.0000 mg | Freq: Once | INTRAMUSCULAR | Status: DC | PRN

## 2020-12-09 MED ORDER — PHENYLEPHRINE HCL-NACL 20-0.9 MG/250ML-% IV SOLN
INTRAVENOUS | Status: DC | PRN
  Administered 2020-12-09: 60 ug/min via INTRAVENOUS

## 2020-12-09 MED ORDER — FENTANYL CITRATE (PF) 100 MCG/2ML IJ SOLN
INTRAMUSCULAR | Status: AC
  Filled 2020-12-09: qty 2

## 2020-12-09 MED ORDER — CEFAZOLIN SODIUM-DEXTROSE 2-4 GM/100ML-% IV SOLN
INTRAVENOUS | Status: AC
  Filled 2020-12-09: qty 100

## 2020-12-09 MED ORDER — ACETAMINOPHEN 500 MG PO TABS
1000.0000 mg | ORAL_TABLET | Freq: Four times a day (QID) | ORAL | Status: DC
  Administered 2020-12-09 – 2020-12-10 (×6): 1000 mg via ORAL
  Filled 2020-12-09 (×7): qty 2

## 2020-12-09 MED ORDER — ENOXAPARIN SODIUM 40 MG/0.4ML ~~LOC~~ SOLN
40.0000 mg | SUBCUTANEOUS | Status: DC
  Administered 2020-12-10 – 2020-12-11 (×2): 40 mg via SUBCUTANEOUS
  Filled 2020-12-09 (×2): qty 0.4

## 2020-12-09 MED ORDER — OXYCODONE HCL 5 MG PO TABS: 5.0000 mg | ORAL_TABLET | ORAL | Status: DC | PRN

## 2020-12-09 MED ORDER — SODIUM CHLORIDE 0.9% FLUSH: 3.0000 mL | INTRAVENOUS | Status: DC | PRN

## 2020-12-09 MED ORDER — PHENYLEPHRINE 40 MCG/ML (10ML) SYRINGE FOR IV PUSH (FOR BLOOD PRESSURE SUPPORT)
PREFILLED_SYRINGE | INTRAVENOUS | Status: AC
  Filled 2020-12-09: qty 10

## 2020-12-09 MED ORDER — KETOROLAC TROMETHAMINE 30 MG/ML IJ SOLN
INTRAMUSCULAR | Status: DC | PRN
  Administered 2020-12-09: 30 mg via INTRAVENOUS

## 2020-12-09 MED ORDER — ONDANSETRON HCL 4 MG/2ML IJ SOLN
INTRAMUSCULAR | Status: DC | PRN
  Administered 2020-12-09: 4 mg via INTRAVENOUS

## 2020-12-09 MED ORDER — TRANEXAMIC ACID-NACL 1000-0.7 MG/100ML-% IV SOLN
INTRAVENOUS | Status: DC | PRN
  Administered 2020-12-09: 1000 mg via INTRAVENOUS

## 2020-12-09 MED ORDER — MEASLES, MUMPS & RUBELLA VAC IJ SOLR: 0.5000 mL | Freq: Once | INTRAMUSCULAR | Status: DC

## 2020-12-09 MED ORDER — OXYTOCIN-SODIUM CHLORIDE 30-0.9 UT/500ML-% IV SOLN
INTRAVENOUS | Status: AC
  Filled 2020-12-09: qty 500

## 2020-12-09 MED ORDER — KETOROLAC TROMETHAMINE 30 MG/ML IJ SOLN
INTRAMUSCULAR | Status: AC
  Filled 2020-12-09: qty 1

## 2020-12-09 MED ORDER — SIMETHICONE 80 MG PO CHEW: 80.0000 mg | CHEWABLE_TABLET | ORAL | Status: DC | PRN

## 2020-12-09 MED ORDER — POVIDONE-IODINE 10 % EX SWAB
2.0000 "application " | Freq: Once | CUTANEOUS | Status: AC
  Administered 2020-12-09: 2 via TOPICAL

## 2020-12-09 MED ORDER — OXYTOCIN-SODIUM CHLORIDE 30-0.9 UT/500ML-% IV SOLN
INTRAVENOUS | Status: DC | PRN
  Administered 2020-12-09: 300 mL via INTRAVENOUS

## 2020-12-09 MED ORDER — PRENATAL MULTIVITAMIN CH
1.0000 | ORAL_TABLET | Freq: Every day | ORAL | Status: DC
  Administered 2020-12-09 – 2020-12-11 (×3): 1 via ORAL
  Filled 2020-12-09 (×3): qty 1

## 2020-12-09 MED ORDER — LEVONORGESTREL 19.5 MCG/DAY IU IUD
INTRAUTERINE_SYSTEM | INTRAUTERINE | Status: AC
  Filled 2020-12-09: qty 1

## 2020-12-09 MED ORDER — NALBUPHINE HCL 10 MG/ML IJ SOLN
5.0000 mg | INTRAMUSCULAR | Status: DC | PRN
Start: 2020-12-09 — End: 2020-12-11

## 2020-12-09 SURGICAL SUPPLY — 35 items
BENZOIN TINCTURE PRP APPL 2/3 (GAUZE/BANDAGES/DRESSINGS) ×2 IMPLANT
CHLORAPREP W/TINT 26ML (MISCELLANEOUS) ×2 IMPLANT
CLAMP CORD UMBIL (MISCELLANEOUS) IMPLANT
CLOTH BEACON ORANGE TIMEOUT ST (SAFETY) ×2 IMPLANT
DRSG OPSITE POSTOP 4X10 (GAUZE/BANDAGES/DRESSINGS) ×2 IMPLANT
ELECT REM PT RETURN 9FT ADLT (ELECTROSURGICAL) ×2
ELECTRODE REM PT RTRN 9FT ADLT (ELECTROSURGICAL) ×1 IMPLANT
EXTRACTOR VACUUM M CUP 4 TUBE (SUCTIONS) IMPLANT
GAUZE SPONGE 4X4 12PLY STRL LF (GAUZE/BANDAGES/DRESSINGS) ×4 IMPLANT
GLOVE BIOGEL PI IND STRL 7.0 (GLOVE) ×2 IMPLANT
GLOVE BIOGEL PI IND STRL 7.5 (GLOVE) ×2 IMPLANT
GLOVE BIOGEL PI INDICATOR 7.0 (GLOVE) ×2
GLOVE BIOGEL PI INDICATOR 7.5 (GLOVE) ×2
GLOVE ECLIPSE 7.5 STRL STRAW (GLOVE) ×2 IMPLANT
GOWN STRL REUS W/TWL LRG LVL3 (GOWN DISPOSABLE) ×6 IMPLANT
HEMOSTAT ARISTA ABSORB 3G PWDR (HEMOSTASIS) ×2 IMPLANT
KIT ABG SYR 3ML LUER SLIP (SYRINGE) IMPLANT
NEEDLE HYPO 25X5/8 SAFETYGLIDE (NEEDLE) IMPLANT
NS IRRIG 1000ML POUR BTL (IV SOLUTION) ×2 IMPLANT
PACK C SECTION WH (CUSTOM PROCEDURE TRAY) ×2 IMPLANT
PAD ABD 7.5X8 STRL (GAUZE/BANDAGES/DRESSINGS) ×2 IMPLANT
PAD OB MATERNITY 4.3X12.25 (PERSONAL CARE ITEMS) ×2 IMPLANT
PENCIL SMOKE EVAC W/HOLSTER (ELECTROSURGICAL) ×2 IMPLANT
RTRCTR C-SECT PINK 25CM LRG (MISCELLANEOUS) ×2 IMPLANT
STRIP CLOSURE SKIN 1/2X4 (GAUZE/BANDAGES/DRESSINGS) ×2 IMPLANT
SUT PLAIN 2 0 (SUTURE) ×1
SUT PLAIN ABS 2-0 CT1 27XMFL (SUTURE) ×1 IMPLANT
SUT VIC AB 0 CTX 36 (SUTURE) ×3
SUT VIC AB 0 CTX36XBRD ANBCTRL (SUTURE) ×3 IMPLANT
SUT VIC AB 2-0 CT1 27 (SUTURE) ×1
SUT VIC AB 2-0 CT1 TAPERPNT 27 (SUTURE) ×1 IMPLANT
SUT VIC AB 4-0 KS 27 (SUTURE) ×2 IMPLANT
TOWEL OR 17X24 6PK STRL BLUE (TOWEL DISPOSABLE) ×2 IMPLANT
TRAY FOLEY W/BAG SLVR 14FR LF (SET/KITS/TRAYS/PACK) ×2 IMPLANT
WATER STERILE IRR 1000ML POUR (IV SOLUTION) ×2 IMPLANT

## 2020-12-09 NOTE — Lactation Note (Incomplete)
This note was copied from a baby's chart. Lactation Consultation Note  Patient Name: Michele Maldonado [id number] Date: 12/09/2020 Reason for consult: Initial assessment;Early term 37-38.6wks;Multiple gestation Age:33 hours  Maternal Data Formula Feeding for Exclusion: Yes Reason for exclusion: Mother's choice to formula and breast feed on admission Has patient been taught Hand Expression?: Yes Does the patient have breastfeeding experience prior to this delivery?: Yes  Feeding Feeding Type: Breast Fed  LATCH Score Latch: Grasps breast easily, tongue down, lips flanged, rhythmical sucking.  Audible Swallowing: Spontaneous and intermittent  Type of Nipple: Everted at rest and after stimulation  Comfort (Breast/Nipple): Soft / non-tender  Hold (Positioning): No assistance needed to correctly position infant at breast.  LATCH Score: 10  Interventions    Lactation Tools Discussed/Used Tools: Flanges Flange Size: 24 WIC Program: Yes Pump Education: Setup, frequency, and cleaning;Milk Storage Initiated by:: [name] Date initiated:: 12/09/20  LC and LC Student entered the room to see P7 Mom after a C-section for a set of twins that were 2 hours old. Twin [name] was STS at, breastfeeding upon arrival, twin [name] was in the bassinet sleeping.   [facility] Student reviewed initial consult questions; Mom expressed having previous experience with hand expression, breastfeeding, and infant care. She expressed primary concerns around babies being twins and how to feed multiples. Mom intends to breastfeed and bottlefeed in hopes to have support. Dad was present in the room and was also supportive.   [facility] Student reviewed resources and information around late preterm babies and how they may have to have supplementation based on how they remove milk from the breasts. Also discussed keeping babies STS as much as possible and feeding them based on cues.   LC set up the breast pump and  recommended pumping and using pumped milk to to supplement immediately come up. When checking baby A's latch, it was deep and could hear swallows. LC also discussed milk storage and cleaning pumping parts.  [facility] Student also discussed self-care and recovery from birth; hydration and eating food enough is important to postpartum as well as her breastfeeding journey.   [facility] Student also encouraged mom to call her nurse and request lactation as needed; also gave her resources related to calling lactation after she went home.   Consult Status Consult Status: Follow-up Date: 12/10/20 Follow-up type: In-patient    [name] 12/09/2020, 2:28 PM

## 2020-12-09 NOTE — Op Note (Signed)
Michele Maldonado PROCEDURE DATE: 12/09/2020  PREOPERATIVE DIAGNOSIS: Intrauterine pregnancy at  [redacted]w[redacted]d weeks gestation; twin pregnancy, malposition, elective repeat  POSTOPERATIVE DIAGNOSIS: The same  PROCEDURE: Repeat Low Transverse Cesarean Section with IUD insertion  SURGEON:  Dr. Candelaria Celeste  ASSISTANT: Dr Elita Quick  INDICATIONS: Michele Maldonado is a 35 y.o. [id number] at [redacted]w[redacted]d scheduled for cesarean section secondary to malposition, twin pregnancy, malposition.  The risks of cesarean section discussed with the patient included but were not limited to: bleeding which may require transfusion or reoperation; infection which may require antibiotics; injury to bowel, bladder, ureters or other surrounding organs; injury to the fetus; need for additional procedures including hysterectomy in the event of a life-threatening hemorrhage; placental abnormalities wth subsequent pregnancies, incisional problems, thromboembolic phenomenon and other postoperative/anesthesia complications. The patient concurred with the proposed plan, giving informed written consent for the procedure.    FINDINGS:  Baby A: Viable female infant in transverse presentation.  Apgars 9 and 9, weight 7 pounds 10 ounces.  Clear amniotic fluid. Baby B: Viable female infant in transverse presentation.  Apgars 9 and 9, weight, 6 pounds and 5.8 ounces.  Clear amniotic fluid. Intact placenta, three vessel cord.  Normal uterus, fallopian tubes and ovaries bilaterally.  ANESTHESIA:    Spinal INTRAVENOUS FLUIDS:1000 ml ESTIMATED BLOOD LOSS: 520 ml URINE OUTPUT:  300 ml SPECIMENS: Placenta sent to pathology COMPLICATIONS: None immediate  PROCEDURE IN DETAIL:  The patient received intravenous antibiotics and had sequential compression devices applied to her lower extremities while in the preoperative area.  She was then taken to the operating room where spinal anesthesia was administered (epidural anesthesia was dosed up to surgical level)  and was found to be adequate. She was then placed in a dorsal supine position with a leftward tilt, and prepped and draped in a sterile manner.  A foley catheter was placed into her bladder and attached to constant gravity, which drained clear fluid throughout.  After an adequate timeout was performed, a Pfannenstiel skin incision was made with scalpel and carried through to the underlying layer of fascia. The fascia was incised in the midline and this incision was extended bilaterally using the Mayo scissors. Kocher clamps were applied to the superior aspect of the fascial incision and the underlying rectus muscles were dissected off bluntly. A similar process was carried out on the inferior aspect of the facial incision. The rectus muscles were separated in the midline bluntly and the peritoneum was entered bluntly. An Alexis retractor was placed to aid in visualization of the uterus.    Attention was turned to the lower uterine segment where a transverse hysterotomy was made with a scalpel and extended bilaterally bluntly. The infant was successfully delivered , and, after a minute of delay, cord was clamped and cut and infant was handed over to awaiting neonatology team. Baby B was delivered in a similar fashion. Uterine massage was then administered and the placenta delivered intact with three-vessel cord. The uterus was then cleared of clot and debris.    An Liletta IUD was removed from the packaging and from the inserter device. The strings were trimmed to approximately 10cm and placed inside the uterus. A pair of Kelly's were used to push the strings through the cervix, then subsequently passed off.   The hysterotomy was closed with 0 Vicryl in a running locked fashion, and an imbricating layer was also placed with a 0 Vicryl. Overall, excellent hemostasis was noted. The abdomen and the pelvis were cleared of all clot  and debris and the Jon Gills was removed. Hemostasis was confirmed on all surfaces.  The  peritoneum was reapproximated using 2-0 vicryl running stitches. The fascia was then closed using 0 Vicryl in a running fashion. The subcutaneous layer was reapproximated with plain gut and the skin was closed with 4-0 vicryl. The patient tolerated the procedure well. Sponge, lap, instrument and needle counts were correct x 2. She was taken to the recovery room in stable condition.

## 2020-12-09 NOTE — Anesthesia Postprocedure Evaluation (Signed)
Anesthesia Post Note  Patient: Michele Maldonado  Procedure(s) Performed: CESAREAN SECTION MULTI-GESTATIONAL AND IUD INSERT (N/A )     Patient location during evaluation: PACU Anesthesia Type: Spinal Level of consciousness: awake and alert Pain management: pain level controlled Vital Signs Assessment: post-procedure vital signs reviewed and stable Respiratory status: spontaneous breathing, nonlabored ventilation and respiratory function stable Cardiovascular status: blood pressure returned to baseline and stable Postop Assessment: no apparent nausea or vomiting Anesthetic complications: no   No complications documented.  Last Vitals:  Vitals:   12/09/20 1300 12/09/20 1321  BP: 106/78 (!) 111/53  Pulse: 96 70  Resp: (!) 23   Temp:  36.5 C  SpO2: 99% 100%    Last Pain:  Vitals:   12/09/20 1323  TempSrc:   PainSc: 4    Pain Goal:                Epidural/Spinal Function Cutaneous sensation: Tingles (12/09/20 1323), Patient able to flex knees: Yes (12/09/20 1323), Patient able to lift hips off bed: Yes (12/09/20 1323), Back pain beyond tenderness at insertion site: No (12/09/20 1323), Progressively worsening motor and/or sensory loss: No (12/09/20 1323), Bowel and/or bladder incontinence post epidural: No (12/09/20 1323)  Lowella Curb

## 2020-12-09 NOTE — H&P (Signed)
Faculty Practice H&P  Michele Maldonado is a 35 y.o. female [id number] with IUP at [redacted]w[redacted]d presenting for repeat cesarean section for twins, malposition, prior cesarean delivery. Pregnancy was been complicated by paroxysmal A Fib, MVP, nephrotic syndrome.    Pt states she has been having no contractions, no vaginal bleeding, intact membranes, with normal fetal movement.     Prenatal Course Source of Care: [facility] with onset of care at 11 weeks  Pregnancy complications or risks: Patient Active Problem List   Diagnosis Date Noted  . Dichorionic diamniotic twin pregnancy in third trimester 12/09/2020  . [redacted] weeks gestation of pregnancy 10/01/2020  . Supervision of high risk pregnancy, antepartum 06/12/2020  . Twin pregnancy 06/12/2020  . A-fib (HCC) 05/14/2020  . Kell isoimmunization in pregnancy Feb 05, 2018  . Death of child February 05, 2018  . Mitral valve prolapse 01/10/2018  . History of nephrotic syndrome 10/26/2016  . History of 2 cesarean sections 12/30/2013  . Gestational thrombocytopenia (HCC) 12/27/2013   She desires IUD for contraception.  She plans to breastfeed, plans to bottle feed  Prenatal labs and studies: ABO, Rh: --/--/B POS (01/24 0934) Antibody: NEG (01/24 0934) Rubella: 3.77 (07/29 0925) RPR: NON REACTIVE (01/24 0934)  HBsAg: Negative (07/29 0925)  HIV: Non Reactive (11/17 0916)  GBS:    2hr Glucola: negative Genetic screening: normal Anatomy US: normal  Past Medical History:  Past Medical History:  Diagnosis Date  . Anemia   . Atrial fibrillation (HCC)   . Kell isoimmunization during pregnancy    a. per prior notes - h/o anti Kell alloimmunization.  . MVP (mitral valve prolapse)   . Thrombocytopenia (HCC)   . Ventricular septal defect     Past Surgical History:  Past Surgical History:  Procedure Laterality Date  . CESAREAN SECTION N/A 12/29/2013   Procedure: CESAREAN SECTION;  Surgeon: Allie Bossier, MD;  Location: [facility];  Service: Obstetrics;  Laterality:  N/A;  . REPAIR VAGINAL CUFF N/A 12/30/2013   Procedure: REPAIR VAGINAL CUFF;  Surgeon: Allie Bossier, MD;  Location: [facility];  Service: Gynecology;  Laterality: N/A;  Insertion of Bakri Balloon    Obstetrical History:  OB History    Gravida  7   Para  6   Term  4   Preterm  2   AB  0   Living  5     SAB  0   IAB  0   Ectopic  0   Multiple  0   Live Births  6        Obstetric Comments  Child born in 2015/03/26 deceased --- died at 38 months old, taken to ED unresponsive -- pt not ready to talk         Gynecological History:  OB History    Gravida  7   Para  6   Term  4   Preterm  2   AB  0   Living  5     SAB  0   IAB  0   Ectopic  0   Multiple  0   Live Births  6        Obstetric Comments  Child born in 26-Mar-2015 deceased --- died at 19 months old, taken to ED unresponsive -- pt not ready to talk         Social History:  Social History   Socioeconomic History  . Marital status: Single    Spouse name: Not on file  . Number of children:  Not on file  . Years of education: Not on file  . Highest education level: Not on file  Occupational History  . Not on file  Tobacco Use  . Smoking status: Never Smoker  . Smokeless tobacco: Never Used  Vaping Use  . Vaping Use: Never used  Substance and Sexual Activity  . Alcohol use: Not Currently  . Drug use: No    Types: Marijuana    Comment: former  . Sexual activity: Yes    Birth control/protection: None  Other Topics Concern  . Not on file  Social History Narrative  . Not on file   Social Determinants of Health   Financial Resource Strain: Not on file  Food Insecurity: No Food Insecurity  . Worried About Programme researcher, broadcasting/film/video in the Last Year: Never true  . Ran Out of Food in the Last Year: Never true  Transportation Needs: No Transportation Needs  . Lack of Transportation (Medical): No  . Lack of Transportation (Non-Medical): No  Physical Activity: Not on file  Stress: Not on file  Social  Connections: Not on file    Family History:  Family History  Problem Relation Age of Onset  . Hypertension Maternal Grandmother   . Diabetes Maternal Grandmother   . Thyroid disease Mother   . Atrial fibrillation Maternal Grandfather     Medications:  Prenatal vitamins,  Current Facility-Administered Medications  Medication Dose Route Frequency Provider Last Rate Last Admin  . ceFAZolin (ANCEF) IVPB 2g/100 mL premix  2 g Intravenous On Call to OR [name], MD      . levonorgestrel (LILETTA) 19.5 MCG/DAY IUD   Intrauterine Once Shahzad Thomann J, DO      . povidone-iodine 10 % swab 2 application  2 application Topical Once [name], MD      . sodium citrate-citric acid (ORACIT) solution 30 mL  30 mL Oral On Call to OR [name], MD        Allergies: No Known Allergies  Review of Systems: - negative  Physical Exam: Last menstrual period 03/24/2020, unknown if currently breastfeeding. GENERAL: Well-developed, well-nourished female in no acute distress.  LUNGS: Clear to auscultation bilaterally.  HEART: Regular rate and rhythm. ABDOMEN: Soft, nontender, nondistended, gravid. EXTREMITIES: Nontender, no edema, 2+ distal pulses. Presentation: breech/breech    Pertinent Labs/Studies:   Lab Results  Component Value Date   WBC 8.9 12/08/2020   HGB 11.4 (L) 12/08/2020   HCT 34.3 (L) 12/08/2020   MCV 92.5 12/08/2020   PLT 122 (L) 12/08/2020    Assessment : Michele Maldonado is a 35 y.o. [id number] at [redacted]w[redacted]d being admitted for cesarean section secondary to twin gestation, previous cesarean section, malposition.  Plan: The risks of cesarean section discussed with the patient included but were not limited to: bleeding which may require transfusion or reoperation; infection which may require antibiotics; injury to bowel, bladder, ureters or other surrounding organs; injury to the fetus; need for additional procedures including hysterectomy in the event of a  life-threatening hemorrhage; placental abnormalities wth subsequent pregnancies, incisional problems, thromboembolic phenomenon and other postoperative/anesthesia complications. The patient concurred with the proposed plan, giving informed written consent for the procedure.   Patient has been NPO since last night and will remain NPO for procedure.  Preoperative prophylactic Ancef ordered on call to the OR.   Plan for intraoperative IUD placement.  Levie Heritage, DO 12/09/2020, 9:52 AM

## 2020-12-09 NOTE — Anesthesia Preprocedure Evaluation (Signed)
Anesthesia Evaluation  Patient identified by MRN, date of birth, ID band Patient awake    Reviewed: Allergy & Precautions, H&P , NPO status , Patient's Chart, lab work & pertinent test results  Airway Mallampati: II  TM Distance: >3 FB Neck ROM: full    Dental no notable dental hx.    Pulmonary neg pulmonary ROS,    Pulmonary exam normal breath sounds clear to auscultation       Cardiovascular Exercise Tolerance: Good negative cardio ROS   Rhythm:regular Rate:Normal     Neuro/Psych negative neurological ROS  negative psych ROS   GI/Hepatic negative GI ROS, Neg liver ROS,   Endo/Other  negative endocrine ROS  Renal/GU negative Renal ROS  negative genitourinary   Musculoskeletal negative musculoskeletal ROS (+)   Abdominal   Peds negative pediatric ROS (+)  Hematology negative hematology ROS (+)   Anesthesia Other Findings plts 122 Hx of post partum hemorrhage  Reproductive/Obstetrics (+) Pregnancy                             Anesthesia Physical  Anesthesia Plan  ASA: II  Anesthesia Plan: Spinal   Post-op Pain Management:    Induction:   PONV Risk Score and Plan: 2 and Treatment may vary due to age or medical condition  Airway Management Planned: Natural Airway  Additional Equipment:   Intra-op Plan:   Post-operative Plan:   Informed Consent: I have reviewed the patients History and Physical, chart, labs and discussed the procedure including the risks, benefits and alternatives for the proposed anesthesia with the patient or authorized representative who has indicated his/her understanding and acceptance.     Dental Advisory Given  Plan Discussed with: CRNA  Anesthesia Plan Comments: (Lab work and procedure  confirmed with CRNA in room; platelets okay. Discussed spinal anesthetic, and patient consents to the procedure:  included risk of possible headache,backache,  failed block, allergic reaction, and nerve injury. This patient was asked if she had any questions or concerns before the procedure started.  )        Anesthesia Quick Evaluation

## 2020-12-09 NOTE — Transfer of Care (Signed)
Immediate Anesthesia Transfer of Care Note  Patient: Michele Maldonado  Procedure(s) Performed: CESAREAN SECTION MULTI-GESTATIONAL AND IUD INSERT (N/A )  Patient Location: PACU  Anesthesia Type:Spinal  Level of Consciousness: awake, alert  and oriented  Airway & Oxygen Therapy: Patient Spontanous Breathing  Post-op Assessment: Report given to RN and Post -op Vital signs reviewed and stable  Post vital signs: Reviewed and stable  Last Vitals:  Vitals Value Taken Time  BP    Temp    Pulse    Resp    SpO2      Last Pain:  Vitals:   12/09/20 0959  TempSrc: Oral  PainSc: 0-No pain         Complications: No complications documented.

## 2020-12-09 NOTE — Anesthesia Procedure Notes (Signed)
Spinal  Patient location during procedure: OR Start time: 12/09/2020 10:37 AM End time: 12/09/2020 10:37 AM Staffing Performed: anesthesiologist  Anesthesiologist: Lowella Curb, MD Spinal Block Patient position: sitting Prep: ChloraPrep Patient monitoring: heart rate, cardiac monitor, continuous pulse ox and blood pressure Approach: midline Location: L3-4 Injection technique: single-shot Needle Needle type: Pencan  Needle gauge: 24 G Needle length: 9 cm Assessment Sensory level: T4

## 2020-12-09 NOTE — Lactation Note (Signed)
This note was copied from a baby's chart. Lactation Consultation Note  Patient Name: Michele Maldonado [id number] Date: 12/09/2020 Reason for consult: Initial assessment;Early term 37-38.6wks;Multiple gestation Age:33 hours  Maternal Data Formula Feeding for Exclusion: Yes Reason for exclusion: Mother's choice to formula and breast feed on admission Has patient been taught Hand Expression?: Yes Does the patient have breastfeeding experience prior to this delivery?: Yes  Feeding Feeding Type: Breast Fed  LATCH Score Latch: Grasps breast easily, tongue down, lips flanged, rhythmical sucking.  Audible Swallowing: Spontaneous and intermittent  Type of Nipple: Everted at rest and after stimulation  Comfort (Breast/Nipple): Soft / non-tender  Hold (Positioning): No assistance needed to correctly position infant at breast.  LATCH Score: 10    Lactation Tools Discussed/Used Tools: Flanges Flange Size: 24 WIC Program: Yes Pump Education: Setup, frequency, and cleaning;Milk Storage Initiated by:: [name] Date initiated:: 12/09/20  LC and LC Student entered the room to see P7 Mom after a C-section for a set of twins that were 2 hours old. Twin [name] was STS at, breastfeeding upon arrival, twin [name] was in the bassinet sleeping.   [facility] Student reviewed initial consult questions; Mom expressed having previous experience with hand expression, breastfeeding, and infant care. She expressed primary concerns around babies being twins and how to feed multiples. Mom intends to breastfeed and bottlefeed in hopes to have support. Dad was present in the room and was also supportive.   [facility] Student reviewed resources and information around late preterm babies and how they may have to have supplementation based on how they remove milk from the breasts. Also discussed keeping babies STS as much as possible and feeding them based on cues.   LC set up the breast pump and recommended  pumping and using pumped milk to to supplement immediately come up. When checking baby A's latch, it was deep and could hear swallows. LC also discussed milk storage and cleaning pumping parts.  [facility] Student also discussed self-care and recovery from birth; hydration and eating food enough is important to postpartum as well as her breastfeeding journey.   [facility] Student also encouraged mom to call her nurse and request lactation as needed; also gave her resources related to calling lactation after she went home.   Consult Status Consult Status: Follow-up Date: 12/10/20 Follow-up type: In-patient    [name] -LC student 12/09/2020, 2:28 PM   [name] 12/09/2020, 2:57 PM

## 2020-12-10 ENCOUNTER — Encounter (HOSPITAL_COMMUNITY): Payer: Self-pay | Admitting: Family Medicine

## 2020-12-10 LAB — CBC
HCT: 32.7 % — ABNORMAL LOW (ref 36.0–46.0)
Hemoglobin: 10.9 g/dL — ABNORMAL LOW (ref 12.0–15.0)
MCH: 31.1 pg (ref 26.0–34.0)
MCHC: 33.3 g/dL (ref 30.0–36.0)
MCV: 93.2 fL (ref 80.0–100.0)
Platelets: 119 10*3/uL — ABNORMAL LOW (ref 150–400)
RBC: 3.51 MIL/uL — ABNORMAL LOW (ref 3.87–5.11)
RDW: 14.9 % (ref 11.5–15.5)
WBC: 13.9 10*3/uL — ABNORMAL HIGH (ref 4.0–10.5)
nRBC: 0 % (ref 0.0–0.2)

## 2020-12-10 LAB — CREATININE, SERUM
Creatinine, Ser: 0.69 mg/dL (ref 0.44–1.00)
GFR, Estimated: >60 mL/min (ref >60)

## 2020-12-10 MED ORDER — FERROUS SULFATE 325 (65 FE) MG PO TABS
325.0000 mg | ORAL_TABLET | ORAL | Status: DC
  Administered 2020-12-10 – 2020-12-11 (×2): 325 mg via ORAL
  Filled 2020-12-10 (×2): qty 1

## 2020-12-10 NOTE — Lactation Note (Signed)
This note was copied from a baby's chart. Lactation Consultation Note  Patient Name: Michele Maldonado Date: 12/10/2020 Reason for consult: Follow-up assessment;Multiple gestation;Early term 37-38.6wks Age:33 hours  Mom reports they are breastfeeding well.   Mom giving formula bottles after each feeding.  Urged mom to try and pump each time formula bottles given.  Faxed WIC breastfeeding referral.  Praised efforts.  Urged mom to always offer the breast first and let dad feed a bottle while mom pumps.  Urged to call lactation as needed.  Feeding Feeding Type: Bottle Fed - Formula  LATCH Score                   Interventions Interventions: Breast feeding basics reviewed;DEBP  Lactation Tools Discussed/Used WIC Program: Yes (WIC referrral sent/success) Pump Education: Setup, frequency, and cleaning   Consult Status Consult Status: Follow-up Date: 12/11/20 Follow-up type: In-patient    Michele Maldonado 12/10/2020, 8:40 PM

## 2020-12-10 NOTE — Progress Notes (Signed)
POSTPARTUM PROGRESS NOTE  Subjective: Michele Maldonado is a 35 y.o. [id number] s/p rLTCS at [redacted]w[redacted]d. She reports she doing well. No acute events overnight. She denies any problems with ambulating or po intake. Denies nausea or vomiting. She has not yet passed flatus. Pain is well controlled.  Lochia is minimal. Foley now out.  Objective: Blood pressure (!) 113/55, pulse 62, temperature 98.2 F (36.8 C), temperature source Oral, resp. rate 18, height 5\' 8"  (1.727 m), weight 98.4 kg, last menstrual period 03/24/2020, SpO2 100 %, unknown if currently breastfeeding.  Physical Exam:  General: alert, cooperative and no distress Chest: no respiratory distress Abdomen: soft, non-tender  Uterine Fundus: firm and at level of umbilicus Extremities: No calf swelling or tenderness  no LE edema  Recent Labs    12/08/20 0934  HGB 11.4*  HCT 34.3*    Assessment/Plan: Michele Maldonado is a 36 y.o. 34 s/p rLTCS at [redacted]w[redacted]d for malpresentation of didi twins.  Routine Postpartum Care: Doing well, pain well-controlled.  -- Continue routine care, lactation support  -- Contraception: s/p post-placental IUD -- Feeding: breast & bottle -- Paroxysmal Afib: HR wnl overnight. No reported symptoms. -- Nephrotic Syndrome: Cr 0.69 > 0.68 with good urine output. Foley removed this AM--will continue to monitor UOP. -- Anemia: Hgb 11.4 on admission. Will f/u POD#1 Hgb and supplement with po vs IV iron as clinically indicated.  Dispo: Plan for discharge POD#2-3.  [redacted]w[redacted]d, MD OB Fellow, Faculty Practice 12/10/2020 6:54 AM

## 2020-12-10 NOTE — Progress Notes (Addendum)
Patient ID: Kalman Shan, female   DOB: [birth year], 35 y.o.   MRN: [id number] POSTPARTUM PROGRESS NOTE  Subjective: Kateena Gharibian is a 35 y.o. [id number] s/p rLTCS at [redacted]w[redacted]d.  She reports she is doing well. No acute events overnight. She denies any problems with ambulating, voiding or po intake. Denies nausea or vomiting. She has not passed flatus. Pain is well controlled.  Lochia is minimal. Denies N/V. Comfortable staying overnight. Is wanting a circumcision for baby boy. Foley was removed this morning.  Objective: Blood pressure (!) 113/55, pulse 62, temperature 98.2 F (36.8 C), temperature source Oral, resp. rate 18, height 5\' 8"  (1.727 m), weight 98.4 kg, last menstrual period 03/24/2020, SpO2 100 %, unknown if currently breastfeeding.  Physical Exam:  General: alert, cooperative and no distress Chest: no respiratory distress Abdomen: soft, non-tender  Uterine Fundus: firm and at level of umbilicus Extremities: No calf swelling or tenderness  No bilateral lower extremity edema  Recent Labs    12/08/20 0934 12/10/20 0527  HGB 11.4* 10.9*  HCT 34.3* 32.7*    Assessment/Plan: Lurie Pe is a 35 y.o. 34 s/p rLTCS at [redacted]w[redacted]d for malpresentation of di-di twins.  Routine Postpartum Care: Doing well, pain well-controlled.  -- Continue routine care, lactation support  -- Contraception: s/p post-placental IUD -- Feeding: Breast and Bottle -- Paroxysmal Afib: HR wnl overnight/this morning. -- Nephrotic Syndrome: Cr .69 this AM up from .68 two days ago. UO appropriate. Foley removed this morning. Will continue to follow UO. -- Anemia: Hgb 10.9 this AM down from 11.4 two days ago. Started on PO iron supplementation QOD today.  Dispo: Plan for discharge POD#2-3.  [redacted]w[redacted]d, Medical Student OB Fellow, Faculty Practice 12/10/2020 8:35 AM  Attestation of Supervision of Student:  I confirm that I have verified the information documented in the medical student's note and  that I have also personally reperformed the history, physical exam and all medical decision making activities.  I have verified that all services and findings are accurately documented in this student's note; and I agree with management and plan as outlined in the documentation. I have also made any necessary editorial changes.  12/12/2020, MD [facility] 12/10/2020 8:52 AM

## 2020-12-11 ENCOUNTER — Ambulatory Visit: Payer: Medicaid Other

## 2020-12-11 LAB — SURGICAL PATHOLOGY

## 2020-12-11 MED ORDER — ACETAMINOPHEN 500 MG PO TABS: 1000.0000 mg | ORAL_TABLET | Freq: Four times a day (QID) | ORAL | 0 refills | Status: DC

## 2020-12-11 MED ORDER — POLYETHYLENE GLYCOL 3350 17 G PO PACK: 17.0000 g | PACK | Freq: Every day | ORAL | 0 refills | Status: DC

## 2020-12-11 MED ORDER — FERROUS SULFATE 325 (65 FE) MG PO TABS: 325.0000 mg | ORAL_TABLET | ORAL | 3 refills | Status: DC

## 2020-12-11 MED ORDER — COCONUT OIL OIL: 1.0000 "application " | TOPICAL_OIL | 0 refills | Status: DC | PRN

## 2020-12-11 MED ORDER — IBUPROFEN 800 MG PO TABS: 800.0000 mg | ORAL_TABLET | Freq: Four times a day (QID) | ORAL | 0 refills | Status: DC

## 2020-12-11 MED ORDER — OXYCODONE HCL 5 MG PO TABS: 5.0000 mg | ORAL_TABLET | ORAL | 0 refills | Status: DC | PRN

## 2020-12-11 NOTE — Lactation Note (Signed)
This note was copied from a baby's chart. Lactation Consultation Note  Patient Name: Boy Melah Balthazar [id number] Date: 12/11/2020 Reason for consult: Follow-up assessment;Early term 37-38.6wks;Other (Comment) (no weight loss) Age:33 hours  Baby latched easily after mom changed the diaper with depth and increased swallows. Per mom comfortable.  Mom flowing the feeding plans well well for the twins and knows to supplement after the breast feeding. Baby fed 15 mins per mom.  Dad supplementing.    Maternal Data    Feeding Feeding Type: Breast Fed  LATCH Score Latch: Grasps breast easily, tongue down, lips flanged, rhythmical sucking.  Audible Swallowing: Spontaneous and intermittent  Type of Nipple: Everted at rest and after stimulation  Comfort (Breast/Nipple): Filling, red/small blisters or bruises, mild/mod discomfort  Hold (Positioning): No assistance needed to correctly position infant at breast.  LATCH Score: 9  Interventions Interventions: Breast feeding basics reviewed;Breast compression;Support pillows;DEBP  Lactation Tools Discussed/Used WIC Program: Yes (per mom plans to pick up DEBP)   Consult Status Consult Status: Complete Date: 12/11/20    Kathrin Greathouse 12/11/2020, 9:12 AM

## 2020-12-11 NOTE — Lactation Note (Signed)
This note was copied from a baby's chart. Lactation Consultation Note  Patient Name: Michele Maldonado [id number] Date: 12/11/2020 Reason for consult: Follow-up assessment;Early term 37-38.6wks;Infant weight loss;Other (Comment) (5 % weight loss) Age:33 hours  2nd LC visit for Baby B .  Baby awake and mom latched with minimal assist with increased swallows.  Se below for details.  Sore nipple and engorgement prevention and tx .  Per mom plans to pick up a DEBP at [facility] tomorrow.  LC provided the [facility] brochure.   Maternal Data    Feeding Feeding Type: Breast Fed  LATCH Score Latch: Grasps breast easily, tongue down, lips flanged, rhythmical sucking.  Audible Swallowing: Spontaneous and intermittent  Type of Nipple: Everted at rest and after stimulation  Comfort (Breast/Nipple): Filling, red/small blisters or bruises, mild/mod discomfort  Hold (Positioning): Assistance needed to correctly position infant at breast and maintain latch.  LATCH Score: 8  Interventions Interventions: Breast feeding basics reviewed;Assisted with latch;Skin to skin;Adjust position;Position options  Lactation Tools Discussed/Used WIC Program: Yes Pump Education: Milk Storage   Consult Status Consult Status: Complete Date: 12/11/20    Kathrin Greathouse 12/11/2020, 10:48 AM

## 2020-12-11 NOTE — Discharge Summary (Signed)
Postpartum Discharge Summary    Patient Name: Michele Maldonado DOB: 1985/11/30 MRN: [id number]  Date of admission: 12/09/2020 Delivery date:   Lecretia, Buczek Dezaree [[id number]]  12/09/2020    Shari, Natt Ambur [[id number]]  12/09/2020   Delivering provider:    Carolan Shiver Keerstin [[id number]]  Ezme, Duch Nandika [[id number]]  Truett Mainland   Date of discharge: 12/11/2020  Admitting diagnosis: Dichorionic diamniotic twin pregnancy in third trimester [O30.043] Intrauterine pregnancy: [redacted]w[redacted]d    Secondary diagnosis:  Active Problems:   Gestational thrombocytopenia ([place]   History of 2 cesarean sections   Mitral valve prolapse   A-fib ([place]   Dichorionic diamniotic twin pregnancy in third trimester   Cesarean delivery delivered     Discharge diagnosis: Term Pregnancy Delivered                                              Post partum procedures:IUD placed Augmentation: N/A Complications: None  Hospital course: Sceduled C/S   35y.o. yo [id number] 364w1das admitted to the hospital 12/09/2020 for scheduled cesarean section with the following indication:Elective Repeat.Delivery details are as follows:  Membrane Rupture Time/Date:    RoEmily, Massaravett [[id number]]11:12 AM    RoLiana Crockeravett [[id number]]11:15 AM  ,   RoKemper, Hochmanavett [[id number]]12/09/2020    RoMulan, Adanavett [[id number]]12/09/2020    Delivery Method:   RoCarolan Shiveravett [[id number]]C-Section, Low Transverse    RoTishina, Lownavett [0[[id number]]C-Section, Low Transverse   Details of operation can be found in separate operative note.  Patient had an uncomplicated postpartum course.  She is ambulating, tolerating a regular diet, passing flatus, and urinating well. Patient is discharged home in stable condition on  12/11/20        Newborn Data: Birth date:   RoRemmy, Crassavett [0[[id number]]12/09/2020    RoNautica, Hotzavett  [0[[id number]]12/09/2020   Birth time:   RoNikkia, Devossavett [0[[id number]]11:13 AM    RoJohnita, Palleschiavett [[id number]]11:17 AM   Gender:   RoShraddha, Lebronavett [[id number]]Female    RoAiyanah, Kalamaavett [[id number]]Female   Living status:   RoLadasha, Schnackenbergavett [[id number]]Living    RoMarquitta, Persichettiavett [[id number]]Living   Apgars:   RoRhylee, Pucilloavett [0[[id number]]9 [place][[id number]]9  , Genieve, Ramaswamyavett [0[[id number]]9    RoGlayds, Inscoavett [0[[id number]]9   Weight:   RoOrtencia, Askariavett [0[[id number]]3470 g    RoAzaria, Stegmanavett [0[[id number]]2885 g      Magnesium Sulfate received: No BMZ received: No Rhophylac:N/A MMR:N/A T-DaP:Given prenatally Flu: Yes Transfusion:No  Physical exam  Vitals:   12/10/20 0531 12/10/20 1318 12/10/20 2034 12/11/20 0533  BP: (!) 113/55 117/68 128/65 118/73  Pulse: 62 64 74 78  Resp: _0 Temp: 98.2 F (36.8 C) 98.1 F (36.7 C) 98.3 F (36.8 C) 98 F (36.7 C)  TempSrc: Oral Oral Oral Oral  SpO2: 100% 100% 99% 100%  Weight:      Height:       General: alert Lochia: appropriate Uterine Fundus: firm Incision: Healing well with no significant  drainage DVT Evaluation: No evidence of DVT seen on physical exam. Labs: Lab Results  Component Value Date   WBC 13.9 (H) 12/10/2020   HGB 10.9 (L) 12/10/2020   HCT 32.7 (L) 12/10/2020   MCV 93.2 12/10/2020   PLT 119 (L) 12/10/2020   CMP Latest Ref Rng & Units 12/10/2020  Glucose 70 - 99 mg/dL -  BUN 6 - 20 mg/dL -  Creatinine 0.44 - 1.00 mg/dL 0.69  Sodium 135 - 145 mmol/L -  Potassium 3.5 - 5.1 mmol/L -  Chloride 98 - 111 mmol/L -  CO2 22 - 32 mmol/L -  Calcium 8.9 - 10.3 mg/dL -  Total Protein 6.5 - 8.1 g/dL -  Total Bilirubin 0.3 - 1.2 mg/dL -  Alkaline Phos 38 - 126 U/L -  AST 15 - 41 U/L -  ALT 0 - 44 U/L -   Edinburgh Score: Edinburgh Postnatal Depression Scale Screening Tool 12/09/2020  I  have been able to laugh and see the funny side of things. 0  I have looked forward with enjoyment to things. 0  I have blamed myself unnecessarily when things went wrong. 0  I have been anxious or worried for no good reason. 3  I have felt scared or panicky for no good reason. 0  Things have been getting on top of me. 0  I have been so unhappy that I have had difficulty sleeping. 0  I have felt sad or miserable. 0  I have been so unhappy that I have been crying. 0  The thought of harming myself has occurred to me. 0  Edinburgh Postnatal Depression Scale Total 3     After visit meds:  Allergies as of 12/11/2020   No Known Allergies     Medication List    STOP taking these medications   Blood Pressure Kit [place] Supp Lg Misc     TAKE these medications   acetaminophen 500 MG tablet Commonly known as: TYLENOL Take 2 tablets (1,000 mg total) by mouth every 6 (six) hours.   coconut oil Oil Apply 1 application topically as needed.   ferrous sulfate 325 (65 FE) MG tablet Take 1 tablet (325 mg total) by mouth every other day. Start taking on: December 12, 2020   ibuprofen 800 MG tablet Commonly known as: ADVIL Take 1 tablet (800 mg total) by mouth every 6 (six) hours.   oxyCODONE 5 MG immediate release tablet Commonly known as: Oxy IR/ROXICODONE Take 1 tablet (5 mg total) by mouth every 4 (four) hours as needed for moderate pain.   polyethylene glycol 17 g packet Commonly known as: MiraLax Mix-In Pax Take 17 g by mouth daily.   prenatal vitamin w/FE, FA 27-1 MG Tabs tablet Take 1 tablet by mouth daily at 12 noon.            Discharge Care Instructions  (From admission, onward)         Start     Ordered   12/11/20 0000  Discharge wound care:       Comments: Remove Honeycomb on Post-Op day 5-7   12/11/20 0826           Discharge home in stable condition Infant Feeding: Bottle and Breast Infant Disposition:home with mother Discharge  instruction: per After Visit Summary and Postpartum booklet. Activity: Advance as tolerated. Pelvic rest for 6 weeks.  Diet: routine diet Future Appointments:No future appointments. Follow up Visit:   Please schedule this patient  for a In person postpartum visit in 6 weeks with the following provider: MD. Additional Postpartum F/U:Incision check 1 week  High risk pregnancy complicated by: hx of afib, hx of cs x2, hx of nephrotic syndrome Delivery mode:     Bralee, Feldt Lyndsy [[id number]]  C-Section, Low Transverse    Niaya, Hickok Yaritzi [[id number]]  C-Section, Low Transverse   Anticipated Birth Control:  PP IUD placed   12/11/2020 Janet Berlin, MD

## 2020-12-12 ENCOUNTER — Encounter (HOSPITAL_COMMUNITY)
Admission: RE | Admit: 2020-12-12 | Discharge: 2020-12-12 | Disposition: A | Discharge status: Home / Self Care | Payer: Medicaid Other | Source: Ambulatory Visit | Attending: Family Medicine | Admitting: Family Medicine

## 2020-12-12 ENCOUNTER — Other Ambulatory Visit (HOSPITAL_COMMUNITY): Payer: Medicaid Other

## 2020-12-12 LAB — TYPE AND SCREEN
ABO/RH(D): B POS
Antibody Screen: NEGATIVE
Donor AG Type: NEGATIVE
Donor AG Type: NEGATIVE
Unit division: 0
Unit division: 0

## 2020-12-12 LAB — BPAM RBC
Blood Product Expiration Date: 202202182359
Blood Product Expiration Date: 202202212359
Unit Type and Rh: 7300
Unit Type and Rh: 7300

## 2020-12-16 DIAGNOSIS — Z419 Encounter for procedure for purposes other than remedying health state, unspecified: Secondary | ICD-10-CM | POA: Diagnosis not present

## 2020-12-20 IMAGING — US US MFM OB DETAIL+14 WK
1 series · 15 of 28 positions shown · non-contrast
Comparison: none

[Series 1: us mfm ob detail+14 wk · 118 acquisitions, 15 frames shown]
[im 1/118]
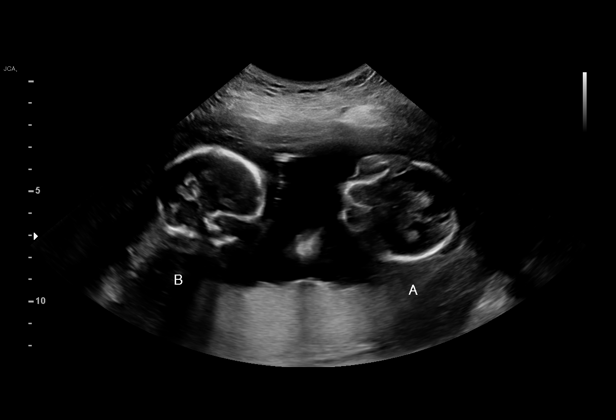
[im 9/118]
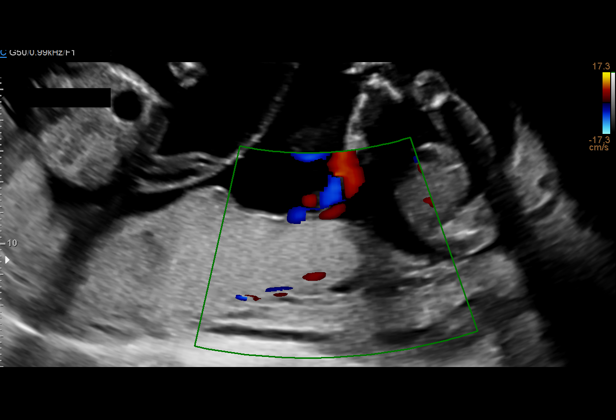
[im 18/118]
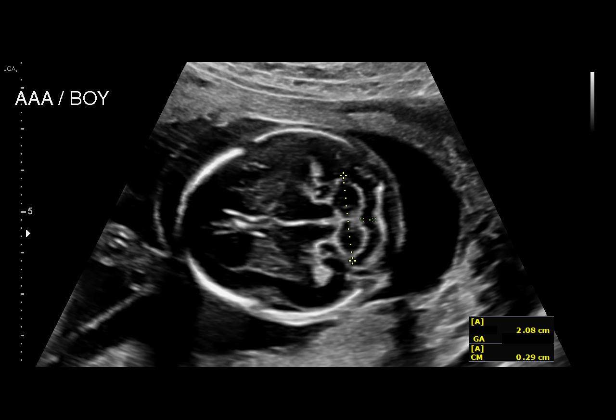
[im 27/118]
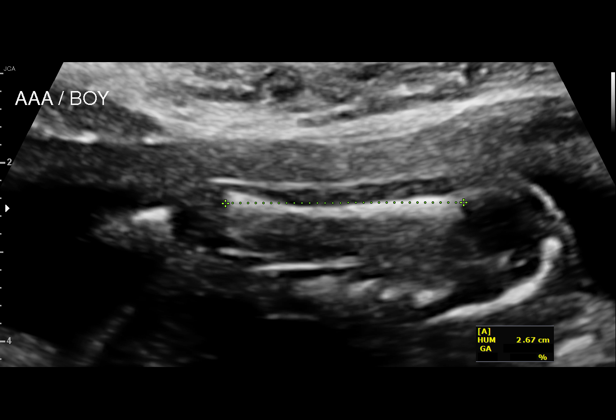
[im 35/118]
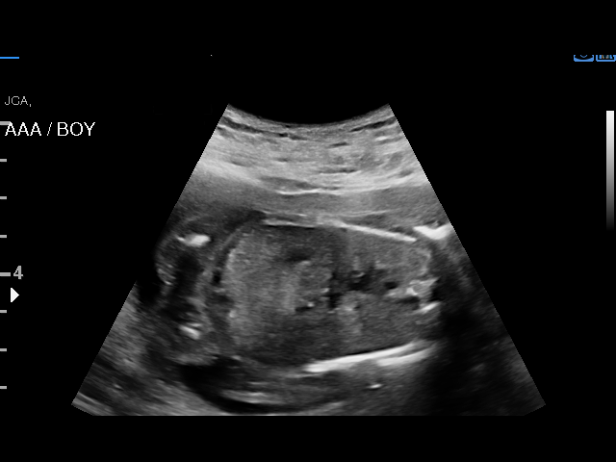
[im 44/118]
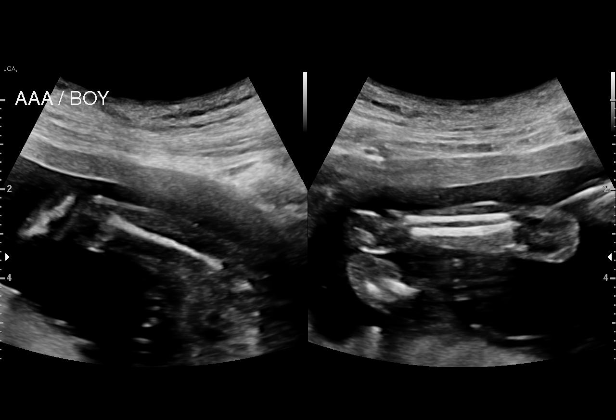
[im 53/118]
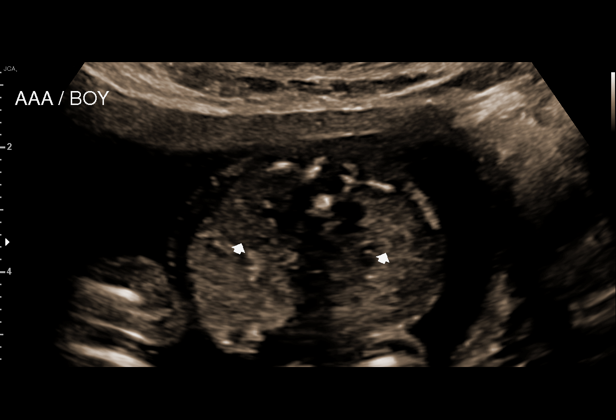
[im 61/118]
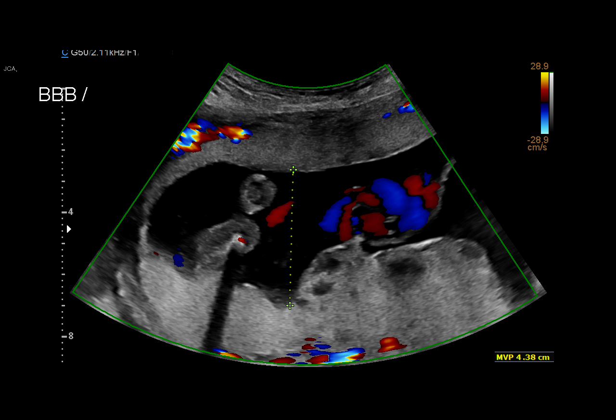
[im 66/118]
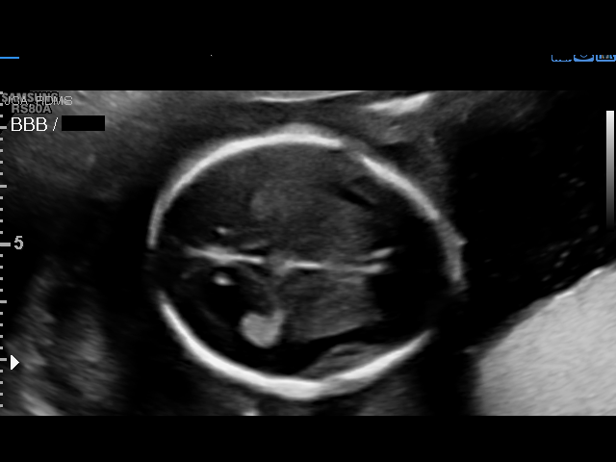
[im 74/118]
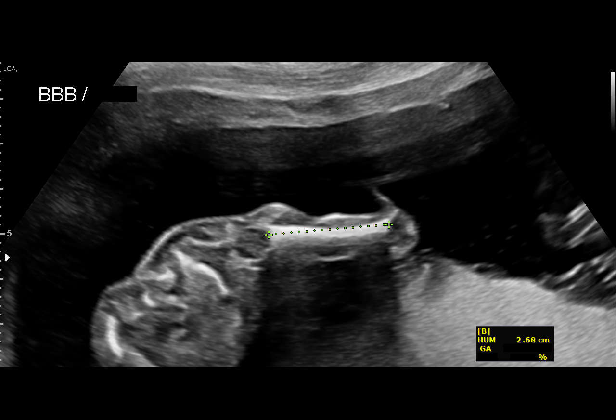
[im 83/118]
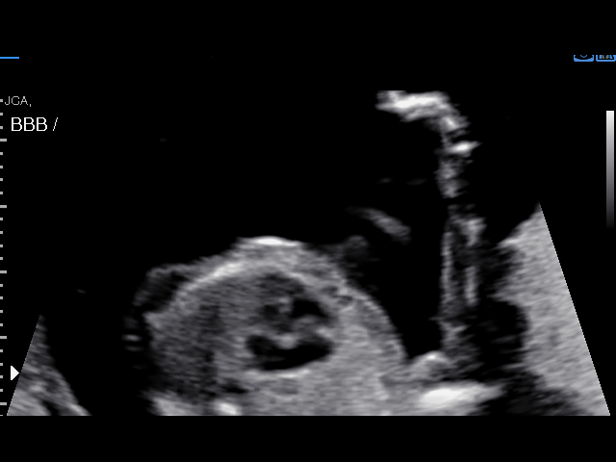
[im 92/118]
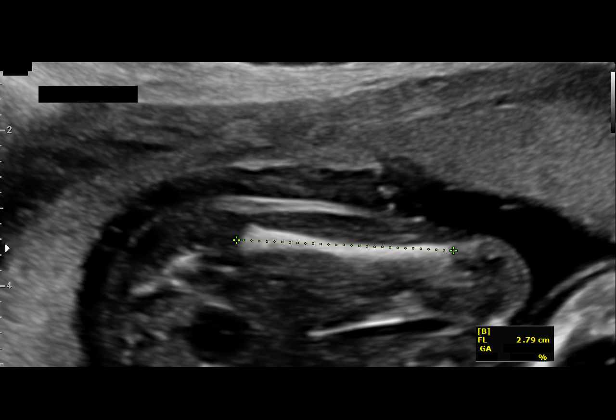
[im 100/118]
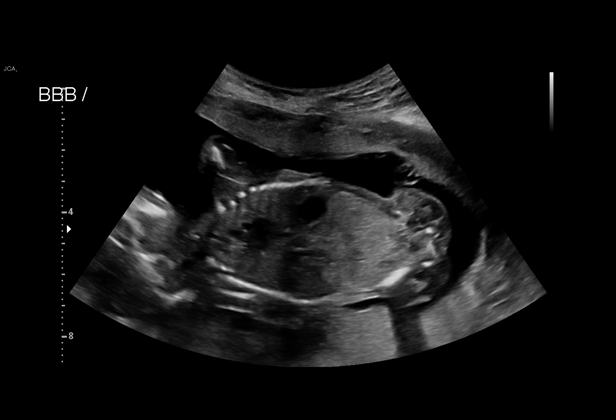
[im 109/118]
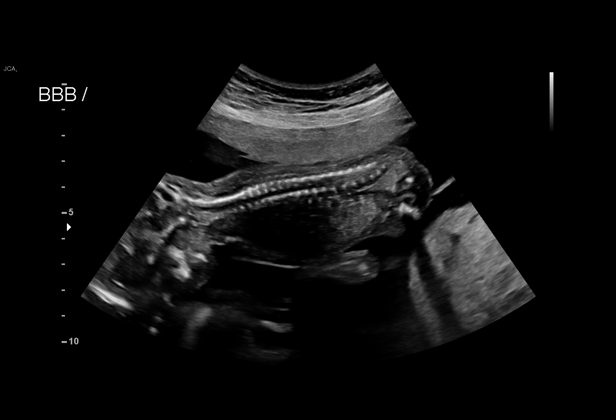
[im 118/118]
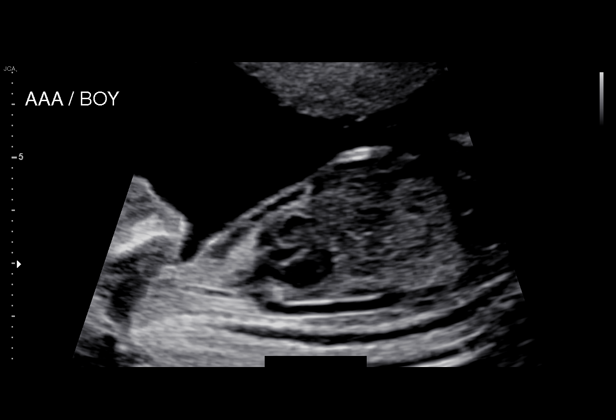

[15 of 28 positions shown; findings below may reference images not displayed]

Attending:        Kaiyala Scheun      Secondary Phy.:   [REDACTED]
                                                            Healthcare
                   [id number]

    +14 WK

Indications

 Twin pregnancy, di/di, second trimester
 Encounter for antenatal screening for
 malformations
 18 weeks gestation of pregnancy
 [employer] isoimmunization during pregnancy in
 second trimester
 History of cesarean delivery, currently
 pregnant
 Poor obstetrical history (hx of
 thrombocytopenia)
 Low Risk NIPS, Neg Horizon
Fetal Evaluation (Fetus A)

 Num Of Fetuses:         2
 Fetal Heart Rate(bpm):  144
 Cardiac Activity:       Observed
 Fetal Lie:              Lower Fetus
 Presentation:           Transverse, head to maternal right
 Placenta:               Posterior
 P. Cord Insertion:      Visualized
 Membrane Desc:      Dividing Membrane seen - Dichorionic.
 Amniotic Fluid
 AFI FV:      Within normal limits
Biometry (Fetus A)

 BPD:      44.2  mm     G. Age:  19w 3d         92  %    CI:        79.21   %    70 - 86
                                                         FL/HC:      17.3   %    15.8 - 18
 HC:       157   mm     G. Age:  18w 4d         66  %    HC/AC:      1.02        1.07 -
 AC:      153.3  mm     G. Age:  20w 4d         98  %    FL/BPD:     61.5   %
 FL:       27.2  mm     G. Age:  18w 2d         50  %    FL/AC:      17.7   %    20 - 24
 HUM:      26.5  mm     G. Age:  18w 2d         62  %
 CER:      20.8  mm     G. Age:  19w 6d     > 97.7  %
 NFT:       4.5  mm

 LV:          7  mm
 CM:        2.9  mm

 Est. FW:     291  gm    0 lb 10 oz      98  %     FW Discordancy     0 \ 10 %
OB History

 Gravidity:    7         Term:   4        Prem:   2        SAB:   0
 TOP:          0       Ectopic:  0        Living: 6
Gestational Age (Fetus A)

 LMP:           18w 1d        Date:  03/24/20                 EDD:   12/29/20
 U/S Today:     19w 2d                                        EDD:   12/21/20
 Best:          18w 1d     Det. By:  LMP  (03/24/20)          EDD:   12/29/20
Anatomy (Fetus A)

 Cranium:               Appears normal         Aortic Arch:            Appears normal
 Cavum:                 Appears normal         Ductal Arch:            Appears normal
 Ventricles:            Appears normal         Diaphragm:              Appears normal
 Choroid Plexus:        Appears normal         Stomach:                Appears normal, left
                                                                       sided
 Cerebellum:            Appears normal         Abdomen:                Appears normal
 Posterior Fossa:       Appears normal         Abdominal Wall:         Appears nml (cord
                                                                       insert, abd wall)
 Nuchal Fold:           Appears normal         Cord Vessels:           Appears normal (3
                                                                       vessel cord)
 Face:                  Appears normal         Kidneys:                Appear normal
                        (orbits and profile)
 Lips:                  Appears normal         Bladder:                Appears normal
 Thoracic:              Appears normal         Spine:                  Appears normal
 Heart:                 Not well visualized    Upper Extremities:      Appears normal
 RVOT:                  Not well visualized    Lower Extremities:      Appears normal
 LVOT:                  Not well visualized

 Other:  Normal genaitalia. Fetus appears to be a male. Nasal bone
         visualized. Technically difficult due to fetal position.

Fetal Evaluation (Fetus B)

 Num Of Fetuses:         2
 Fetal Heart Rate(bpm):  146
 Cardiac Activity:       Observed
 Fetal Lie:              Upper Fetus
 Presentation:           Cephalic
 Placenta:               Anterior
 P. Cord Insertion:      Visualized
 Membrane Desc:      Dividing Membrane seen - Dichorionic.

 Amniotic Fluid
 AFI FV:      Within normal limits
Biometry (Fetus B)

 BPD:      43.3  mm     G. Age:  19w 1d         87  %    CI:        76.71   %    70 - 86
                                                         FL/HC:      17.2   %    15.8 - 18
 HC:      156.6  mm     G. Age:  18w 4d         64  %    HC/AC:      1.10        1.07 -
 AC:      141.8  mm     G. Age:  19w 4d         88  %    FL/BPD:     62.1   %
 FL:       26.9  mm     G. Age:  18w 1d         46  %    FL/AC:      19.0   %    20 - 24
 HUM:      26.9  mm     G. Age:  18w 4d         67  %
 CER:        20  mm     G. Age:  19w 2d         94  %
 NFT:       3.1  mm

 LV:        7.6  mm
 CM:        4.5  mm

 Est. FW:     263  gm      0 lb 9 oz     87  %     FW Discordancy        10  %
Gestational Age (Fetus B)

 LMP:           18w 1d        Date:  03/24/20                 EDD:   12/29/20
 U/S Today:     18w 6d                                        EDD:   12/24/20
 Best:          18w 1d     Det. By:  LMP  (03/24/20)          EDD:   12/29/20
Anatomy (Fetus B)

 Cranium:               Appears normal         Aortic Arch:            Appears normal
 Cavum:                 Appears normal         Ductal Arch:            Not well visualized
 Ventricles:            Appears normal         Diaphragm:              Appears normal
 Choroid Plexus:        Appears normal         Stomach:                Appears normal, left
                                                                       sided
 Cerebellum:            Appears normal         Abdomen:                Appears normal
 Posterior Fossa:       Appears normal         Abdominal Wall:         Appears nml (cord
                                                                       insert, abd wall)
 Nuchal Fold:           Appears normal         Cord Vessels:           Appears normal (3
                                                                       vessel cord)
 Face:                  Appears normal         Kidneys:                Appear normal
                        (orbits and profile)
 Lips:                  Not well visualized    Bladder:                Appears normal
 Thoracic:              Appears normal         Spine:                  Limited views
                                                                       appear normal
 Heart:                 Not well visualized    Upper Extremities:      Appears normal
 RVOT:                  Not well visualized    Lower Extremities:      Appears normal
 LVOT:                  Appears normal

 Other:  Normal genaitalia. Fetus appears to be female. Technically difficult
         due to fetal position. Nasal bone visualized.
Impression

 Twin intrauterine pregnancy with features suggestive of
 Diamniotic Dichorionic pregnancy.
 Normal anatomy with good amniotic fluid and fetal movement
 was observed in Twin A and B.
 Suboptimal views of the fetal anatomy due to multiple
 gestation and fetal position.

 I reviewed the normal nature of today's ultrasound. Ms.
 Driscoll conveyed that she is taking low dose aspirin for
 preeclampsia prevention.

 We reviewed the sonographic findings and limitations of
 ultrasound. The potential risks associated with a twin
 gestation were discussed.  This discussion included a review
 of the increased risk of miscarriages, anomalies, preterm
 labor, and/or delivery, malpresentation, delivery via cesarean
 section, gestational diabetes, and/or preeclampsia.  With
 regards to fetal risks, there is an increased risk for fetal
 growth restriction of one or both twins, preterm labor, and
 associated morbidity, and intrauterine fetal demise.

 We recommend growth scans every 4 weeks starting at 24
 weeks with the initation of weekly antenatal testing in the
 form of twice weekly NST or weekly BPP should abnormal
 fetal growth or intertwin discordance of greater than 20-25%
 is noted.

 Following counseling, all questions were addressed.

 Ms. Mofina had to leave prior to reading of today
 examination.
Recommendations

 Follow up growth in 4 weeks.
 Initiate daily ASA.

## 2020-12-24 ENCOUNTER — Encounter: Payer: Self-pay | Admitting: Family Medicine

## 2020-12-24 ENCOUNTER — Ambulatory Visit (INDEPENDENT_AMBULATORY_CARE_PROVIDER_SITE_OTHER): Payer: Medicaid Other | Admitting: Family Medicine

## 2020-12-24 ENCOUNTER — Ambulatory Visit: Payer: Medicaid Other

## 2020-12-24 ENCOUNTER — Other Ambulatory Visit: Payer: Self-pay

## 2020-12-24 DIAGNOSIS — Z975 Presence of (intrauterine) contraceptive device: Secondary | ICD-10-CM

## 2020-12-24 DIAGNOSIS — O0990 Supervision of high risk pregnancy, unspecified, unspecified trimester: Secondary | ICD-10-CM

## 2020-12-24 NOTE — Progress Notes (Signed)
Post Partum Visit Note  Michele Maldonado is a 35 y.o. [phone] female who presents for a postpartum visit. She is 3 weeks postpartum following a repeat cesarean section.  I have fully reviewed the prenatal and intrapartum course. The delivery was at 37 gestational weeks.  Anesthesia: spinal. Postpartum course has been unremarkable. Babies are doing well. Baby is feeding by both breast and bottle - Similac Neosure. Bleeding moderate lochia. Bowel function is normal. Bladder function is normal. Patient is not sexually active. Contraception method is IUD. Postpartum depression screening: negative.   The pregnancy intention screening data noted above was reviewed.     Edinburgh Postnatal Depression Scale - 12/24/20 1515      Edinburgh Postnatal Depression Scale:  In the Past 7 Days   I have been able to laugh and see the funny side of things. 0    I have looked forward with enjoyment to things. 0    I have blamed myself unnecessarily when things went wrong. 0    I have been anxious or worried for no good reason. 0    I have felt scared or panicky for no good reason. 0    Things have been getting on top of me. 0    I have been so unhappy that I have had difficulty sleeping. 0    I have felt sad or miserable. 0    I have been so unhappy that I have been crying. 0    The thought of harming myself has occurred to me. 0    Edinburgh Postnatal Depression Scale Total 0            The following portions of the patient's history were reviewed and updated as appropriate: allergies, current medications, past family history, past medical history, past social history, past surgical history and problem list.  Review of Systems Pertinent items noted in HPI and remainder of comprehensive ROS otherwise negative.    Objective:  BP 108/72   Pulse 87   Ht 5\' 8"  (1.727 m)   Wt 199 lb 14.4 oz (90.7 kg)   LMP  (LMP Unknown)   Breastfeeding Yes   BMI 30.39 kg/m    General:  alert, cooperative and  appears stated age   Breasts:  not inspected  Lungs: comfortable on room air  Heart:  not examined  Abdomen: soft, nontender, incision well healed   Vulva:  normal  Vagina: normal vagina with moderate amount of blood, IUD strings long and trimmed to 3 cm  Cervix:  multiparous appearance and no lesions  Corpus: not examined  Adnexa:  not evaluated  Rectal Exam: Not performed.        Assessment:    Normal postpartum exam. Pap smear not done at today's visit.   Plan:   Essential components of care per ACOG recommendations:  1.  Mood and well being: Patient with negative depression screening today. Reviewed local resources for support.  - Patient does not use tobacco.  - hx of drug use? No    2. Infant care and feeding:  -Patient currently breastmilk feeding? No  -Social determinants of health (SDOH) reviewed in EPIC. No concerns  3. Sexuality, contraception and birth spacing - Patient does not want a pregnancy in the next year.  Desired family size is 6 children.  - Reviewed forms of contraception in tiered fashion. Patient desired IUD, strings trimmed today - Discussed birth spacing of 18 months  4. Sleep and fatigue -Encouraged family/partner/community support of  4 hrs of uninterrupted sleep to help with mood and fatigue  5. Physical Recovery  - Discussed patients delivery and complications - Patient had a repeat cesarean. Patient expressed understanding - Patient has urinary incontinence? No - Patient is safe to resume physical and sexual activity  6.  Health Maintenance - Last pap smear done 06/12/2020 and was normal with negative HPV.   [name], MD [facility], [facility]

## 2020-12-24 NOTE — Patient Instructions (Signed)
Cesarean Delivery, Care After This sheet gives you information about how to care for yourself after your procedure. Your health care provider may also give you more specific instructions. If you have problems or questions, contact your health care provider. What can I expect after the procedure? After the procedure, it is common to have:  A small amount of blood or clear fluid coming from the incision.  Some redness, swelling, and pain in your incision area.  Some abdominal pain and soreness.  Vaginal bleeding (lochia). Even though you did not have a vaginal delivery, you will still have vaginal bleeding and discharge.  Pelvic cramps.  Fatigue. You may have pain, swelling, and discomfort in the tissue between your vagina and your anus (perineum) if:  Your C-section was unplanned, and you were allowed to labor and push.  An incision was made in the area (episiotomy) or the tissue tore during attempted vaginal delivery. Follow these instructions at home: Incision care  Follow instructions from your health care provider about how to take care of your incision. Make sure you: ? Wash your hands with soap and water before you change your bandage (dressing). If soap and water are not available, use hand sanitizer. ? If you have a dressing, change it or remove it as told by your health care provider. ? Leave stitches (sutures), skin staples, skin glue, or adhesive strips in place. These skin closures may need to stay in place for 2 weeks or longer. If adhesive strip edges start to loosen and curl up, you may trim the loose edges. Do not remove adhesive strips completely unless your health care provider tells you to do that.  Check your incision area every day for signs of infection. Check for: ? More redness, swelling, or pain. ? More fluid or blood. ? Warmth. ? Pus or a bad smell.  Do not take baths, swim, or use a hot tub until your health care provider says it's okay. Ask your health  care provider if you can take showers.  When you cough or sneeze, hug a pillow. This helps with pain and decreases the chance of your incision opening up (dehiscing). Do this until your incision heals.   Medicines  Take over-the-counter and prescription medicines only as told by your health care provider.  If you were prescribed an antibiotic medicine, take it as told by your health care provider. Do not stop taking the antibiotic even if you start to feel better.  Do not drive or use heavy machinery while taking prescription pain medicine. Lifestyle  Do not drink alcohol. This is especially important if you are breastfeeding or taking pain medicine.  Do not use any products that contain nicotine or tobacco, such as cigarettes, e-cigarettes, and chewing tobacco. If you need help quitting, ask your health care provider. Eating and drinking  Drink at least 8 eight-ounce glasses of water every day unless told not to by your health care provider. If you breastfeed, you may need to drink even more water.  Eat high-fiber foods every day. These foods may help prevent or relieve constipation. High-fiber foods include: ? Whole grain cereals and breads. ? Brown rice. ? Beans. ? Fresh fruits and vegetables. Activity  If possible, have someone help you care for your baby and help with household activities for at least a few days after you leave the hospital.  Return to your normal activities as told by your health care provider. Ask your health care provider what activities are safe for   you.  Rest as much as possible. Try to rest or take a nap while your baby is sleeping.  Do not lift anything that is heavier than 10 lbs (4.5 kg), or the limit that you were told, until your health care provider says that it is safe.  Talk with your health care provider about when you can engage in sexual activity. This may depend on your: ? Risk of infection. ? How fast you heal. ? Comfort and desire to  engage in sexual activity.   General instructions  Do not use tampons or douches until your health care provider approves.  Wear loose, comfortable clothing and a supportive and well-fitting bra.  Keep your perineum clean and dry. Wipe from front to back when you use the toilet.  If you pass a blood clot, save it and call your health care provider to discuss. Do not flush blood clots down the toilet before you get instructions from your health care provider.  Keep all follow-up visits for you and your baby as told by your health care provider. This is important. Contact a health care provider if:  You have: ? A fever. ? Bad-smelling vaginal discharge. ? Pus or a bad smell coming from your incision. ? Difficulty or pain when urinating. ? A sudden increase or decrease in the frequency of your bowel movements. ? More redness, swelling, or pain around your incision. ? More fluid or blood coming from your incision. ? A rash. ? Nausea. ? Little or no interest in activities you used to enjoy. ? Questions about caring for yourself or your baby.  Your incision feels warm to the touch.  Your breasts turn red or become painful or hard.  You feel unusually sad or worried.  You vomit.  You pass a blood clot from your vagina.  You urinate more than usual.  You are dizzy or light-headed. Get help right away if:  You have: ? Pain that does not go away or get better with medicine. ? Chest pain. ? Difficulty breathing. ? Blurred vision or spots in your vision. ? Thoughts about hurting yourself or your baby. ? New pain in your abdomen or in one of your legs. ? A severe headache.  You faint.  You bleed from your vagina so much that you fill more than one sanitary pad in one hour. Bleeding should not be heavier than your heaviest period. Summary  After the procedure, it is common to have pain at your incision site, abdominal cramping, and slight bleeding from your vagina.  Check  your incision area every day for signs of infection.  Tell your health care provider about any unusual symptoms.  Keep all follow-up visits for you and your baby as told by your health care provider. This information is not intended to replace advice given to you by your health care provider. Make sure you discuss any questions you have with your health care provider. Document Revised: 05/10/2018 Document Reviewed: 05/10/2018 Elsevier Patient Education  2021 [employer]  

## 2021-01-13 ENCOUNTER — Ambulatory Visit: Payer: Medicaid Other | Admitting: Obstetrics and Gynecology

## 2021-01-13 DIAGNOSIS — Z419 Encounter for procedure for purposes other than remedying health state, unspecified: Secondary | ICD-10-CM | POA: Diagnosis not present

## 2021-02-13 DIAGNOSIS — Z419 Encounter for procedure for purposes other than remedying health state, unspecified: Secondary | ICD-10-CM | POA: Diagnosis not present

## 2021-03-15 DIAGNOSIS — Z419 Encounter for procedure for purposes other than remedying health state, unspecified: Secondary | ICD-10-CM | POA: Diagnosis not present

## 2021-04-15 DIAGNOSIS — Z419 Encounter for procedure for purposes other than remedying health state, unspecified: Secondary | ICD-10-CM | POA: Diagnosis not present

## 2021-04-28 IMAGING — US US MFM FETAL BPP W/O NON-STRESS
1 series · 14 of 26 positions shown · non-contrast
Comparison: none

[Series 1: us mfm fetal bpp w/o non-stress · 26 acquisitions, 14 frames shown]
[im 1/26]
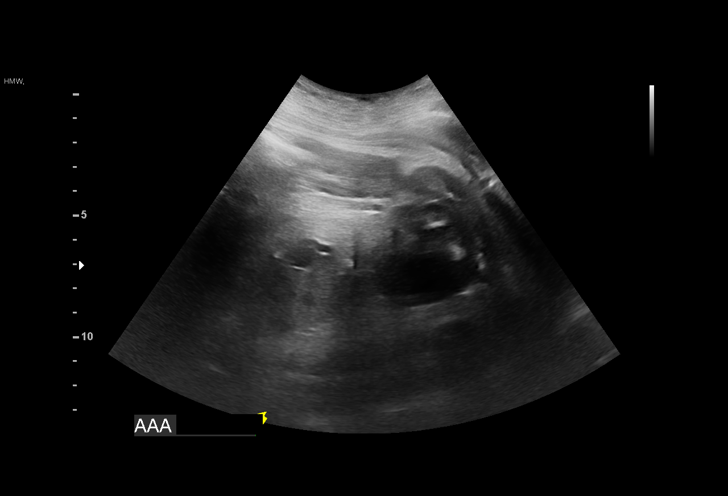
[im 3/26]
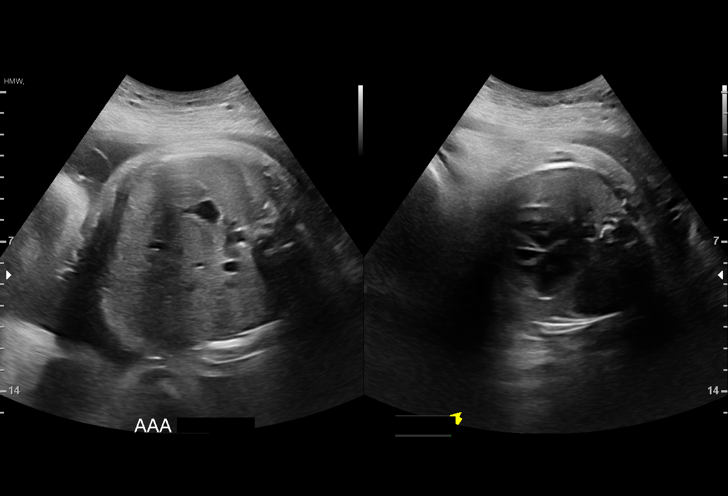
[im 5/26]
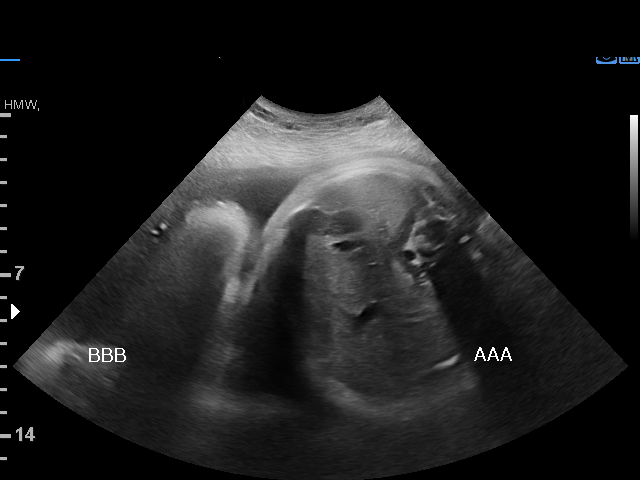
[im 7/26]
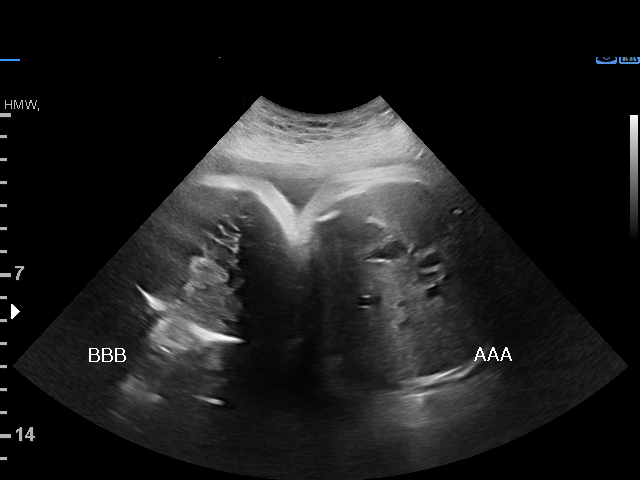
[im 9/26]
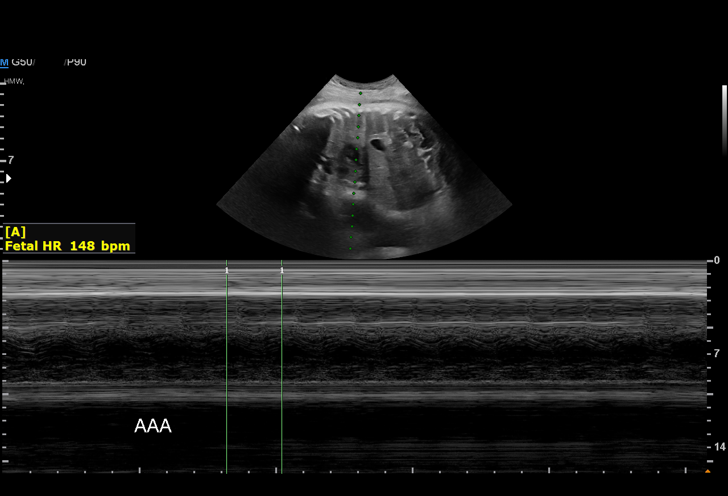
[im 11/26]
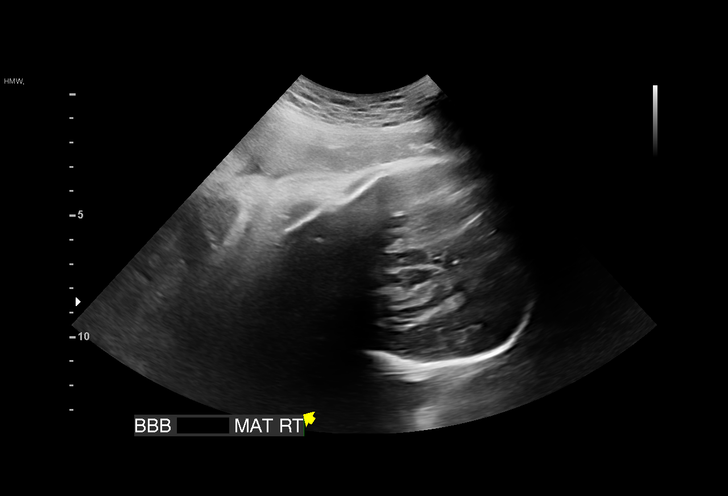
[im 13/26]
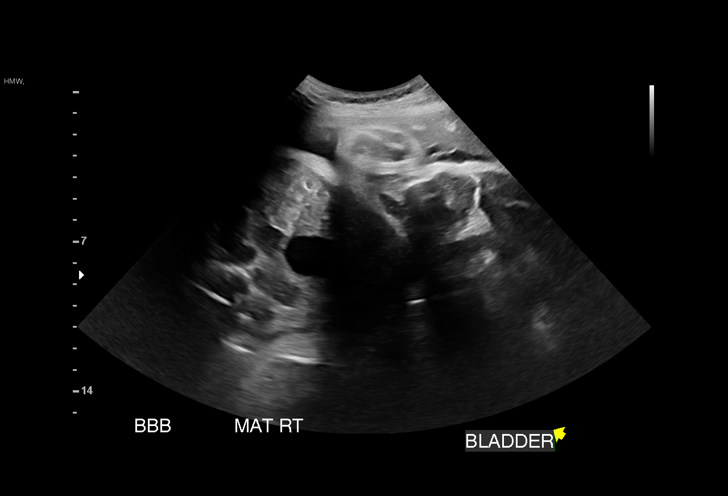
[im 14/26]
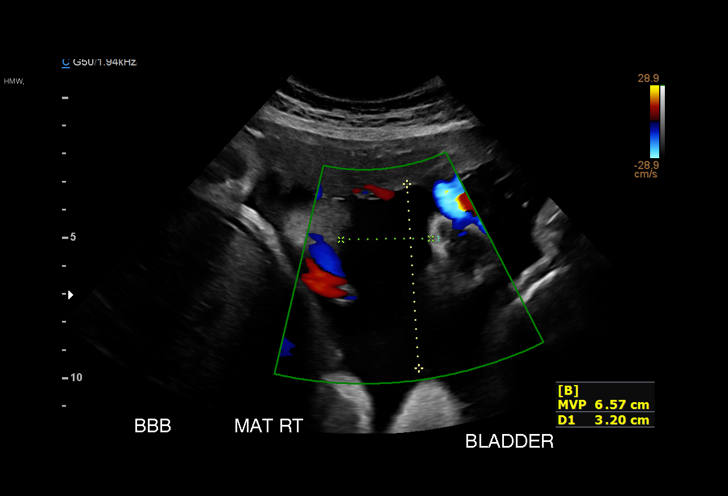
[im 16/26]
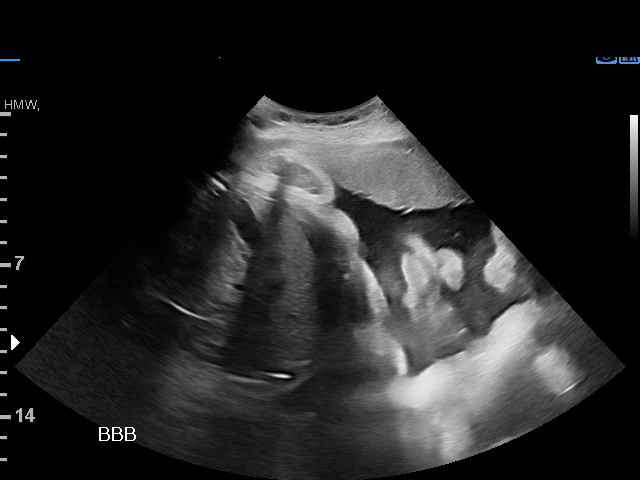
[im 18/26]
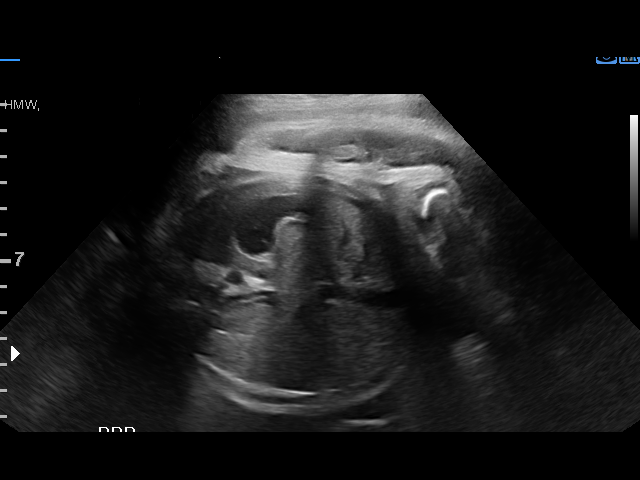
[im 20/26]
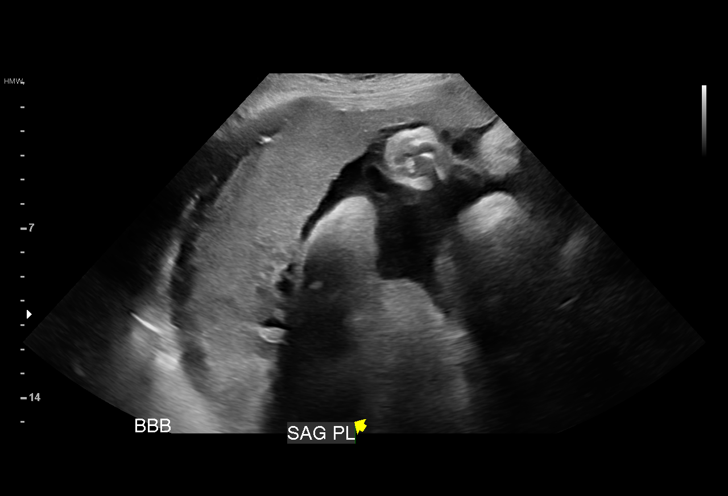
[im 22/26]
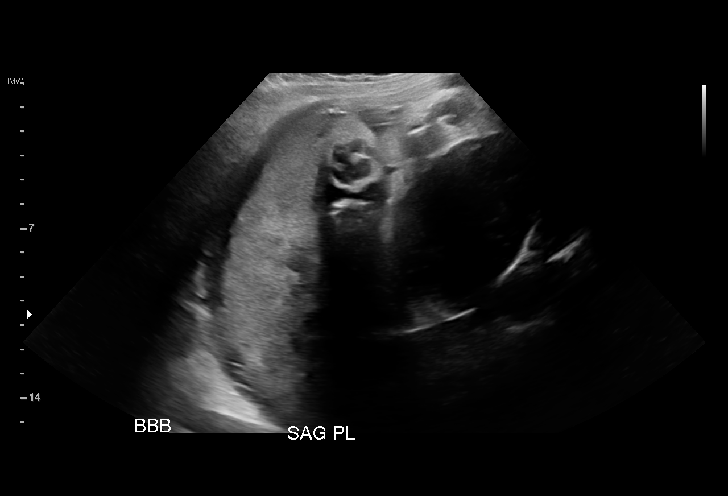
[im 24/26]
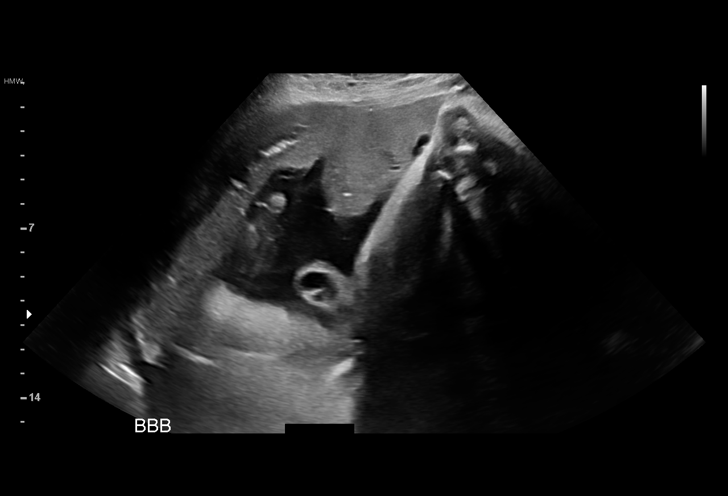
[im 26/26]
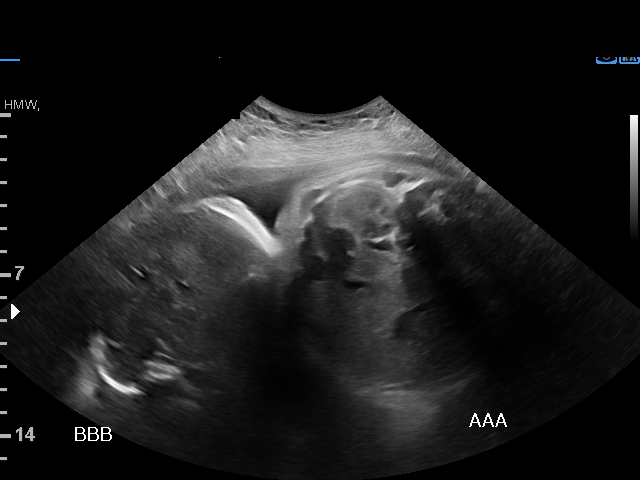

[14 of 26 positions shown; findings below may reference images not displayed]

Attending:        Lkw Tiger      Secondary Phy.:    [REDACTED]
                                                             Healthcare
                   [id number]

    ADDL GESTATION

Indications

 Decreased fetal movement
 36 weeks gestation of pregnancy
 [employer] DID[employer] isoimmunization during pregnancy in
 third trimester, single or unspecified fetus
 Twin pregnancy, di/di, third trimester
 Medical complication of pregnancy
 (Nephrotic Syndrome, A fib)
 History of cesarean delivery, currently
 pregnant x 2
 Poor obstetrical history (hx of
 thrombocytopenia)
 Low Risk NIPS, Neg Horizon
Fetal Evaluation (Fetus A)

 Num Of Fetuses:          2
 Fetal Heart Rate(bpm):   148
 Cardiac Activity:        Observed
 Fetal Lie:               Lower Fetus/Mat LT
 Presentation:            Breech
 Placenta:                Posterior
 Membrane Desc:      Dividing Membrane seen
 Amniotic Fluid
 AFI FV:      Within normal limits

                             Largest Pocket(cm)

Biophysical Evaluation (Fetus A)

 Amniotic F.V:   Within normal limits       F. Tone:         Observed
 F. Movement:    Observed                   Score:           [DATE]
 F. Breathing:   Not Observed
OB History

 Gravidity:    7         Term:   4        Prem:   2        SAB:   0
 TOP:          0       Ectopic:  0        Living: 6
Gestational Age (Fetus A)

 LMP:           36w 4d        Date:  03/24/20                 EDD:   12/29/20
 Best:          36w 4d     Det. By:  LMP  (03/24/20)          EDD:   12/29/20
Anatomy (Fetus A)

 Thoracic:              Appears normal         Bladder:                Appears normal
 Stomach:               Appears normal, left
                        sided

Fetal Evaluation (Fetus B)

 Num Of Fetuses:          2
 Fetal Heart Rate(bpm):   141
 Cardiac Activity:        Observed
 Fetal Lie:               Maternal right side
 Presentation:            Cephalic
 Placenta:                Anterior
 Membrane Desc:      Dividing Membrane seen

 Amniotic Fluid
 AFI FV:      Within normal limits

                             Largest Pocket(cm)

Biophysical Evaluation (Fetus B)

 Amniotic F.V:   Within normal limits       F. Tone:         Observed
 F. Movement:    Observed                   Score:           [DATE]
 F. Breathing:   Not Observed
Gestational Age (Fetus B)

 LMP:           36w 4d        Date:  03/24/20                 EDD:   12/29/20
 Best:          36w 4d     Det. By:  LMP  (03/24/20)          EDD:   12/29/20
Anatomy (Fetus B)

 Thoracic:              Appears normal         Bladder:                Appears normal
 Stomach:               Appears normal, left
                        sided
Impression

 Antenatal testing for dichorionic diamnoitic twin pregnancy
 here for decreasd fetal movement
 Twin A normal stomach, amniotic fluid, bladder and BPP [DATE]-
 Maternal left, Breech
 Twin B normal stomach, amniotic fluid, bladder and BPP [DATE]-
 Maternal right, Cephalic
Recommendations

 Consider NST if NST non reactive consider delivery
 Clinical correlation recommended.

## 2021-05-15 DIAGNOSIS — Z419 Encounter for procedure for purposes other than remedying health state, unspecified: Secondary | ICD-10-CM | POA: Diagnosis not present

## 2021-06-15 DIAGNOSIS — Z419 Encounter for procedure for purposes other than remedying health state, unspecified: Secondary | ICD-10-CM | POA: Diagnosis not present

## 2021-07-06 ENCOUNTER — Emergency Department (HOSPITAL_COMMUNITY): Payer: Medicaid Other

## 2021-07-06 ENCOUNTER — Other Ambulatory Visit: Payer: Self-pay

## 2021-07-06 ENCOUNTER — Emergency Department (HOSPITAL_COMMUNITY)
Admission: EM | Admit: 2021-07-06 | Discharge: 2021-07-06 | Disposition: A | Discharge status: Home / Self Care | Payer: Medicaid Other | Attending: Emergency Medicine | Admitting: Emergency Medicine

## 2021-07-06 ENCOUNTER — Encounter (HOSPITAL_COMMUNITY): Payer: Self-pay

## 2021-07-06 DIAGNOSIS — S92001A Unspecified fracture of right calcaneus, initial encounter for closed fracture: Secondary | ICD-10-CM | POA: Diagnosis not present

## 2021-07-06 DIAGNOSIS — S92011A Displaced fracture of body of right calcaneus, initial encounter for closed fracture: Secondary | ICD-10-CM | POA: Diagnosis not present

## 2021-07-06 DIAGNOSIS — W109XXA Fall (on) (from) unspecified stairs and steps, initial encounter: Secondary | ICD-10-CM | POA: Diagnosis not present

## 2021-07-06 DIAGNOSIS — M7989 Other specified soft tissue disorders: Secondary | ICD-10-CM | POA: Diagnosis not present

## 2021-07-06 DIAGNOSIS — Y9389 Activity, other specified: Secondary | ICD-10-CM | POA: Insufficient documentation

## 2021-07-06 DIAGNOSIS — S99911A Unspecified injury of right ankle, initial encounter: Secondary | ICD-10-CM | POA: Diagnosis present

## 2021-07-06 MED ORDER — OXYCODONE HCL 5 MG PO TABS
5.0000 mg | ORAL_TABLET | Freq: Once | ORAL | Status: AC
  Administered 2021-07-06: 5 mg via ORAL
  Filled 2021-07-06: qty 1

## 2021-07-06 MED ORDER — ACETAMINOPHEN 500 MG PO TABS
1000.0000 mg | ORAL_TABLET | Freq: Once | ORAL | Status: AC
  Administered 2021-07-06: 1000 mg via ORAL
  Filled 2021-07-06: qty 2

## 2021-07-06 MED ORDER — HYDROCODONE-ACETAMINOPHEN 5-325 MG PO TABS: 2.0000 | ORAL_TABLET | ORAL | 0 refills | Status: AC | PRN

## 2021-07-06 NOTE — ED Triage Notes (Signed)
Pt reports twisting her right ankle last night and now endorses right foot, ankle, and leg pain.

## 2021-07-06 NOTE — Discharge Instructions (Addendum)
You were seen in the ER today for your foot and ankle pain after a fall down the stairs yesterday.  You have broken your calcaneus, which is the bone that formed your heel.  This was confirmed with your CT scan.  You have been placed in a splint and you should not bear weight on the leg until you follow-up with the orthopedic surgeon listed below.  You have been provided crutches to use as needed.  Additionally been prescribed a short course of narcotic pain medication called Norco, which is hydrocodone combined with Tylenol.  Please do not take any additional Tylenol when you are taking the Norco, however you may use additional ibuprofen.  Please follow-up with Dr. Carola Frost, the orthopedic listed below.  Please call his office schedule appointment for follow-up within the next week.  Please do not to get your splint wet.  Return to the ER if you develop any new numbness, tingling, weakness in your leg, or any other new severe symptoms.

## 2021-07-06 NOTE — ED Notes (Signed)
Called ortho for splint placement

## 2021-07-06 NOTE — Progress Notes (Signed)
Orthopedic Tech Progress Note Patient Details:  Michele Maldonado 06-05-1986 [id number]  Ortho Devices Type of Ortho Device: Ace wrap, Stirrup splint, Short leg splint Ortho Device/Splint Location: right Ortho Device/Splint Interventions: Application   Post Interventions Patient Tolerated: Well Instructions Provided: Care of device  Saul Fordyce 07/06/2021, 1:04 PM

## 2021-07-06 NOTE — ED Provider Notes (Signed)
[facility]-EMERGENCY DEPT Provider Note   CSN: [id number] Arrival date & time: 07/06/21  1021     History Chief Complaint  Patient presents with   Ankle Pain    Michele Maldonado is a 35 y.o. female who presents with concern for right ankle and foot pain since she fell on the stairs last night.  States she has 7 children at home including intolerant to twin infants and was going upstairs middle the night take a bottle when she fell.  Denies any back pain, head trauma, LOC, nausea, vomiting or blurry, double vision since that time.  I personally read this patient's medical records.  Has history of nephrotic syndrome, mitral valve prolapse, and atrial fibrillation.  She is not anticoagulated.    HPI     Past Medical History:  Diagnosis Date   Anemia    Atrial fibrillation (HCC)    Kell isoimmunization during pregnancy    a. per prior notes - h/o anti Kell alloimmunization.   MVP (mitral valve prolapse)    Thrombocytopenia (HCC)    Ventricular septal defect     Patient Active Problem List   Diagnosis Date Noted   IUD (intrauterine device) in place 12/24/2020   Cesarean delivery delivered 12/11/2020   Dichorionic diamniotic twin pregnancy in third trimester 12/09/2020   [redacted] weeks gestation of pregnancy 10/01/2020   Twin pregnancy 06/12/2020   A-fib (HCC) 05/14/2020   Kell isoimmunization in pregnancy 01/22/2018   Death of child 01-22-2018   Mitral valve prolapse 01/10/2018   History of nephrotic syndrome 10/26/2016   History of 2 cesarean sections 12/30/2013   Gestational thrombocytopenia (HCC) 12/27/2013    Past Surgical History:  Procedure Laterality Date   CESAREAN SECTION N/A 12/29/2013   Procedure: CESAREAN SECTION;  Surgeon: Allie Bossier, MD;  Location: [facility] ORS;  Service: Obstetrics;  Laterality: N/A;   CESAREAN SECTION MULTI-GESTATIONAL N/A 12/09/2020   Procedure: CESAREAN SECTION MULTI-GESTATIONAL AND IUD INSERT;  Surgeon: Levie Heritage, DO;   Location: [facility] OR[facility];  Service: Obstetrics;  Laterality: N/A;   REPAIR VAGINAL CUFF N/A 12/30/2013   Procedure: REPAIR VAGINAL CUFF;  Surgeon: Allie Bossier, MD;  Location: [facility] ORS;  Service: Gynecology;  Laterality: N/A;  Insertion of Bakri Balloon     OB History     Gravida  7   Para  7   Term  5   Preterm  2   AB  0   Living  7      SAB  0   IAB  0   Ectopic  0   Multiple  1   Live Births  8        Obstetric Comments  Child born in March 13, 2015 deceased --- died at 59 months old, taken to ED unresponsive -- pt not ready to talk          Family History  Problem Relation Age of Onset   Hypertension Maternal Grandmother    Diabetes Maternal Grandmother    Thyroid disease Mother    Atrial fibrillation Maternal Grandfather     Social History   Tobacco Use   Smoking status: Never   Smokeless tobacco: Never  Vaping Use   Vaping Use: Never used  Substance Use Topics   Alcohol use: Not Currently   Drug use: No    Types: Marijuana    Comment: former    Home Medications Prior to Admission medications   Medication Sig Start Date End Date Taking? Authorizing  Provider  HYDROcodone-acetaminophen (NORCO/VICODIN) 5-325 MG tablet Take 2 tablets by mouth every 4 (four) hours as needed. 07/06/21  Yes Braxten Memmer, Eugene Gaviaebekah R, PA-C  acetaminophen (TYLENOL) 500 MG tablet Take 2 tablets (1,000 mg total) by mouth every 6 (six) hours. 12/11/20   Gita KudoMarsala, Julia M, MD  coconut oil OIL Apply 1 application topically as needed. Patient not taking: Reported on 12/24/2020 12/11/20   Gita KudoMarsala, Julia M, MD  ferrous sulfate 325 (65 FE) MG tablet Take 1 tablet (325 mg total) by mouth every other day. Patient not taking: Reported on 12/24/2020 12/12/20   Gita KudoMarsala, Julia M, MD  ibuprofen (ADVIL) 800 MG tablet Take 1 tablet (800 mg total) by mouth every 6 (six) hours. Patient not taking: Reported on 12/24/2020 12/11/20   Gita KudoMarsala, Julia M, MD  polyethylene glycol (MIRALAX MIX-IN PAX) 17 g packet Take 17 g by  mouth daily. Patient not taking: Reported on 12/24/2020 12/11/20   Gita KudoMarsala, Julia M, MD  prenatal vitamin w/FE, FA (PRENATAL 1 + 1) 27-1 MG TABS tablet Take 1 tablet by mouth daily at 12 noon. 08/18/20   Conan Bowensavis, Kelly M, MD    Allergies    Patient has no known allergies.  Review of Systems   Review of Systems  Constitutional: Negative.   HENT: Negative.    Respiratory: Negative.    Cardiovascular: Negative.   Gastrointestinal: Negative.   Genitourinary: Negative.   Musculoskeletal:  Positive for arthralgias. Negative for back pain.  Skin: Negative.   Neurological: Negative.    Physical Exam Updated Vital Signs BP 118/64   Pulse 72   Temp 98 F (36.7 C) (Oral)   Resp 18   SpO2 100%   Physical Exam Vitals and nursing note reviewed.  HENT:     Head: Normocephalic and atraumatic.     Mouth/Throat:     Mouth: Mucous membranes are moist.     Pharynx: No oropharyngeal exudate or posterior oropharyngeal erythema.  Eyes:     General: No scleral icterus.       Right eye: No discharge.        Left eye: No discharge.     Conjunctiva/sclera: Conjunctivae normal.  Cardiovascular:     Rate and Rhythm: Normal rate and regular rhythm.     Pulses: Normal pulses.     Heart sounds: Normal heart sounds.  Pulmonary:     Effort: Pulmonary effort is normal. No respiratory distress.     Breath sounds: Normal breath sounds. No wheezing or rales.  Abdominal:     General: Bowel sounds are normal. There is no distension.     Tenderness: There is no abdominal tenderness.  Musculoskeletal:        General: No deformity.     Cervical back: Neck supple. No bony tenderness.     Thoracic back: No bony tenderness.     Lumbar back: No bony tenderness.     Right hip: Normal.     Left hip: Normal.     Right upper leg: Normal.     Left upper leg: Normal.     Right knee: Normal.     Left knee: Normal.     Right lower leg: Normal.     Left lower leg: Normal.     Right ankle: No swelling. Tenderness  present over the lateral malleolus, medial malleolus and ATF ligament. Anterior drawer test negative.     Right Achilles Tendon: Normal.     Left ankle: Normal.     Left Achilles Tendon:  Normal.     Right foot: Normal capillary refill. Swelling and tenderness present. No prominent metatarsal heads, bony tenderness or crepitus. Normal pulse.     Left foot: Normal.  Skin:    General: Skin is warm and dry.  Neurological:     General: No focal deficit present.     Mental Status: She is alert. Mental status is at baseline.  Psychiatric:        Mood and Affect: Mood normal.    ED Results / Procedures / Treatments   Labs (all labs ordered are listed, but only abnormal results are displayed) Labs Reviewed - No data to display  EKG None  Radiology DG Ankle Complete Right  Result Date: 07/06/2021 CLINICAL DATA:  Right foot and ankle pain after injury last night. EXAM: RIGHT FOOT COMPLETE - 3+ VIEW; RIGHT ANKLE - COMPLETE 3+ VIEW COMPARISON:  None. FINDINGS: Acute largely nondisplaced comminuted fracture of the calcaneus with intra-articular extension into the calcaneocuboid and subtalar joints. No additional fracture. No dislocation. Lisfranc alignment is normal. The ankle mortise is symmetric. The talar dome is intact. Joint spaces are preserved. Bone mineralization is normal. Medial hindfoot soft tissue swelling. IMPRESSION: 1. Acute largely nondisplaced comminuted calcaneal fracture with intra-articular extension. Electronically Signed   By: Obie Dredge M.D.   On: 07/06/2021 12:19   CT Foot Right Wo Contrast  Result Date: 07/06/2021 CLINICAL DATA:  Calcaneal fracture EXAM: CT OF THE RIGHT FOOT WITHOUT CONTRAST TECHNIQUE: Multidetector CT imaging of the right foot was performed according to the standard protocol. Multiplanar CT image reconstructions were also generated. COMPARISON:  X-ray 07/06/2021 FINDINGS: Bones/Joint/Cartilage Acute comminuted fracture of the calcaneal body with  longitudinal and transverse fracture planes. Intra-articular extension to the posterior subtalar joint where there is up to 4 mm of articular surface step-off. Fracture line closely approximates the posterior margin of the middle subtalar joint. There is also intra-articular extension to the calcaneocuboid joint with approximately 1 mm of articular surface diastasis. Subtalar and calcaneocuboid joint alignment maintained without dislocation. Ankle mortise is congruent. No fracture of the distal tibia, fibula, or talus. The bones of the midfoot including the cuboid are intact without fracture. Normal appearance and alignment of the tarsometatarsal joints. The osseous structures of the forefoot are intact. No significant arthropathy of right foot. Ligaments Suboptimally assessed by CT. Muscles and Tendons No evidence of acute musculotendinous injury by CT. Soft tissues Soft tissue swelling about the ankle and hindfoot. No organized fluid collection or hematoma. IMPRESSION: Acute comminuted fracture of the right calcaneus, as described above. Electronically Signed   By: Duanne Guess D.O.   On: 07/06/2021 14:47   DG Foot Complete Right  Result Date: 07/06/2021 CLINICAL DATA:  Right foot and ankle pain after injury last night. EXAM: RIGHT FOOT COMPLETE - 3+ VIEW; RIGHT ANKLE - COMPLETE 3+ VIEW COMPARISON:  None. FINDINGS: Acute largely nondisplaced comminuted fracture of the calcaneus with intra-articular extension into the calcaneocuboid and subtalar joints. No additional fracture. No dislocation. Lisfranc alignment is normal. The ankle mortise is symmetric. The talar dome is intact. Joint spaces are preserved. Bone mineralization is normal. Medial hindfoot soft tissue swelling. IMPRESSION: 1. Acute largely nondisplaced comminuted calcaneal fracture with intra-articular extension. Electronically Signed   By: Obie Dredge M.D.   On: 07/06/2021 12:19    Procedures Procedures   Medications Ordered in  ED Medications  acetaminophen (TYLENOL) tablet 1,000 mg (1,000 mg Oral Given 07/06/21 1126)  oxyCODONE (Oxy IR/ROXICODONE) immediate release tablet 5 mg (5  mg Oral Given 07/06/21 1314)    ED Course  I have reviewed the triage vital signs and the nursing notes.  Pertinent labs & imaging results that were available during my care of the patient were reviewed by me and considered in my medical decision making (see chart for details).  Clinical Course as of 07/06/21 1557  Mon Jul 06, 2021  1253 Consult call from Dr. Charlann Boxer, orthopedics, who recommends a CT of the foot, stirrup plus posterior short leg splint, and outpatient follow-up.  Patient should be nonweightbearing.  I appreciate the collaboration of care of this patient. [RS]    Clinical Course User Index [RS] Harjot Zavadil, Eugene Gavia, PA-C   MDM Rules/Calculators/A&P                         35 year old female presents with right ankle and foot pain after fall downstairs.  Differential diagnosis includes not to acute fracture /dislocation, ankle sprain, Planter fasciitis, contusion  Plain film of the ankle and foot revealed the acute largely nondisplaced comminuted calcaneal fracture with intra-articular extension.  Consult to orthopedics as above.  Analgesic offered, patient awaiting CT scan currently undergoing splint.  Patient reevaluated after splint placement, remains neurovascularly intact in the distal foot, endorses improvement in pain with stability from splint.  CT as above with transverse and longitudinal fracture to the calcaneus.  Will discharge with pain medication and outpatient follow-up with orthopedics, may require surgical intervention.  Patient educated regarding precautions with splint, not to get it wet and not to bear weight.  Provided with crutches.  No further work-up warranted in ED as time. Michele Maldonado voiced understanding of her medical evaluation and treatment plan.  Each of her questions was answered to her  expressed satisfaction.  Return precautions were given.  Patient is well-appearing, stable, and appropriate for discharge at this time.  This chart was dictated using voice recognition software, Dragon. Despite the best efforts of this provider to proofread and correct errors, errors may still occur which can change documentation meaning.  Final Clinical Impression(s) / ED Diagnoses Final diagnoses:  Closed displaced fracture of right calcaneus, unspecified portion of calcaneus, initial encounter    Rx / DC Orders ED Discharge Orders          Ordered    HYDROcodone-acetaminophen (NORCO/VICODIN) 5-325 MG tablet  Every 4 hours PRN        07/06/21 1455             Tenika Keeran, Eugene Gavia, PA-C 07/06/21 1559    Sloan Leiter, DO 07/06/21 1637

## 2021-07-07 ENCOUNTER — Telehealth: Payer: Self-pay

## 2021-07-07 NOTE — Telephone Encounter (Signed)
Transition Care Management Unsuccessful Follow-up Telephone Call  Date of discharge and from where:  07/06/2021-[place]   Attempts:  1st Attempt  Reason for unsuccessful TCM follow-up call:  Left voice message

## 2021-07-09 NOTE — Telephone Encounter (Signed)
Transition Care Management Unsuccessful Follow-up Telephone Call  Date of discharge and from where:  07/06/2021-[place]  Attempts:  2nd Attempt  Reason for unsuccessful TCM follow-up call:  Left voice message

## 2021-07-12 NOTE — Telephone Encounter (Signed)
Transition Care Management Unsuccessful Follow-up Telephone Call  Date of discharge and from where:  07/06/2021 from [place]  Attempts:  3rd Attempt  Reason for unsuccessful TCM follow-up call:  Unable to reach patient    

## 2021-07-13 ENCOUNTER — Other Ambulatory Visit: Payer: Self-pay

## 2021-07-13 ENCOUNTER — Ambulatory Visit: Payer: Self-pay | Admitting: Student

## 2021-07-13 ENCOUNTER — Encounter (HOSPITAL_COMMUNITY): Payer: Self-pay | Admitting: Student

## 2021-07-13 DIAGNOSIS — S92061A Displaced intraarticular fracture of right calcaneus, initial encounter for closed fracture: Secondary | ICD-10-CM | POA: Insufficient documentation

## 2021-07-13 DIAGNOSIS — S92001A Unspecified fracture of right calcaneus, initial encounter for closed fracture: Secondary | ICD-10-CM | POA: Diagnosis not present

## 2021-07-13 NOTE — Progress Notes (Signed)
Spoke with pt for pre-op call. Pt states she had A-fib during her most recent pregnancy. States she has not had it since. Hx of "hole in her heart" but it has closed up. Pt states she is not diabetic.   Pt's surgery is scheduled as ambulatory so no Covid test is required prior to surgery.  Sent chart to Anesthesia PA

## 2021-07-14 NOTE — Progress Notes (Signed)
Anesthesia Chart Review: SAME DAY WORK-UP  Case: [id number] Date/Time: 07/15/21 0715   Procedure: OPEN REDUCTION INTERNAL FIXATION (ORIF) CALCANEOUS FRACTURE (Right)   Anesthesia type: General   Diagnosis: Displaced intraarticular fracture of right calcaneus, initial encounter for closed fracture [S92.061A]   Pre-op diagnosis: Right calcaneus fracture   [facility]: [facility] ROOM 07 / [facility]   Surgeons: Roby Lofts, MD       DISCUSSION: Patient is a 35 year old female scheduled for the above procedure. She fell on the stairs and was seen in the ED 07/06/21 for right ankle/foot pain. Imaging revealed right calcaneous fracture. Ortho consulted and recommended splint, non-weight baring and out-patient Ortho follow-up.  History includes never smoker, VSD ("closed spontaneously", no residual VSD 06/16/20 echo), MVP (normal MV structure 06/16/20 echo), afib/PAF (diagnosed 05/09/20 during ED visit, also learned she was pregnant with twins and not started on anticoagulation), Kell isoimmunization during pregnancy (2019), gestational thrombocytopenia, anemia. S/p repeat C-section with IUD insertion for twin pregnancy 12/09/20.  Last EKG is > 78 year old--was SR as of 05/15/20. She is a same day work-up, so labs and EKG on arrival as indicated. Anesthesia team to evaluate on the day of surgery.     VS:  BP Readings from Last 3 Encounters:  07/06/21 118/64  12/24/20 108/72  12/11/20 118/73   Pulse Readings from Last 3 Encounters:  07/06/21 72  12/24/20 87  12/11/20 78     PROVIDERS: Sherryl Manges, MD is EP cardiologist. Last visit 09/19/20 with Francis Dowse, PA-C.    LABS: For day of surgery as indicated.   IMAGES: CT right foot 07/06/21: IMPRESSION: Acute comminuted fracture of the right calcaneus, as described above.   EKG: > 40 year old.  Last EKG noted is from 05/15/20:  Normal sinus rhythm with sinus arrhythmia septal MI, age undetermined Abnormal ECG since last tracing, atrial fibrillation no  longer present Confirmed by Rinaldo Cloud [phone]) on 05/16/2020 3:21:12 PM   CV: Echo 06/16/20: IMPRESSIONS   1. Left ventricular ejection fraction, by estimation, is 55 to 60%. The  left ventricle has normal function. The left ventricle has no regional  wall motion abnormalities. Left ventricular diastolic parameters were  normal. No residual VSD noted.   2. Right ventricular systolic function is normal. The right ventricular  size is normal. There is normal pulmonary artery systolic pressure. The  estimated right ventricular systolic pressure is 26.8 mmHg.   3. The mitral valve is normal in structure. No evidence of mitral valve  regurgitation. No evidence of mitral stenosis.   4. The aortic valve is tricuspid. Aortic valve regurgitation is not  visualized. No aortic stenosis is present.   5. The inferior vena cava is normal in size with <50% respiratory  variability, suggesting right atrial pressure of 8 mmHg.   Past Medical History:  Diagnosis Date   Anemia    Atrial fibrillation (HCC)    Kell isoimmunization during pregnancy    a. per prior notes - h/o anti Kell alloimmunization.   Medical history non-contributory    MVP (mitral valve prolapse)    Thrombocytopenia (HCC)    Ventricular septal defect     Past Surgical History:  Procedure Laterality Date   CESAREAN SECTION N/A 12/29/2013   Procedure: CESAREAN SECTION;  Surgeon: Allie Bossier, MD;  Location: [facility];  Service: Obstetrics;  Laterality: N/A;   CESAREAN SECTION     CESAREAN SECTION MULTI-GESTATIONAL N/A 12/09/2020   Procedure: CESAREAN SECTION MULTI-GESTATIONAL AND IUD INSERT;  Surgeon: Levie Heritage, DO;  Location: [facility];  Service: Obstetrics;  Laterality: N/A;   REPAIR VAGINAL CUFF N/A 12/30/2013   Procedure: REPAIR VAGINAL CUFF;  Surgeon: Allie Bossier, MD;  Location: [facility];  Service: Gynecology;  Laterality: N/A;  Insertion of Bakri Balloon    MEDICATIONS: No current facility-administered medications for  this encounter.    HYDROcodone-acetaminophen (NORCO/VICODIN) 5-325 MG tablet   ibuprofen (ADVIL) 200 MG tablet    Shonna Chock, PA-C Surgical Short Stay/Anesthesiology [facility] Phone [phone] [facility] Phone [phone] 07/14/2021 11:03 AM

## 2021-07-14 NOTE — Anesthesia Preprocedure Evaluation (Addendum)
Anesthesia Evaluation  Patient identified by MRN, date of birth, ID band Patient awake    Reviewed: Allergy & Precautions, NPO status , Patient's Chart, lab work & pertinent test results  Airway Mallampati: II  TM Distance: >3 FB Neck ROM: Full    Dental no notable dental hx.    Pulmonary neg pulmonary ROS,    Pulmonary exam normal breath sounds clear to auscultation       Cardiovascular Normal cardiovascular exam+ dysrhythmias Atrial Fibrillation + Valvular Problems/Murmurs MVP  Rhythm:Regular Rate:Normal  Echo 06/16/20: IMPRESSIONS  1. Left ventricular ejection fraction, by estimation, is 55 to 60%. The  left ventricle has normal function. The left ventricle has no regional  wall motion abnormalities. Left ventricular diastolic parameters were  normal. No residual VSD noted.  2. Right ventricular systolic function is normal. The right ventricular  size is normal. There is normal pulmonary artery systolic pressure. The  estimated right ventricular systolic pressure is 26.8 mmHg.  3. The mitral valve is normal in structure. No evidence of mitral valve  regurgitation. No evidence of mitral stenosis.  4. The aortic valve is tricuspid. Aortic valve regurgitation is not  visualized. No aortic stenosis is present.  5. The inferior vena cava is normal in size with <50% respiratory  variability, suggesting right atrial pressure of 8 mmHg.     Neuro/Psych negative neurological ROS  negative psych ROS   GI/Hepatic negative GI ROS, Neg liver ROS,   Endo/Other  negative endocrine ROS  Renal/GU negative Renal ROS     Musculoskeletal negative musculoskeletal ROS (+)   Abdominal   Peds  Hematology   Anesthesia Other Findings   Reproductive/Obstetrics negative OB ROS                           Anesthesia Physical Anesthesia Plan  ASA: 2  Anesthesia Plan: General and Regional   Post-op Pain  Management: GA combined w/ Regional for post-op pain   Induction: Intravenous  PONV Risk Score and Plan: 3  Airway Management Planned: Oral ETT  Additional Equipment: None  Intra-op Plan:   Post-operative Plan: Extubation in OR  Informed Consent: I have reviewed the patients History and Physical, chart, labs and discussed the procedure including the risks, benefits and alternatives for the proposed anesthesia with the patient or authorized representative who has indicated his/her understanding and acceptance.     Dental advisory given  Plan Discussed with: CRNA and Anesthesiologist  Anesthesia Plan Comments: (PAT note written 07/14/2021 by Shonna Chock, PA-C. )       Anesthesia Quick Evaluation

## 2021-07-15 ENCOUNTER — Ambulatory Visit (HOSPITAL_COMMUNITY): Payer: Medicaid Other | Admitting: Vascular Surgery

## 2021-07-15 ENCOUNTER — Encounter (HOSPITAL_COMMUNITY): Payer: Self-pay | Admitting: Student

## 2021-07-15 ENCOUNTER — Ambulatory Visit (HOSPITAL_COMMUNITY)
Admission: RE | Admit: 2021-07-15 | Discharge: 2021-07-15 | Disposition: A | Discharge status: Home / Self Care | Payer: Medicaid Other | Attending: Student | Admitting: Student

## 2021-07-15 ENCOUNTER — Ambulatory Visit (HOSPITAL_COMMUNITY): Payer: Medicaid Other

## 2021-07-15 ENCOUNTER — Encounter (HOSPITAL_COMMUNITY)
Admission: RE | Disposition: A | Discharge status: Home / Self Care | Payer: Self-pay | Source: Home / Self Care | Attending: Student

## 2021-07-15 ENCOUNTER — Other Ambulatory Visit: Payer: Self-pay

## 2021-07-15 DIAGNOSIS — I341 Nonrheumatic mitral (valve) prolapse: Secondary | ICD-10-CM | POA: Diagnosis not present

## 2021-07-15 DIAGNOSIS — S92001D Unspecified fracture of right calcaneus, subsequent encounter for fracture with routine healing: Secondary | ICD-10-CM | POA: Diagnosis not present

## 2021-07-15 DIAGNOSIS — W109XXA Fall (on) (from) unspecified stairs and steps, initial encounter: Secondary | ICD-10-CM | POA: Insufficient documentation

## 2021-07-15 DIAGNOSIS — S92061A Displaced intraarticular fracture of right calcaneus, initial encounter for closed fracture: Secondary | ICD-10-CM | POA: Diagnosis not present

## 2021-07-15 DIAGNOSIS — G8918 Other acute postprocedural pain: Secondary | ICD-10-CM | POA: Diagnosis not present

## 2021-07-15 DIAGNOSIS — Z419 Encounter for procedure for purposes other than remedying health state, unspecified: Secondary | ICD-10-CM

## 2021-07-15 DIAGNOSIS — T148XXA Other injury of unspecified body region, initial encounter: Secondary | ICD-10-CM

## 2021-07-15 DIAGNOSIS — I4891 Unspecified atrial fibrillation: Secondary | ICD-10-CM | POA: Diagnosis not present

## 2021-07-15 DIAGNOSIS — S92001A Unspecified fracture of right calcaneus, initial encounter for closed fracture: Secondary | ICD-10-CM | POA: Diagnosis not present

## 2021-07-15 HISTORY — DX: Other specified health status: Z78.9

## 2021-07-15 HISTORY — PX: ORIF CALCANEOUS FRACTURE: SHX5030

## 2021-07-15 LAB — CBC
HCT: 39.6 % (ref 36.0–46.0)
Hemoglobin: 13.1 g/dL (ref 12.0–15.0)
MCH: 30.5 pg (ref 26.0–34.0)
MCHC: 33.1 g/dL (ref 30.0–36.0)
MCV: 92.3 fL (ref 80.0–100.0)
Platelets: 176 10*3/uL (ref 150–400)
RBC: 4.29 MIL/uL (ref 3.87–5.11)
RDW: 14.3 % (ref 11.5–15.5)
WBC: 11.6 10*3/uL — ABNORMAL HIGH (ref 4.0–10.5)
nRBC: 0 % (ref 0.0–0.2)

## 2021-07-15 LAB — POCT PREGNANCY, URINE: Preg Test, Ur: NEGATIVE

## 2021-07-15 SURGERY — OPEN REDUCTION INTERNAL FIXATION (ORIF) CALCANEOUS FRACTURE
Anesthesia: Regional | Site: Foot | Laterality: Right

## 2021-07-15 MED ORDER — ONDANSETRON HCL 4 MG PO TABS: 4.0000 mg | ORAL_TABLET | Freq: Three times a day (TID) | ORAL | 0 refills | Status: AC | PRN

## 2021-07-15 MED ORDER — SUGAMMADEX SODIUM 200 MG/2ML IV SOLN
INTRAVENOUS | Status: DC | PRN
  Administered 2021-07-15: 200 mg via INTRAVENOUS

## 2021-07-15 MED ORDER — ACETAMINOPHEN 500 MG PO TABS
1000.0000 mg | ORAL_TABLET | Freq: Once | ORAL | Status: AC
  Administered 2021-07-15: 1000 mg via ORAL
  Filled 2021-07-15: qty 2

## 2021-07-15 MED ORDER — PROPOFOL 10 MG/ML IV BOLUS
INTRAVENOUS | Status: DC | PRN
  Administered 2021-07-15: 200 mg via INTRAVENOUS

## 2021-07-15 MED ORDER — OXYCODONE HCL 5 MG PO TABS: 5.0000 mg | ORAL_TABLET | Freq: Once | ORAL | Status: DC | PRN

## 2021-07-15 MED ORDER — VANCOMYCIN HCL 1000 MG IV SOLR
INTRAVENOUS | Status: AC
  Filled 2021-07-15: qty 20

## 2021-07-15 MED ORDER — LIDOCAINE 2% (20 MG/ML) 5 ML SYRINGE
INTRAMUSCULAR | Status: DC | PRN
Start: 2021-07-15 — End: 2021-07-15
  Administered 2021-07-15: 80 mg via INTRAVENOUS

## 2021-07-15 MED ORDER — CHLORHEXIDINE GLUCONATE 0.12 % MT SOLN
OROMUCOSAL | Status: AC
  Administered 2021-07-15: 15 mL via OROMUCOSAL
  Filled 2021-07-15: qty 15

## 2021-07-15 MED ORDER — ROCURONIUM BROMIDE 10 MG/ML (PF) SYRINGE
PREFILLED_SYRINGE | INTRAVENOUS | Status: DC | PRN
  Administered 2021-07-15: 70 mg via INTRAVENOUS

## 2021-07-15 MED ORDER — PROPOFOL 10 MG/ML IV BOLUS
INTRAVENOUS | Status: AC
  Filled 2021-07-15: qty 20

## 2021-07-15 MED ORDER — ONDANSETRON HCL 4 MG/2ML IJ SOLN
INTRAMUSCULAR | Status: DC | PRN
  Administered 2021-07-15: 4 mg via INTRAVENOUS

## 2021-07-15 MED ORDER — ROPIVACAINE HCL 5 MG/ML IJ SOLN
INTRAMUSCULAR | Status: DC | PRN
  Administered 2021-07-15: 45 mL via PERINEURAL

## 2021-07-15 MED ORDER — METHOCARBAMOL 500 MG PO TABS: 500.0000 mg | ORAL_TABLET | Freq: Four times a day (QID) | ORAL | 0 refills | Status: AC | PRN

## 2021-07-15 MED ORDER — FENTANYL CITRATE (PF) 100 MCG/2ML IJ SOLN: 25.0000 ug | INTRAMUSCULAR | Status: DC | PRN

## 2021-07-15 MED ORDER — FENTANYL CITRATE (PF) 250 MCG/5ML IJ SOLN
INTRAMUSCULAR | Status: AC
  Filled 2021-07-15: qty 5

## 2021-07-15 MED ORDER — 0.9 % SODIUM CHLORIDE (POUR BTL) OPTIME
TOPICAL | Status: DC | PRN
  Administered 2021-07-15: 1000 mL

## 2021-07-15 MED ORDER — DEXAMETHASONE SODIUM PHOSPHATE 10 MG/ML IJ SOLN
INTRAMUSCULAR | Status: DC | PRN
  Administered 2021-07-15: 4 mg via INTRAVENOUS

## 2021-07-15 MED ORDER — CEFAZOLIN SODIUM-DEXTROSE 2-4 GM/100ML-% IV SOLN
2.0000 g | INTRAVENOUS | Status: AC
  Administered 2021-07-15: 2 g via INTRAVENOUS
  Filled 2021-07-15: qty 100

## 2021-07-15 MED ORDER — FENTANYL CITRATE (PF) 100 MCG/2ML IJ SOLN
INTRAMUSCULAR | Status: DC | PRN
  Administered 2021-07-15 (×3): 50 ug via INTRAVENOUS

## 2021-07-15 MED ORDER — ORAL CARE MOUTH RINSE: 15.0000 mL | Freq: Once | OROMUCOSAL | Status: AC

## 2021-07-15 MED ORDER — CHLORHEXIDINE GLUCONATE 0.12 % MT SOLN: 15.0000 mL | Freq: Once | OROMUCOSAL | Status: AC

## 2021-07-15 MED ORDER — MIDAZOLAM HCL 2 MG/2ML IJ SOLN
INTRAMUSCULAR | Status: AC
  Filled 2021-07-15: qty 2

## 2021-07-15 MED ORDER — ROCURONIUM BROMIDE 10 MG/ML (PF) SYRINGE
PREFILLED_SYRINGE | INTRAVENOUS | Status: AC
  Filled 2021-07-15: qty 10

## 2021-07-15 MED ORDER — VANCOMYCIN HCL 1000 MG IV SOLR
INTRAVENOUS | Status: DC | PRN
  Administered 2021-07-15: 1000 mg via TOPICAL

## 2021-07-15 MED ORDER — MIDAZOLAM HCL 2 MG/2ML IJ SOLN
INTRAMUSCULAR | Status: DC | PRN
  Administered 2021-07-15: 2 mg via INTRAVENOUS

## 2021-07-15 MED ORDER — OXYCODONE HCL 5 MG/5ML PO SOLN: 5.0000 mg | Freq: Once | ORAL | Status: DC | PRN

## 2021-07-15 MED ORDER — PROMETHAZINE HCL 25 MG/ML IJ SOLN: 6.2500 mg | INTRAMUSCULAR | Status: DC | PRN

## 2021-07-15 MED ORDER — DROPERIDOL 2.5 MG/ML IJ SOLN
0.6250 mg | Freq: Once | INTRAMUSCULAR | Status: DC | PRN
Start: 2021-07-15 — End: 2021-07-15

## 2021-07-15 MED ORDER — ASPIRIN EC 325 MG PO TBEC: 325.0000 mg | DELAYED_RELEASE_TABLET | Freq: Two times a day (BID) | ORAL | 0 refills | Status: AC

## 2021-07-15 MED ORDER — LACTATED RINGERS IV SOLN: INTRAVENOUS | Status: DC

## 2021-07-15 SURGICAL SUPPLY — 64 items
BAG COUNTER SPONGE SURGICOUNT (BAG) ×2 IMPLANT
BANDAGE ESMARK 6X9 LF (GAUZE/BANDAGES/DRESSINGS) ×1 IMPLANT
BIT DRILL 2.0X130MM (DRILL) ×1 IMPLANT
BLADE SURG 10 STRL SS (BLADE) ×2 IMPLANT
BNDG COHESIVE 4X5 TAN STRL (GAUZE/BANDAGES/DRESSINGS) ×2 IMPLANT
BNDG ELASTIC 4X5.8 VLCR STR LF (GAUZE/BANDAGES/DRESSINGS) ×2 IMPLANT
BNDG ELASTIC 6X5.8 VLCR STR LF (GAUZE/BANDAGES/DRESSINGS) ×2 IMPLANT
BNDG ESMARK 6X9 LF (GAUZE/BANDAGES/DRESSINGS) ×2
BRUSH SCRUB EZ PLAIN DRY (MISCELLANEOUS) ×4 IMPLANT
CHLORAPREP W/TINT 26 (MISCELLANEOUS) ×2 IMPLANT
CONNECTOR 5 IN 1 STRAIGHT STRL (MISCELLANEOUS) ×2 IMPLANT
COVER MAYO STAND STRL (DRAPES) ×2 IMPLANT
COVER SURGICAL LIGHT HANDLE (MISCELLANEOUS) ×4 IMPLANT
DRAPE C-ARM 42X72 X-RAY (DRAPES) ×2 IMPLANT
DRAPE C-ARMOR (DRAPES) ×2 IMPLANT
DRAPE INCISE IOBAN 66X45 STRL (DRAPES) ×2 IMPLANT
DRAPE ORTHO SPLIT 77X108 STRL (DRAPES) ×2
DRAPE SURG ORHT 6 SPLT 77X108 (DRAPES) ×2 IMPLANT
DRAPE U-SHAPE 47X51 STRL (DRAPES) ×2 IMPLANT
DRILL 2.0X130MM (DRILL) ×2
DRSG EMULSION OIL 3X3 NADH (GAUZE/BANDAGES/DRESSINGS) ×2 IMPLANT
ELECT REM PT RETURN 9FT ADLT (ELECTROSURGICAL) ×2
ELECTRODE REM PT RTRN 9FT ADLT (ELECTROSURGICAL) ×1 IMPLANT
GAUZE SPONGE 4X4 12PLY STRL (GAUZE/BANDAGES/DRESSINGS) ×2 IMPLANT
GLOVE SURG ENC MOIS LTX SZ6.5 (GLOVE) ×6 IMPLANT
GLOVE SURG ENC MOIS LTX SZ7.5 (GLOVE) ×8 IMPLANT
GLOVE SURG UNDER POLY LF SZ6.5 (GLOVE) ×2 IMPLANT
GLOVE SURG UNDER POLY LF SZ7.5 (GLOVE) ×2 IMPLANT
GOWN STRL REUS W/ TWL LRG LVL3 (GOWN DISPOSABLE) ×2 IMPLANT
GOWN STRL REUS W/TWL LRG LVL3 (GOWN DISPOSABLE) ×2
HALF PIN SHORT 5X40MM (Pin) ×2 IMPLANT
KIT BASIN OR (CUSTOM PROCEDURE TRAY) ×2 IMPLANT
KIT TURNOVER KIT B (KITS) ×2 IMPLANT
MANIFOLD NEPTUNE II (INSTRUMENTS) ×2 IMPLANT
NEEDLE HYPO 21X1.5 SAFETY (NEEDLE) IMPLANT
NS IRRIG 1000ML POUR BTL (IV SOLUTION) ×2 IMPLANT
PACK ORTHO EXTREMITY (CUSTOM PROCEDURE TRAY) ×2 IMPLANT
PAD ARMBOARD 7.5X6 YLW CONV (MISCELLANEOUS) ×4 IMPLANT
PAD CAST 4YDX4 CTTN HI CHSV (CAST SUPPLIES) ×1 IMPLANT
PADDING CAST COTTON 4X4 STRL (CAST SUPPLIES) ×1
PADDING CAST COTTON 6X4 STRL (CAST SUPPLIES) ×2 IMPLANT
PLATE PERC VA 2.7X33.5 LT (Plate) ×2 IMPLANT
SCREW 2.7X26 (Screw) ×2 IMPLANT
SCREW LOCK CORT ST 2.7X24 (Screw) ×4 IMPLANT
SCREW LOCK CORT ST 2.7X32 (Screw) ×6 IMPLANT
SCREW NLOCK VA 2.7X26 (Screw) ×2 IMPLANT
SCREW NLOCK VA 2.7X30 (Screw) ×2 IMPLANT
SCREW NLOCK VA 2.7X32 (Screw) ×2 IMPLANT
SCREW SHANZ 4.0X60MM (EXFIX) IMPLANT
SPONGE T-LAP 18X18 ~~LOC~~+RFID (SPONGE) IMPLANT
SUCTION FRAZIER HANDLE 10FR (MISCELLANEOUS) ×1
SUCTION TUBE FRAZIER 10FR DISP (MISCELLANEOUS) ×1 IMPLANT
SUT ETHILON 3 0 FSL (SUTURE) ×2 IMPLANT
SUT ETHILON 3 0 PS 1 (SUTURE) ×4 IMPLANT
SUT VIC AB 0 CT1 27 (SUTURE) ×1
SUT VIC AB 0 CT1 27XBRD ANBCTR (SUTURE) ×1 IMPLANT
SUT VIC AB 2-0 CT1 27 (SUTURE) ×1
SUT VIC AB 2-0 CT1 TAPERPNT 27 (SUTURE) ×1 IMPLANT
SYR CONTROL 10ML LL (SYRINGE) IMPLANT
TOWEL GREEN STERILE (TOWEL DISPOSABLE) ×4 IMPLANT
TOWEL GREEN STERILE FF (TOWEL DISPOSABLE) ×2 IMPLANT
TUBE CONNECTING 12X1/4 (SUCTIONS) ×4 IMPLANT
UNDERPAD 30X36 HEAVY ABSORB (UNDERPADS AND DIAPERS) ×2 IMPLANT
WATER STERILE IRR 1000ML POUR (IV SOLUTION) ×2 IMPLANT

## 2021-07-15 NOTE — Discharge Instructions (Addendum)
Michele Hamming, MD Michele Pace, PA-C [facility] [phone] [name] (fax)                                  POST-OPERATIVE INSTRUCTIONS   WEIGHT BEARING STATUS: Non-weightbearing right lower extremity  RANGE OF MOTION/ACTIVITY: Ok for knee motion as tolerated  WOUND CARE Please keep splint clean dry and intact until follow-up. If your splint gets wet for any reason please contact the office immediately.  Do not stick anything down your splint such as pencils, momey, hangers to try and scratch yourself.  If you feel itchy take Benadryl as prescribed on the bottle for itching You may shower on Post-Op Day #2.  You must keep splint dry during this process and may find that a plastic bag taped around the extremity or alternatively a towel based bath may be a better option.   If you get your splint wet or if it is damaged please contact our clinic.  EXERCISES Due to your splint being in place you will not be able to bear weight through your extremity.   DO NOT PUT ANY WEIGHT ON YOUR OPERATIVE LEG Please use crutches or a walker to avoid weight bearing.   DVT/PE prophylaxis: Aspirin 325 mg twice daily x 30 days  DIET: As you were eating previously.  Can use over the counter stool softeners and bowel preparations, such as Miralax, to help with bowel movements.  Narcotics can be constipating.  Be sure to drink plenty of fluids  REGIONAL ANESTHESIA (NERVE BLOCKS) The anesthesia team may have performed a nerve block for you if safe in the setting of your care.  This is a great tool used to minimize pain.  Typically the block may start wearing off overnight but the long acting medicine may last for 3-4 days.  The nerve block wearing off can be a challenging period but please utilize your as needed pain medications to try and manage this period.    POST-OP MEDICATIONS- Multimodal approach to pain control  In general your pain will be  controlled with a combination of substances.  Prescriptions unless otherwise discussed are electronically sent to your pharmacy.  This is a carefully made plan we use to minimize narcotic use.     - Acetaminophen - Non-narcotic pain medicine taken on a scheduled basis   - Hydrocodone - This is a strong narcotic, to be used only on an "as needed" basis for pain.  -  Aspirin 325 mg - This medicine is used to minimize the risk of blood clots after surgery  - Zofran - To be taken as needed for nausea post-operatively  FOLLOW-UP If you develop a Fever (>101.5), Redness or Drainage from the surgical incision site, please call our office to arrange for an evaluation. Please call the office to schedule a follow-up appointment for your incision check if you do not already have one, 7-10 days post-operatively.   VISIT OUR WEBSITE FOR ADDITIONAL INFORMATION: [email or web address]  HELPFUL INFORMATION  If you had a block, it will wear off between 8-24 hrs postop typically.  This is period when your pain may go from nearly zero to the pain you would have had postop without the block.  This is an abrupt transition but nothing dangerous is happening.  You may take an extra dose of narcotic when this happens.  You should wean off your narcotic  medicines as soon as you are able.  Most patients will be off or using minimal narcotics before their first postop appointment.   We suggest you use the pain medication the first night prior to going to bed, in order to ease any pain when the anesthesia wears off. You should avoid taking pain medications on an empty stomach as it will make you nauseous.  Do not drink alcoholic beverages or take illicit drugs when taking pain medications.  In most states it is against the law to drive while you are in a splint or sling.  And certainly against the law to drive while taking narcotics.  You may return to work/school in the next couple of days when you feel up to it.   Pain  medication may make you constipated.  Below are a few solutions to try in this order: Decrease the amount of pain medication if you aren't having pain. Drink lots of decaffeinated fluids. Drink prune juice and/or each dried prunes  If the first 3 don't work start with additional solutions Take Colace - an over-the-counter stool softener Take Senokot - an over-the-counter laxative Take Miralax - a stronger over-the-counter laxative    FOR MEDICATION REFILLS PLEASE CALL THE OFFICE

## 2021-07-15 NOTE — H&P (Signed)
[facility] ([facility]) Consult   Patient ID: Michele Maldonado MRN: [id number] DOB/AGE: 33-Jul-1987 33 y.o.  Reason for Surgery: Right calcaneus fracture  HPI: Michele Maldonado is an 35 y.o. female who presents for surgery on her right calcaneus.  The patient fell down the steps injuring her calcaneus.  She was a found to have a Sanders 2 intra-articular calcaneus fracture.  Due to the unstable nature of her injury I recommended proceeding with open reduction internal fixation.  She presents for surgery today.  No recent illnesses otherwise feels well.  Past Medical History:  Diagnosis Date   Anemia    Atrial fibrillation ([place])    Kell isoimmunization during pregnancy    a. per prior notes - h/o anti Kell alloimmunization.   Medical history non-contributory    MVP (mitral valve prolapse)    Thrombocytopenia ([facility])    Ventricular septal defect     Past Surgical History:  Procedure Laterality Date   CESAREAN SECTION N/A 12/29/2013   Procedure: CESAREAN SECTION;  Surgeon: Emily Filbert, MD;  Location: [facility];  Service: Obstetrics;  Laterality: N/A;   CESAREAN SECTION     CESAREAN SECTION MULTI-GESTATIONAL N/A 12/09/2020   Procedure: CESAREAN SECTION MULTI-GESTATIONAL AND IUD INSERT;  Surgeon: Truett Mainland, DO;  Location: [facility];  Service: Obstetrics;  Laterality: N/A;   REPAIR VAGINAL CUFF N/A 12/30/2013   Procedure: REPAIR VAGINAL CUFF;  Surgeon: Emily Filbert, MD;  Location: [facility];  Service: Gynecology;  Laterality: N/A;  Insertion of Bakri Balloon    Family History  Problem Relation Age of Onset   Hypertension Maternal Grandmother    Diabetes Maternal Grandmother    Thyroid disease Mother    Atrial fibrillation Maternal Grandfather     Social History:  reports that she has never smoked. She has never used smokeless tobacco. She reports that she does not currently use alcohol. She reports that she does not use drugs.  Allergies: No Known Allergies  Medications:   No current facility-administered medications on file prior to encounter.   Current Outpatient Medications on File Prior to Encounter  Medication Sig Dispense Refill   HYDROcodone-acetaminophen (NORCO/VICODIN) 5-325 MG tablet Take 2 tablets by mouth every 4 (four) hours as needed. (Patient taking differently: Take 1 tablet by mouth every 4 (four) hours as needed.) 10 tablet 0   ibuprofen (ADVIL) 200 MG tablet Take 200 mg by mouth every 6 (six) hours as needed for moderate pain.       ROS: Constitutional: No fever or chills Vision: No changes in vision ENT: No difficulty swallowing CV: No chest pain Pulm: No SOB or wheezing GI: No nausea or vomiting GU: No urgency or inability to hold urine Skin: No poor wound healing Neurologic: No numbness or tingling Psychiatric: No depression or anxiety Heme: No bruising Allergic: No reaction to medications or food   Exam: Blood pressure (!) 111/58, pulse 63, temperature 97.8 F (36.6 C), temperature source Oral, resp. rate 18, height _0  (1.727 m), weight 81.6 kg, SpO2 99 %, currently breastfeeding. General: No acute distress Orientation: Awake alert and oriented x3 Mood and Affect: Cooperative and pleasant Gait: Unable to assess due to her fracture Coordination and balance: Within normal limits  Right lower extremity: Splint is in place clean dry and intact.  Compartments soft compressible.  Appropriate skin wrinkling along the sinus tarsi region.  Motor and sensory function intact.  Left lower extremity: Skin without lesions. No tenderness to palpation. Full painless ROM, full  strength in each muscle groups without evidence of instability.   Medical Decision Making: Data: Imaging: X-rays and CT scan of the right calcaneus are reviewed which shows a Sanders 2 intra-articular calcaneus fracture with loss of structural architecture of the tuberosity.  Labs:  Results for orders placed or performed during the hospital encounter of  07/15/21 (from the past 24 hour(s))  CBC     Status: Abnormal   Collection Time: 07/15/21  6:03 AM  Result Value Ref Range   WBC 11.6 (H) 4.0 - 10.5 K/uL   RBC 4.29 3.87 - 5.11 MIL/uL   Hemoglobin 13.1 12.0 - 15.0 g/dL   HCT 39.6 36.0 - 46.0 %   MCV 92.3 80.0 - 100.0 fL   MCH 30.5 26.0 - 34.0 pg   MCHC 33.1 30.0 - 36.0 g/dL   RDW 14.3 11.5 - 15.5 %   Platelets 176 150 - 400 K/uL   nRBC 0.0 0.0 - 0.2 %  Pregnancy, urine POC     Status: None   Collection Time: 07/15/21  6:58 AM  Result Value Ref Range   Preg Test, Ur NEGATIVE NEGATIVE     Imaging or Labs ordered: None  Medical history and chart was reviewed and case discussed with medical provider.  Assessment/Plan: 35 year old female with intra-articular right calcaneus fracture.  Due to the unstable nature of her injury I recommended proceeding with open reduction internal fixation.  Risks and benefits were discussed with the patient.  Risks included but not limited to bleeding, infection, malunion, nonunion, hardware failure, hardware irritation, nerve or blood vessel injury, posttraumatic arthritis, stiffness, DVT, even the possibility anesthetic complications.  She agreed to proceed with surgery and consent was obtained.  Shona Needles, MD [facility] [phone] (office) [email or web address]

## 2021-07-15 NOTE — Anesthesia Procedure Notes (Signed)
Anesthesia Regional Block: Popliteal block   Pre-Anesthetic Checklist: , timeout performed,  Correct Patient, Correct Site, Correct Laterality,  Correct Procedure, Correct Position, site marked,  Risks and benefits discussed,  Surgical consent,  Pre-op evaluation,  At surgeon's request and post-op pain management  Laterality: Right  Prep: chloraprep       Needles:  Injection technique: Single-shot  Needle Type: Echogenic Stimulator Needle     Needle Length: 10cm  Needle Gauge: 20     Additional Needles:   Narrative:  Start time: 07/15/2021 7:00 AM End time: 07/15/2021 7:15 AM Injection made incrementally with aspirations every 5 mL.  Performed by: Personally  Anesthesiologist: Mellody Dance, MD  Additional Notes: A functioning IV was confirmed and monitors were applied.  Sterile prep and drape, hand hygiene and sterile gloves were used.  Negative aspiration and test dose prior to incremental administration of local anesthetic. The patient tolerated the procedure well.Ultrasound  guidance: relevant anatomy identified, needle position confirmed, local anesthetic spread visualized around nerve(s), vascular puncture avoided.  Image printed for medical record.

## 2021-07-15 NOTE — Op Note (Signed)
Orthopaedic Surgery Operative Note (CSN: [id number] ) Date of Surgery: 07/15/2021  Admit Date: 07/15/2021   Diagnoses: Pre-Op Diagnoses: Right Sanders 2 calcaneus fracture  Post-Op Diagnosis: Same  Procedures: CPT 28415-Open reduction internal fixation of right calcaneus  Surgeons : Primary: Shona Needles, MD  Assistant: [name], PA-C  Location: OR 7  Anesthesia:General with regional block  Antibiotics: Ancef 2g preop with 1 gm vancomycin powder placed topically   Tourniquet time: Total Tourniquet Time Documented: Thigh (Right) - 61 minutes Total: Thigh (Right) - 61 minutes    Estimated Blood Loss: Minimal  Complications: None  Specimens:None   Implants: Implant Name Type Inv. Item Serial No. Manufacturer Lot No. LRB No. Used Action  vlp 2.20m percutaneous calcaneus plate right     [id number] 1 Implanted  HALF PIN SHORT 5X40MM - [id number] HALF PIN SHORT 5X40MM  [employer]  Right 1 Implanted  2.7 x 327mcortex screw      Right 1 Implanted  SCREW 2.7X26 - - [id number] SCREW 2.7X26  [employer]  Right 1 Implanted  2.7 x 3038mcrew      Right 1 Implanted  SCREW LOCK CORT ST 2.7X24 - [id number] SCREW LOCK CORT ST 2.7X24  [employer]  Right 1 Implanted  SCREW LOCK CORT ST 2.7X32 - [id number]- [id number] SCREW LOCK CORT ST 2.7X32  [employer]  Right 3 Implanted  2.7X26 locking screw    [employer]  Right 1 Implanted     Indications for Surgery: 35 5ar old female who sustained a right intra-articular calcaneus fracture.  Due to the articular step-off and displacement of the fracture I recommended proceeding with open reduction internal fixation.  Risks and benefits were discussed with the patient.  Risks included but not limited to bleeding, infection, malunion, nonunion, hardware failure, hardware irritation, nerve or blood vessel injury, posttraumatic arthritis of the subtalar  joint, DVT, even the possibility anesthetic complications.  She agreed to proceed with surgery and consent was obtained.  Operative Findings: Open reduction internal fixation Sanders 2 right calcaneus fracture using [employer] percutaneous 2.7 mm calcaneus plate  Procedure: The patient was identified in the preoperative holding area. Consent was confirmed with the patient and their family and all questions were answered. The operative extremity was marked after confirmation with the patient. she was then brought back to the operating room by our anesthesia colleagues.  She was carefully transferred over to a radiolucent flat top table.  She was placed under general anesthetic.  A bump was placed under her operative hip.  A nonsterile tourniquet was placed to her upper thigh.  The right lower extremity was then prepped and draped in usual sterile fashion.  Timeout was performed to verify the patient, the procedure, and the extremity.  Preoperative antibiotics were dosed.  Fluoroscopic imaging was obtained of sinus tarsi incision was marked out.  Tourniquet was then inflated to 250 mmHg.  Total tourniquet time as noted above.  I made a standard sinus tarsi approach to the posterior facet of the calcaneus.  I carried down through skin and subcutaneous tissue.  I released the extensor digitorum brevis off of the anterior process of the calcaneus.  I then exposed the subtalar joint.  Here encountered the articular fracture.  The posterior facet laterally was displaced and rotated down.  I used a freer elevator to we reduced this anatomically.  I compressed the fracture using a ball spike pusher and  then proceeded to hold it provisionally with 1.1 mm K wires.  Using a Broden's view I confirmed that the articular surface was anatomic.  I then percutaneously placed a Schanz pin in the tuberosity to correct the coronal alignment and get the patient out of varus.  I used a Cobb elevator to develop a plane  between the peroneal tendons and the tuberosity for placement of the plate.  I then excised the plate and shows a medium [employer]hew 2.7 mm percutaneous calcaneus plate.  This was slid underneath the peroneals and held provisionally with a K wire.  I confirmed placement with fluoroscopy and then proceeded to drill and placed a nonlocking screw in the posterior facet to bring the plate flush to bone.  I then another nonlocking screw was placed into the anterior process to bring the plate flush to bone.  Lastly a percutaneous incision was made at the posterior aspect of the plate to place a nonlocking screw to suck the plate down to the tuberosity and to hold the coronal alignment.  Once I was pleased with the position of the screw I then removed the Schanz pin and I proceeded to place locking screws in the posterior facet and constant fragment, the anterior process, and the tuberosity.  Final fluoroscopic imaging was obtained.  I visualized the articular surface which was anatomic.  The incision was copiously irrigated.  A gram of vancomycin powder was placed into the incision.  Layered closure of 2-0 Vicryl and 3-0 nylon was used to close the skin.  Sterile dressing is applied and a well-padded short leg splint was placed.  Patient was then awoken from anesthesia and taken to the PACU in stable condition.  Post Op Plan/Instructions: Patient be nonweightbearing to right lower extremity.  She will receive postoperative aspirin for DVT prophylaxis.  She will be discharged home from the PACU and follow-up in 2 weeks for x-rays and suture removal.  I was present and performed the entire surgery.  [name], PA-C did assist me throughout the case. An assistant was necessary given the difficulty in approach, maintenance of reduction and ability to instrument the fracture.   Katha Hamming, MD [facility]

## 2021-07-15 NOTE — Anesthesia Procedure Notes (Signed)
Procedure Name: Intubation Date/Time: 07/15/2021 7:42 AM Performed by: Moshe Salisbury, CRNA Pre-anesthesia Checklist: Patient identified, Emergency Drugs available, Suction available and Patient being monitored Patient Re-evaluated:Patient Re-evaluated prior to induction Oxygen Delivery Method: Circle System Utilized Preoxygenation: Pre-oxygenation with 100% oxygen Induction Type: IV induction Ventilation: Mask ventilation without difficulty Laryngoscope Size: Mac and 3 Grade View: Grade II Tube type: Oral Tube size: 7.5 mm Number of attempts: 1 Airway Equipment and Method: Stylet Placement Confirmation: ETT inserted through vocal cords under direct vision, positive ETCO2 and breath sounds checked- equal and bilateral Secured at: 21 cm Tube secured with: Tape Dental Injury: Teeth and Oropharynx as per pre-operative assessment

## 2021-07-15 NOTE — Transfer of Care (Signed)
Immediate Anesthesia Transfer of Care Note  Patient: Michele Maldonado  Procedure(s) Performed: OPEN REDUCTION INTERNAL FIXATION (ORIF) CALCANEOUS FRACTURE (Right: Foot)  Patient Location: PACU  Anesthesia Type:GA combined with regional for post-op pain  Level of Consciousness: drowsy and patient cooperative  Airway & Oxygen Therapy: Patient Spontanous Breathing and Patient connected to nasal cannula oxygen  Post-op Assessment: Report given to RN, Post -op Vital signs reviewed and stable and Patient moving all extremities  Post vital signs: Reviewed and stable  Last Vitals:  Vitals Value Taken Time  BP 105/56 07/15/21 0914  Temp    Pulse 74 07/15/21 0915  Resp 15 07/15/21 0915  SpO2 100 % 07/15/21 0915  Vitals shown include unvalidated device data.  Last Pain:  Vitals:   07/15/21 0618  TempSrc: Oral         Complications: No notable events documented.

## 2021-07-15 NOTE — Anesthesia Procedure Notes (Signed)
Anesthesia Regional Block: Adductor canal block   Pre-Anesthetic Checklist: , timeout performed,  Correct Patient, Correct Site, Correct Laterality,  Correct Procedure, Correct Position, site marked,  Risks and benefits discussed,  Surgical consent,  Pre-op evaluation,  At surgeon's request and post-op pain management  Laterality: Right  Prep: chloraprep       Needles:  Injection technique: Single-shot  Needle Type: Echogenic Stimulator Needle     Needle Length: 10cm  Needle Gauge: 20     Additional Needles:   Narrative:  Start time: 07/15/2021 7:00 AM End time: 07/15/2021 7:05 AM Injection made incrementally with aspirations every 5 mL.  Performed by: Personally  Anesthesiologist: Mellody Dance, MD  Additional Notes: A functioning IV was confirmed and monitors were applied.  Sterile prep and drape, hand hygiene and sterile gloves were used.  Negative aspiration and test dose prior to incremental administration of local anesthetic. The patient tolerated the procedure well.Ultrasound  guidance: relevant anatomy identified, needle position confirmed, local anesthetic spread visualized around nerve(s), vascular puncture avoided.  Image printed for medical record.

## 2021-07-16 ENCOUNTER — Encounter (HOSPITAL_COMMUNITY): Payer: Self-pay | Admitting: Student

## 2021-07-16 DIAGNOSIS — Z419 Encounter for procedure for purposes other than remedying health state, unspecified: Secondary | ICD-10-CM | POA: Diagnosis not present

## 2021-07-16 NOTE — Anesthesia Postprocedure Evaluation (Signed)
Anesthesia Post Note  Patient: Michele Maldonado  Procedure(s) Performed: OPEN REDUCTION INTERNAL FIXATION (ORIF) CALCANEOUS FRACTURE (Right: Foot)     Patient location during evaluation: PACU Anesthesia Type: Regional and General Level of consciousness: awake and alert Pain management: pain level controlled Vital Signs Assessment: post-procedure vital signs reviewed and stable Respiratory status: spontaneous breathing, nonlabored ventilation and respiratory function stable Cardiovascular status: blood pressure returned to baseline and stable Postop Assessment: no apparent nausea or vomiting Anesthetic complications: no   No notable events documented.  Last Vitals:  Vitals:   07/15/21 0945 07/15/21 1000  BP: 108/64 106/74  Pulse: 74 62  Resp: 13 13  Temp: 36.5 C   SpO2: 100% 100%    Last Pain:  Vitals:   07/15/21 1000  TempSrc:   PainSc: 0-No pain                 Mellody Dance

## 2021-07-28 DIAGNOSIS — S92001D Unspecified fracture of right calcaneus, subsequent encounter for fracture with routine healing: Secondary | ICD-10-CM | POA: Diagnosis not present

## 2021-07-28 DIAGNOSIS — S92011A Displaced fracture of body of right calcaneus, initial encounter for closed fracture: Secondary | ICD-10-CM | POA: Diagnosis not present

## 2021-08-15 DIAGNOSIS — Z419 Encounter for procedure for purposes other than remedying health state, unspecified: Secondary | ICD-10-CM | POA: Diagnosis not present

## 2021-08-25 DIAGNOSIS — S92001D Unspecified fracture of right calcaneus, subsequent encounter for fracture with routine healing: Secondary | ICD-10-CM | POA: Diagnosis not present

## 2021-09-15 DIAGNOSIS — Z419 Encounter for procedure for purposes other than remedying health state, unspecified: Secondary | ICD-10-CM | POA: Diagnosis not present

## 2021-10-15 DIAGNOSIS — Z419 Encounter for procedure for purposes other than remedying health state, unspecified: Secondary | ICD-10-CM | POA: Diagnosis not present

## 2021-11-10 DIAGNOSIS — S92001D Unspecified fracture of right calcaneus, subsequent encounter for fracture with routine healing: Secondary | ICD-10-CM | POA: Diagnosis not present

## 2021-11-15 DIAGNOSIS — Z419 Encounter for procedure for purposes other than remedying health state, unspecified: Secondary | ICD-10-CM | POA: Diagnosis not present

## 2021-12-16 DIAGNOSIS — Z419 Encounter for procedure for purposes other than remedying health state, unspecified: Secondary | ICD-10-CM | POA: Diagnosis not present

## 2022-01-02 ENCOUNTER — Emergency Department (HOSPITAL_COMMUNITY)
Admission: EM | Admit: 2022-01-02 | Discharge: 2022-01-13 | Disposition: E | Payer: Medicaid Other | Attending: Emergency Medicine | Admitting: Emergency Medicine

## 2022-01-02 DIAGNOSIS — S0990XA Unspecified injury of head, initial encounter: Secondary | ICD-10-CM | POA: Diagnosis present

## 2022-01-02 DIAGNOSIS — W3400XA Accidental discharge from unspecified firearms or gun, initial encounter: Secondary | ICD-10-CM | POA: Insufficient documentation

## 2022-01-02 DIAGNOSIS — R0603 Acute respiratory distress: Secondary | ICD-10-CM | POA: Insufficient documentation

## 2022-01-02 DIAGNOSIS — I469 Cardiac arrest, cause unspecified: Secondary | ICD-10-CM | POA: Diagnosis not present

## 2022-01-02 DIAGNOSIS — S0180XA Unspecified open wound of other part of head, initial encounter: Secondary | ICD-10-CM | POA: Insufficient documentation

## 2022-01-02 DIAGNOSIS — T1490XA Injury, unspecified, initial encounter: Secondary | ICD-10-CM | POA: Diagnosis not present

## 2022-01-02 DIAGNOSIS — R404 Transient alteration of awareness: Secondary | ICD-10-CM | POA: Diagnosis not present

## 2022-01-02 DIAGNOSIS — R000 Tachycardia, unspecified: Secondary | ICD-10-CM | POA: Diagnosis not present

## 2022-01-02 DIAGNOSIS — Z743 Need for continuous supervision: Secondary | ICD-10-CM | POA: Diagnosis not present

## 2022-01-02 DIAGNOSIS — R0689 Other abnormalities of breathing: Secondary | ICD-10-CM | POA: Diagnosis not present

## 2022-01-13 DIAGNOSIS — Z419 Encounter for procedure for purposes other than remedying health state, unspecified: Secondary | ICD-10-CM | POA: Diagnosis not present
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
## 2022-01-13 NOTE — Progress Notes (Signed)
°   01/13/22 0700  Clinical Encounter Type  Visited With Family  Visit Type Follow-up  Referral From Other (Comment)  Consult/Referral To Chaplain   Patient's family contact information received from Ms Ashley Murrain. Patient's Mother: Gae Gallop, [phone] Patient's Sister:   Scarlette Shorts, [phone]
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
## 2022-01-13 DEATH — deceased
